# Patient Record
Sex: Female | Born: 2000 | Hispanic: Yes | Marital: Single | Smoking: Never smoker
Health system: Southern US, Community
[De-identification: ages and names within clinical notes are randomized; demographics above are authoritative.]

## PROBLEM LIST (undated history)

## (undated) DIAGNOSIS — F909 Attention-deficit hyperactivity disorder, unspecified type: Secondary | ICD-10-CM

## (undated) DIAGNOSIS — T7840XA Allergy, unspecified, initial encounter: Secondary | ICD-10-CM

## (undated) DIAGNOSIS — F845 Asperger's syndrome: Secondary | ICD-10-CM

## (undated) DIAGNOSIS — F209 Schizophrenia, unspecified: Secondary | ICD-10-CM

## (undated) HISTORY — DX: Morbid (severe) obesity due to excess calories: E66.01

## (undated) HISTORY — DX: Asperger's syndrome: F84.5

## (undated) HISTORY — DX: Allergy, unspecified, initial encounter: T78.40XA

## (undated) HISTORY — DX: Schizophrenia, unspecified: F20.9

---
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
## 2020-01-05 ENCOUNTER — Other Ambulatory Visit: Payer: Self-pay

## 2020-01-05 ENCOUNTER — Ambulatory Visit (INDEPENDENT_AMBULATORY_CARE_PROVIDER_SITE_OTHER): Payer: Medicaid Other | Admitting: Family Medicine

## 2020-01-05 VITALS — Wt 215.0 lb

## 2020-01-05 DIAGNOSIS — F900 Attention-deficit hyperactivity disorder, predominantly inattentive type: Secondary | ICD-10-CM | POA: Diagnosis not present

## 2020-01-05 NOTE — Progress Notes (Signed)
Established Patient Office Visit  Subjective:  Patient ID: Felicia Martinez, female    DOB: Jun 05, 2001  Age: 19 y.o. MRN: [id number]  CC:  Chief Complaint  Patient presents with  . Referral    ADHD    HPI  Patient is a very pleasant 19 year old Hispanic female who presents today requesting a referral to a psychiatrist for ADHD.  She states that she has had trouble with focus her entire life.  She states that while she was in school she would become easily distracted.  She was spent most of her time in class daydreaming.  She would have a very difficult time paying attention to anything except items she was interested in.  Classes such as chemistry or English she found impossible to focus.  She also states that she would frequently interrupt her friends while they were talking.  She had a very difficult time waiting her turn.  She would interrupt the conversation and was unable to focus on what they were saying and would become distracted if she was not engrossed in the conversation.  She reports trouble with organization.  This morning she was almost late to her appointment because she had lost her wallet and her keys.  She frequently misplaces items.  She has a difficult time sustaining focus in task that required prolonged attention.  When she was younger she had trouble with hyperactivity.  She has difficult time sitting still.  She would often be in trouble for talking during class.  She would often raise her hand and blurt out answers without being called.  As she has matured, the hyperactivity has improved although she does report trouble controlling her energy at time.  Previously she has been diagnosed with schizophrenia however she questions this diagnosis.  She denies any history of auditory or visual hallucinations or delusions or paranoia.  She is seeing a therapist for bipolar disorder.  Her symptoms primarily included depression although she did have trouble with impulsivity and  insomnia and mood swings. Past Medical History:  Diagnosis Date  . Allergy   . Asthma   . Morbid obesity (HCC)   . 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  Family History  Problem Relation Age of Onset  . Hypertension Mother   . Lung cancer Maternal Grandmother     Social History   Socioeconomic History  . Marital status: Single    Spouse name: Not on file  . Number of children: Not on file  . Years of education: Not on file  . Highest education level: Not on file  Occupational History  . Not on file  Tobacco Use  . Smoking status: Never Smoker  . Smokeless tobacco: Never Used  Vaping Use  . Vaping Use: Never used  Substance and Sexual Activity  . Alcohol use: Yes    Comment: occasional  . Drug use: No  . Sexual activity: Yes    Birth control/protection: Condom    Comment: menarch age 12.  Lives with mom.  Parents separated.  Other Topics Concern  . Not on file  Social History Narrative   In 9th grade at [employer]. She is performing good in school.  No regular exercise.  More than 3 hours of screen time per day.  Enjoys watching netflix, writing poetry and talking to friends.    Social Determinants of Health   Financial Resource Strain:   . Difficulty of Paying Living Expenses:   Food  Insecurity: Food Insecurity Present  . Worried About Programme researcher, broadcasting/film/video in the Last Year: Sometimes true  . Ran Out of Food in the Last Year: Sometimes true  Transportation Needs: No Transportation Needs  . Lack of Transportation (Medical): No  . Lack of Transportation (Non-Medical): No  Physical Activity: Inactive  . Days of Exercise per Week: 0 days  . Minutes of Exercise per Session: 0 min  Stress: Stress Concern Present  . Feeling of Stress : Very much  Social Connections: Socially Isolated  . Frequency of Communication with Friends and Family: Twice a week  . Frequency of Social Gatherings with Friends and Family: Once a week  .  Attends Religious Services: Never  . Active Member of Clubs or Organizations: No  . Attends Banker Meetings: Never  . Marital Status: Never married  Intimate Partner Violence: Not At Risk  . Fear of Current or Ex-Partner: No  . Emotionally Abused: No  . Physically Abused: No  . Sexually Abused: No    Outpatient Medications Prior to Visit  Medication Sig Dispense Refill  . buPROPion (WELLBUTRIN XL) 150 MG 24 hr tablet Take 150 mg by mouth daily.    . Cholecalciferol (VITAMIN D) 50 MCG (2000 UT) CAPS Take by mouth.     . clindamycin (CLEOCIN T) 1 % external solution Apply topically 2 (two) times daily. **NEED OFFICE APPT FOR FURTHER REFILLS** 30 mL 0  . fluticasone (FLONASE) 50 MCG/ACT nasal spray Place 2 sprays into both nostrils daily. 16 g 6  . LAMICTAL 100 MG tablet Take 50 mg by mouth daily.     Facility-Administered Medications Prior to Visit  Medication Dose Route Frequency Provider Last Rate Last Admin  . medroxyPROGESTERone (DEPO-PROVERA) injection 150 mg  150 mg Intramuscular Q90 days Donita Brooks, MD   150 mg at 04/27/19 0815    Allergies  Allergen Reactions  . Other     Seasonal Allergies-pollen    ROS Review of Systems  All other systems reviewed and are negative.     Objective:    Physical Exam Constitutional:      Appearance: She is well-developed.  HENT:     Head: Normocephalic.  Eyes:     Pupils: Pupils are equal, round, and reactive to light.  Cardiovascular:     Rate and Rhythm: Normal rate and regular rhythm.  Pulmonary:     Effort: Pulmonary effort is normal.     Breath sounds: Normal breath sounds.  Abdominal:     General: Bowel sounds are normal.     Palpations: Abdomen is soft.     Tenderness: There is no abdominal tenderness.        Assessment & Plan:   ADHD (attention deficit hyperactivity disorder), inattentive type  Patient symptoms certainly sound consistent with ADHD.  However given her complicated  psychiatric history with possible schizophrenia and bipolar disorder, I have recommended referral to a psychiatrist so that any treatment does not interfere with the treatment of her bipolar.  Therefore I will place a referral to psychiatry.   Donita Brooks, MD

## 2020-01-23 ENCOUNTER — Telehealth: Payer: Self-pay | Admitting: Family Medicine

## 2020-01-23 DIAGNOSIS — F330 Major depressive disorder, recurrent, mild: Secondary | ICD-10-CM | POA: Diagnosis not present

## 2020-01-23 NOTE — Telephone Encounter (Signed)
CB# [phone] Pt was seen on  01-05-20 Dr.Pickard going send over a referral to psychiatrist for ADHD haven't heard from anyone

## 2020-01-23 NOTE — Telephone Encounter (Signed)
Pt referral was placed with [facility] in [place]

## 2020-01-25 ENCOUNTER — Telehealth (HOSPITAL_COMMUNITY): Payer: Self-pay | Admitting: *Deleted

## 2020-01-25 NOTE — Telephone Encounter (Signed)
New patient referral received and staff called patient number on file and LMOM.

## 2020-03-07 ENCOUNTER — Encounter (HOSPITAL_COMMUNITY): Payer: Self-pay | Admitting: Psychiatry

## 2020-03-07 ENCOUNTER — Other Ambulatory Visit: Payer: Self-pay

## 2020-03-07 ENCOUNTER — Telehealth (INDEPENDENT_AMBULATORY_CARE_PROVIDER_SITE_OTHER): Payer: Medicaid Other | Admitting: Psychiatry

## 2020-03-07 DIAGNOSIS — G4700 Insomnia, unspecified: Secondary | ICD-10-CM

## 2020-03-07 DIAGNOSIS — R41840 Attention and concentration deficit: Secondary | ICD-10-CM

## 2020-03-07 DIAGNOSIS — F330 Major depressive disorder, recurrent, mild: Secondary | ICD-10-CM

## 2020-03-07 MED ORDER — BUPROPION HCL ER (XL) 150 MG PO TB24: 150.0000 mg | ORAL_TABLET | Freq: Every day | ORAL | 1 refills | Status: DC

## 2020-03-07 NOTE — Patient Instructions (Signed)
1. Start bupropion 150 mg daily  2. Referral for sleep evaluation 3. Check blood test (TSH), urine test (Urine drug screen) 4. Next appointment: 10/21 at 11 AM

## 2020-03-07 NOTE — Progress Notes (Signed)
Virtual Visit via Video Note  I connected with Felicia CairoEvelyn M Martinez on 03/07/20 at 10:40 AM EDT by a video enabled telemedicine application and verified that I am speaking with the correct person using two identifiers.   I discussed the limitations of evaluation and management by telemedicine and the availability of in person appointments. The patient expressed understanding and agreed to proceed.   I discussed the assessment and treatment plan with the patient. The patient was provided an opportunity to ask questions and all were answered. The patient agreed with the plan and demonstrated an understanding of the instructions.   The patient was advised to call back or seek an in-person evaluation if the symptoms worsen or if the condition fails to improve as anticipated.  Location: patient- home, provider- office   I provided 45 minutes of non-face-to-face time during this encounter.   Neysa Hottereina Jaydyn Menon, MD     Psychiatric Initial Adult Assessment   Patient Identification: Felicia Martinez MRN:  [id number] Date of Evaluation:  03/07/2020 Referral Source: Donita BrooksPickard, Warren T, MD Chief Complaint:  "It's hard to keep my head above water" Visit Diagnosis: No diagnosis found.  History of Present Illness:   Felicia Martinez is a 19 y.o. year old female with a history of bipolar disorder, who is referred for ADHD.   She states that "It's hard to keep my head above water."  She has had significant problems with focus and organization.  She has a day treatment while teacher is saying something.  She has had struggle to get motivation to do things.  She also states that she can still get overstimulated in class.  On the other hand, she tends to get very excited in certain topic, and becomes talkative, "hyper," and interrupts other people in group session, although she immediately apologizes about her behavior. She states that she has long history of "manic depression." Her medication was discontinued  about a month ago when she could not attend to the appointment. Although her medication was helpful to keep her "leveled," she recalls that she still had issues with concentration. Although her describes her self as "positive person" in nature and she knows that "there is hope for me," she feels stressed and anxious due to these issues. She reports good relationship with her boyfriend, and her parents (they are separated). She feels "bad" and denies recent hypomanic episode.   Depression- Although she denies feeling depressed, she reports mild anhedonia, and wants to stay in the bed at times.  She has low energy. She has initial and middle insomnia. She feels fatigue. She denies SI.   Bipolar- history of random burst of energy of clean the entire house in the house, it only lasted for a brief moment, she has impulsive shopping (to make her feel excited). She is talkative, and irritable at times.   ADHD- She is able to focus better on topics which she is interested. She was taking honors in high school despite issues with concentration. She denies IEP. Other symptoms as described above.   Medication- bupropion 150 mg daily, lamotrigine 50 mg daily (until last month)  Associated Signs/Symptoms: Depression Symptoms:  anhedonia, insomnia, fatigue, difficulty concentrating, (Hypo) Manic Symptoms:  Distractibility, Elevated Mood, Impulsivity, Irritable Mood, Anxiety Symptoms:  mild anxiety Psychotic Symptoms:  denies AH, VH, paranoia PTSD Symptoms: Negative  Past Psychiatric History:  [facility]patient[facility], last in January 2021, diagnosed with "manic depression" in middle school Psychiatry admission: denies  Previous suicide attempt: denies  Past trials  of medication: Abilify (weight gain) Trazodone  History of violence: denies   Previous Psychotropic Medications: Yes   Substance Abuse History in the last 12 months:  No.  Consequences of Substance Abuse: NA  Past Medical History:   Past Medical History:  Diagnosis Date  . Allergy   . Asthma   . Morbid obesity (HCC)   . 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  Family Psychiatric History:  As below  Family History:  Family History  Problem Relation Age of Onset  . Hypertension Mother   . Anxiety disorder Mother   . Depression Mother   . Lung cancer Maternal Grandmother   . Schizophrenia Maternal Aunt     Social History:   Social History   Socioeconomic History  . Marital status: Single    Spouse name: Not on file  . Number of children: Not on file  . Years of education: Not on file  . Highest education level: Not on file  Occupational History  . Not on file  Tobacco Use  . Smoking status: Never Smoker  . Smokeless tobacco: Never Used  Vaping Use  . Vaping Use: Never used  Substance and Sexual Activity  . Alcohol use: Yes    Comment: occasional  . Drug use: No  . Sexual activity: Yes    Birth control/protection: Condom    Comment: menarch age 75.  Lives with mom.  Parents separated.  Other Topics Concern  . Not on file  Social History Narrative   In 9th grade at [employer]. She is performing good in school.  No regular exercise.  More than 3 hours of screen time per day.  Enjoys watching netflix, writing poetry and talking to friends.    Social Determinants of Health   Financial Resource Strain:   . Difficulty of Paying Living Expenses: Not on file  Food Insecurity: Food Insecurity Present  . Worried About Programme researcher, broadcasting/film/video in the Last Year: Sometimes true  . Ran Out of Food in the Last Year: Sometimes true  Transportation Needs: No Transportation Needs  . Lack of Transportation (Medical): No  . Lack of Transportation (Non-Medical): No  Physical Activity: Inactive  . Days of Exercise per Week: 0 days  . Minutes of Exercise per Session: 0 min  Stress: Stress Concern Present  . Feeling of Stress : Very much  Social Connections: Socially Isolated   . Frequency of Communication with Friends and Family: Twice a week  . Frequency of Social Gatherings with Friends and Family: Once a week  . Attends Religious Services: Never  . Active Member of Clubs or Organizations: No  . Attends Banker Meetings: Never  . 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 Took honors in high school She grew up in [place], "she describes her childhood as "nice childhood though poor." Her parents are from [place] and separated. She has good relationship with both of her parents  Allergies:   Allergies  Allergen Reactions  . Other     Seasonal Allergies-pollen    Metabolic Disorder Labs: Lab Results  Component Value Date  HGBA1C 5.6 10/18/2019   MPG 114 10/18/2019   Lab Results  Component Value Date   PROLACTIN 8.8 01/17/2015   Lab Results  Component Value Date   CHOL 169 10/18/2019   TRIG 153 (H) 10/18/2019   HDL 44 (L) 10/18/2019   CHOLHDL 3.8 10/18/2019   VLDL 35 (H) 08/02/2015   LDLCALC 100 10/18/2019   LDLCALC 110 (H) 08/02/2015   Lab Results  Component Value Date   TSH 1.067 01/17/2015    Therapeutic Level Labs: No results found for: LITHIUM No results found for: CBMZ No results found for: VALPROATE  Current Medications: Current Outpatient Medications  Medication Sig Dispense Refill  . buPROPion (WELLBUTRIN XL) 150 MG 24 hr tablet Take 150 mg by mouth daily.    . Cholecalciferol (VITAMIN D) 50 MCG (2000 UT) CAPS Take by mouth.     . clindamycin (CLEOCIN T) 1 % external solution Apply topically 2 (two) times daily. **NEED OFFICE APPT FOR  FURTHER REFILLS** 30 mL 0  . fluticasone (FLONASE) 50 MCG/ACT nasal spray Place 2 sprays into both nostrils daily. 16 g 6  . LAMICTAL 100 MG tablet Take 50 mg by mouth daily.     Current Facility-Administered Medications  Medication Dose Route Frequency Provider Last Rate Last Admin  . medroxyPROGESTERone (DEPO-PROVERA) injection 150 mg  150 mg Intramuscular Q90 days Donita Brooks, MD   150 mg at 04/27/19 0815    Musculoskeletal: Strength & Muscle Tone: N/A Gait & Station: N/A Patient leans: N/A  Psychiatric Specialty Exam: Review of Systems  Psychiatric/Behavioral: Positive for decreased concentration and sleep disturbance. Negative for agitation, behavioral problems, confusion, dysphoric mood, hallucinations, self-injury and suicidal ideas. The patient is nervous/anxious. The patient is not hyperactive.   All other systems reviewed and are negative.   There were no vitals taken for this visit.There is no height or weight on file to calculate BMI.  General Appearance: Fairly Groomed  Eye Contact:  Good  Speech:  Clear and Coherent  Volume:  Normal  Mood:  "bad"  Affect:  Appropriate, Congruent and euthymic  Thought Process:  Coherent  Orientation:  Full (Time, Place, and Person)  Thought Content:  Logical  Suicidal Thoughts:  No  Homicidal Thoughts:  No  Memory:  Immediate;   Good  Judgement:  Good  Insight:  Good  Psychomotor Activity:  Normal  Concentration:  Concentration: Good and Attention Span: Good  Recall:  Good  Fund of Knowledge:Good  Language: Good  Akathisia:  No  Handed:  Right  AIMS (if indicated):  not done  Assets:  Communication Skills Desire for Improvement  ADL's:  Intact  Cognition: WNL  Sleep:  Poor   Screenings: GAD-7     Office Visit from 12/21/2019 in [facility]  Total GAD-7 Score 5    PHQ2-9     Office Visit from 12/21/2019 in [facility] Office Visit from 08/29/2018 in [facility] Office Visit  from 08/27/2017 in [facility]  PHQ-2 Total Score 2 0 2  PHQ-9 Total Score 14 -- 5      Assessment and Plan:  SAIDA LONON is a 19 y.o. year old female with a history of bipolar disorder, who is referred for ADHD.   1. MDD (major depressive disorder), recurrent episode, mild (HCC) # r/o bipolar II disorder She reports anhedonia with low energy since discontinuation of lamotrigine and bupropion. Psychosocial stressors includes school.  Will reinitiate bupropion to target depression.  Discussed potential risk of headache, and medication induced mania.  She agrees to contact the clinic if she experiences any side effect.  Noted that she only has subthreshold hypomanic symptoms, which could be more attributable to manic defense rather than bipolar disorder.  Will continue to monitor. Will check TSH to rule out medical cause contributing to her mood symptoms.   2. Inattention Although she does have features of ADHD, it could be multifactorial given her mood symptoms and insomnia.  She agrees to first optimize treatment for depression as described above.  She agrees to obtain UDS in preparation for potential treatment with stimulant in the future.   3. Insomnia, unspecified type She reports initial and middle insomnia, fatigue and snoring at night.  Will make referral for sleep evaluation.   Plan 1. Start bupropion 150 mg daily  2. Referral for sleep evaluation 3. Check TSH,  UDS 4. Next appointment: 10/21 at 11 AM for 30 mins, video  The patient demonstrates the following risk factors for suicide: Chronic risk factors for suicide include: psychiatric disorder of depression. Acute risk factors for suicide include: N/A. Protective factors for this patient include: positive social support, coping skills and hope for the future. Considering these factors, the overall suicide risk at this point appears to be low. Patient is appropriate for outpatient follow up.   Neysa Hotter,  MD 9/9/202111:14 AM

## 2020-03-08 LAB — DRUG MONITOR, PANEL 1, SCREEN, URINE
Amphetamines: NEGATIVE ng/mL (ref <500)
Barbiturates: NEGATIVE ng/mL (ref <300)
Benzodiazepines: NEGATIVE ng/mL (ref <100)
Cocaine Metabolite: NEGATIVE ng/mL (ref <150)
Creatinine: 156.5 mg/dL
Marijuana Metabolite: NEGATIVE ng/mL (ref <20)
Methadone Metabolite: NEGATIVE ng/mL (ref <100)
Opiates: NEGATIVE ng/mL (ref <100)
Oxidant: NEGATIVE ug/mL
Oxycodone: NEGATIVE ng/mL (ref <100)
Phencyclidine: NEGATIVE ng/mL (ref <25)
pH: 5.5 (ref 4.5–9.0)

## 2020-03-08 LAB — TSH: TSH: 1.3 mIU/L

## 2020-03-08 LAB — DM TEMPLATE

## 2020-03-11 ENCOUNTER — Encounter (HOSPITAL_COMMUNITY): Payer: Self-pay | Admitting: Psychiatry

## 2020-04-11 ENCOUNTER — Ambulatory Visit: Payer: Medicaid Other | Admitting: Neurology

## 2020-04-11 ENCOUNTER — Other Ambulatory Visit: Payer: Self-pay

## 2020-04-11 ENCOUNTER — Encounter: Payer: Self-pay | Admitting: Neurology

## 2020-04-11 VITALS — BP 106/78 | HR 99 | Ht 61.0 in | Wt 218.0 lb

## 2020-04-11 DIAGNOSIS — R5383 Other fatigue: Secondary | ICD-10-CM | POA: Diagnosis not present

## 2020-04-11 DIAGNOSIS — R0683 Snoring: Secondary | ICD-10-CM | POA: Diagnosis not present

## 2020-04-11 DIAGNOSIS — R519 Headache, unspecified: Secondary | ICD-10-CM

## 2020-04-11 DIAGNOSIS — G4700 Insomnia, unspecified: Secondary | ICD-10-CM

## 2020-04-11 NOTE — Patient Instructions (Signed)

## 2020-04-11 NOTE — Progress Notes (Signed)
Virtual Visit via Video Note  I connected with Felicia Martinez on 04/18/20 at 11:00 AM EDT by a video enabled telemedicine application and verified that I am speaking with the correct person using two identifiers.  Location: Patient: home Provider: office   I discussed the limitations of evaluation and management by telemedicine and the availability of in person appointments. The patient expressed understanding and agreed to proceed.    I discussed the assessment and treatment plan with the patient. The patient was provided an opportunity to ask questions and all were answered. The patient agreed with the plan and demonstrated an understanding of the instructions.   The patient was advised to call back or seek an in-person evaluation if the symptoms worsen or if the condition fails to improve as anticipated.  I provided 22 minutes of non-face-to-face time during this encounter.   Neysa Hotter, MD    [facility] MD/PA/NP OP Progress Note  04/18/2020 11:31 AM Felicia Martinez  MRN:  [id number]  Chief Complaint:  Chief Complaint    Depression; Follow-up     HPI:  This is a follow-up appointment for depression.  She states that she noticed she is not sad, or emotional so much since starting bupropion.  Although she used to think that she has no friends, she notices that she actually does have a friend, although she does not have many.  She states that her boyfriend is very understanding, and has been helpful to the patient.  She has been stressed as she is trying to figure out which classes to take with the plan to transfer to [place].  She has middle insomnia.  She has mild anhedonia.  She feels slightly depressed.  She denies SI.  She has difficulty in concentration.  She has difficulty keeping track of the time, and has been late for school. She forgets whether she took medication, and she will ask her boyfriend. She is unable to sustain attention, and "zone out"during conversation.  She tends to interrupt other people. She denies decreased need for sleep, euphoria or increased goal directed activity. She drinks energy drink, which calms her down (she agrees to hold this while trying Concerta). She denies alcohol use or drug use.     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 Hope to become a Runner, broadcasting/film/video. Took honors in high school She grew up in [place], "she describes her childhood as "nice childhood though poor." Her parents are from [place] and separated. She has good relationship with both of her parents  Visit Diagnosis:    ICD-10-CM   1. MDD (major depressive disorder), recurrent episode, mild (HCC)  F33.0   2. Attention deficit hyperactivity disorder (ADHD), unspecified ADHD type  F90.9     Past Psychiatric History: Please see initial evaluation for full details. I have reviewed the history. No updates at this time.     Past Medical History:  Past Medical History:  Diagnosis Date  . Allergy   . Asthma   . Morbid obesity (HCC)   . 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  Family Psychiatric History: Please see initial evaluation for full details. I have reviewed the history. No updates at this time.  Family History:  Family History  Problem Relation Age of Onset  . Hypertension Mother   . Anxiety disorder Mother   . Depression Mother   . Lung cancer Maternal Grandmother   . Schizophrenia Maternal Aunt     Social History:  Social History   Socioeconomic History  . Marital status: Single    Spouse name: Not on file  . Number of children: Not on file  . Years of education: Not on file  . Highest education level: Not on file   Occupational History  . Not on file  Tobacco Use  . Smoking status: Never Smoker  . Smokeless tobacco: Never Used  Vaping Use  . Vaping Use: Never used  Substance and Sexual Activity  . Alcohol use: Yes    Comment: occasional  . Drug use: No  . Sexual activity: Yes    Birth control/protection: Condom    Comment: menarch age 58.  Lives with mom.  Parents separated.  Other Topics Concern  . Not on file  Social History Narrative   In 9th grade at [employer]. She is performing good in school.  No regular exercise.  More than 3 hours of screen time per day.  Enjoys watching netflix, writing poetry and talking to friends.    Social Determinants of Health   Financial Resource Strain:   . Difficulty of Paying Living Expenses: Not on file  Food Insecurity: Food Insecurity Present  . Worried About Programme researcher, broadcasting/film/video in the Last Year: Sometimes true  . Ran Out of Food in the Last Year: Sometimes true  Transportation Needs: No Transportation Needs  . Lack of Transportation (Medical): No  . Lack of Transportation (Non-Medical): No  Physical Activity: Inactive  . Days of Exercise per Week: 0 days  . Minutes of Exercise per Session: 0 min  Stress: Stress Concern Present  . Feeling of Stress : Very much  Social Connections: Socially Isolated  . Frequency of Communication with Friends and Family: Twice a week  . Frequency of Social Gatherings with Friends and Family: Once a week  . Attends Religious Services: Never  . Active Member of Clubs or Organizations: No  . Attends Banker Meetings: Never  . Marital Status: Never married    Allergies:  Allergies  Allergen Reactions  . Other     Seasonal Allergies-pollen    Metabolic Disorder Labs: Lab Results  Component Value Date   HGBA1C 5.6 10/18/2019   MPG 114 10/18/2019   Lab Results  Component Value Date   PROLACTIN 8.8 01/17/2015   Lab Results  Component Value Date   CHOL 169 10/18/2019   TRIG  153 (H) 10/18/2019   HDL 44 (L) 10/18/2019   CHOLHDL 3.8 10/18/2019   VLDL 35 (H) 08/02/2015   LDLCALC 100 10/18/2019   LDLCALC 110 (H) 08/02/2015   Lab Results  Component Value Date   TSH 1.30 03/07/2020   TSH 1.067 01/17/2015    Therapeutic Level Labs: No results found for: LITHIUM No results found for: VALPROATE No components found for:  CBMZ  Current Medications: Current Outpatient Medications  Medication Sig Dispense Refill  . [START ON 05/05/2020] buPROPion (WELLBUTRIN XL) 150 MG 24 hr tablet Take 1 tablet (150 mg total) by mouth daily. 30 tablet 0  . fluticasone (FLONASE) 50 MCG/ACT nasal spray Place 2 sprays into both nostrils daily. 16 g 6  . methylphenidate (CONCERTA) 18 MG PO CR tablet Take 1 tablet (18 mg total) by mouth  daily. 30 tablet 0   Current Facility-Administered Medications  Medication Dose Route Frequency Provider Last Rate Last Admin  . medroxyPROGESTERone (DEPO-PROVERA) injection 150 mg  150 mg Intramuscular Q90 days Donita BrooksPickard, Warren T, MD   150 mg at 04/27/19 0815     Musculoskeletal: Strength & Muscle Tone: N/A Gait & Station: N/A Patient leans: N/A  Psychiatric Specialty Exam: Review of Systems  Psychiatric/Behavioral: Positive for decreased concentration, dysphoric mood and sleep disturbance. Negative for agitation, behavioral problems, confusion, hallucinations, self-injury and suicidal ideas. The patient is not nervous/anxious and is not hyperactive.   All other systems reviewed and are negative.   There were no vitals taken for this visit.There is no height or weight on file to calculate BMI.  General Appearance: Casual  Eye Contact:  Good  Speech:  Clear and Coherent  Volume:  Normal  Mood:  "same"  Affect:  Appropriate, Congruent and euthymic  Thought Process:  Coherent  Orientation:  Full (Time, Place, and Person)  Thought Content: Logical   Suicidal Thoughts:  No  Homicidal Thoughts:  No  Memory:  Immediate;   Good  Judgement:   Good  Insight:  Good  Psychomotor Activity:  Normal  Concentration:  Concentration: Good and Attention Span: Good  Recall:  Good  Fund of Knowledge: Good  Language: Good  Akathisia:  No  Handed:  Right  AIMS (if indicated): not done  Assets:  Communication Skills Desire for Improvement  ADL's:  Intact  Cognition: WNL  Sleep:  Fair   Screenings: GAD-7     [facility] Visit [facility]  Total GAD-7 Score 5    PHQ2-9     Office Visit [facility]/21/2019 in [facility] Office Visit from [facility]/29/2018 in [facility]  PHQ-2 Total Score 2 0 2  PHQ-9 Total Score 14 -- 5       Assessment and Plan:  Felicia Martinez is a 19 y.o. year old female with a history of bipolar disorder, who presents for follow up appointment for below.   1. MDD (major depressive disorder), recurrent episode, mild (HCC) She reports overall improvement in depressive symptoms, although she continues to have anhedonia since starting bupropion.  Psychosocial stressors include school.  Given that her main source of stress is attributable to inattention, will try a stimulant as below to see if her mood improves as her inattention improves.  Will continue bupropion to target depression.   2. Attention deficit hyperactivity disorder (ADHD), unspecified ADHD type She has symptoms of ADHD, which is refractory to improvement despite some improvement in her mood symptoms.  Will try low dose of Concerta to target ADHD.  Discussed potential risks, which includes but not limited to decreased appetite, headache, insomnia, palpitation especially with concomitant use of bupropion.   3. Insomnia, unspecified type She is seen by neurologist for evaluation of sleep apnea.  Pending sleep study.   Plan 1. Continue bupropion 150 mg daily  2. Start concerta 18 mg daily  3. Next appointment: 11/16 at 3 PM for 30 mins, video - on  melatonin 5 mg at night   Past trials of medication: Abilify (weight gain) Trazodone   The patient demonstrates the following risk factors for suicide: Chronic risk factors for suicide include: psychiatric disorder of depression. Acute risk factors for suicide include: N/A. Protective factors for this patient include: positive social support, coping skills and hope for the  future. Considering these factors, the overall suicide risk at this point appears to be low. Patient is appropriate for outpatient follow up  Neysa Hotter, MD 04/18/2020, 11:31 AM

## 2020-04-11 NOTE — Progress Notes (Signed)
Subjective:    Patient ID: Felicia Martinez is a 19 y.o. female.  HPI     Huston Foley, MD, PhD [facility] [address], [address] [address] [place], [place] [address]  Dear Dr. Vanetta Shawl,  I saw your patient, Felicia Martinez, upon your kind request in my sleep clinic today for initial consultation of her sleep disorder, in particular, concern for underlying obstructive sleep apnea.  The patient is unaccompanied today.  As you know, Ms. Felicia Martinez is a 19 year old right-handed woman with an underlying medical history of asthma, allergies, hypertriglyceridemia, ADHD, depression, and severe obesity with a BMI of over 40, who reports snoring and excessive daytime somnolence, difficulty initiating and maintaining sleep. I reviewed your virtual visit office note from 03/07/2020.  Her Epworth sleepiness score is 4 out of 24, fatigue severity score is 43 out of 63.  She reports a longer standing history of difficulty initiating and maintaining sleep.  In the past few months her difficulty has become worse.  She has also gained weight in the last year, she was on Depo injections and gained quite a bit of weight, she is off of the injection.  She lives with her boyfriend and with her mother.  Felicia Martinez snores and has difficulty with insomnia.  The patient started melatonin some 3 to 4 weeks ago and started taking the Gummies which were effective but she started having some abnormal dreams and nightmares.  She since then switched to a 5 mg melatonin pill which is not as effective but she does not have any abnormal dreams.  She tries to be in bed between 930 and 10 but is typically not asleep until midnight.  Most days she has to be up by 8 and on Fridays she can sleep then.  She is currently in [employer].  She is a non-smoker and drinks alcohol rarely.  She does not drink caffeine on a day-to-day basis, occasional soda and coffee every other day.  She does snore.  She has woken  up with the occasional headache which feels like a dull or heavy feeling.  She does not have night to night nocturia.  She is a restless sleeper and does not feel tired during the day and sometimes has trouble focusing.   Her Past Medical History Is Significant For: Past Medical History:  Diagnosis Date  . Allergy   . Asthma   . Morbid obesity (HCC)   . Schizophrenia in children     Her Past Surgical History Is Significant For: History reviewed. No pertinent surgical history.  Her Family History Is Significant For: Family History  Problem Relation Age of Onset  . Hypertension Mother   . Anxiety disorder Mother   . Depression Mother   . Lung cancer Maternal Grandmother   . Schizophrenia Maternal Aunt     Her Social History Is Significant For: Social History   Socioeconomic History  . Marital status: Single    Spouse name: Not on file  . Number of children: Not on file  . Years of education: Not on file  . Highest education level: Not on file  Occupational History  . Not on file  Tobacco Use  . Smoking status: Never Smoker  . Smokeless tobacco: Never Used  Vaping Use  . Vaping Use: Never used  Substance and Sexual Activity  . Alcohol use: Yes    Comment: occasional  . Drug use: No  . Sexual activity: Yes    Birth control/protection: Condom  Comment: menarch age 18.  Lives with Felicia Martinez.  Parents separated.  Other Topics Concern  . Not on file  Social History Narrative   In 9th grade at [employer]. She is performing good in school.  No regular exercise.  More than 3 hours of screen time per day.  Enjoys watching netflix, writing poetry and talking to friends.    Social Determinants of Health   Financial Resource Strain:   . Difficulty of Paying Living Expenses: Not on file  Food Insecurity: Food Insecurity Present  . Worried About Programme researcher, broadcasting/film/video in the Last Year: Sometimes true  . Ran Out of Food in the Last Year: Sometimes true  Transportation  Needs: No Transportation Needs  . Lack of Transportation (Medical): No  . Lack of Transportation (Non-Medical): No  Physical Activity: Inactive  . Days of Exercise per Week: 0 days  . Minutes of Exercise per Session: 0 min  Stress: Stress Concern Present  . Feeling of Stress : Very much  Social Connections: Socially Isolated  . Frequency of Communication with Friends and Family: Twice a week  . Frequency of Social Gatherings with Friends and Family: Once a week  . Attends Religious Services: Never  . Active Member of Clubs or Organizations: No  . Attends Banker Meetings: Never  . Marital Status: Never married    Her Allergies Are:  Allergies  Allergen Reactions  . Other     Seasonal Allergies-pollen  :   Her Current Medications Are:  Outpatient Encounter Medications as of 04/11/2020  Medication Sig  . buPROPion (WELLBUTRIN XL) 150 MG 24 hr tablet Take 1 tablet (150 mg total) by mouth daily.  . fluticasone (FLONASE) 50 MCG/ACT nasal spray Place 2 sprays into both nostrils daily.  . [DISCONTINUED] Cholecalciferol (VITAMIN D) 50 MCG (2000 UT) CAPS Take by mouth.   . [DISCONTINUED] clindamycin (CLEOCIN T) 1 % external solution Apply topically 2 (two) times daily. **NEED OFFICE APPT FOR FURTHER REFILLS**   Facility-Administered Encounter Medications as of 04/11/2020  Medication  . medroxyPROGESTERone (DEPO-PROVERA) injection 150 mg  :  Review of Systems:  Out of a complete 14 point review of systems, all are reviewed and negative with the exception of these symptoms as listed below Review of Systems  Neurological:       Here for sleep consult. No prior sleep study, she sts she does snore at night.  Epworth Sleepiness Scale 0= would never doze 1= slight chance of dozing 2= moderate chance of dozing 3= high chance of dozing  Sitting and reading:0 Watching TV:1 Sitting inactive in a public place (ex. Theater or meeting):0 As a passenger in a car for an hour  without a break:1 Lying down to rest in the afternoon:2 Sitting and talking to someone:0 Sitting quietly after lunch (no alcohol):0 In a car, while stopped in traffic:0 Total:4     Objective:  Neurological Exam  Physical Exam Physical Examination:   Vitals:   04/11/20 1112  BP: 106/78  Pulse: 99  SpO2: 98%    General Examination: The patient is a very pleasant 19 y.o. female in no acute distress. She appears well-developed and well-nourished and well groomed.   HEENT: Normocephalic, atraumatic, pupils are equal, round and reactive to light, extraocular tracking is good without limitation to gaze excursion or nystagmus noted. Hearing is grossly intact. Face is symmetric with normal facial animation. Speech is clear with no dysarthria noted. There is no hypophonia. There is no lip, neck/head,  jaw or voice tremor. Neck is supple with full range of passive and active motion. There are no carotid bruits on auscultation. Oropharynx exam reveals: Mild mouth dryness, good dental hygiene, mild airway crowding secondary to small airway entry, tonsils on the smaller side, Mallampati class II, neck circumference of 17 inches.  She has a minimal overbite.  Tongue protrudes centrally and palate elevates symmetrically.   Chest: Clear to auscultation without wheezing, rhonchi or crackles noted.  Heart: S1+S2+0, regular and normal without murmurs, rubs or gallops noted.   Abdomen: Soft, non-tender and non-distended with normal bowel sounds appreciated on auscultation.  Extremities: There is no pitting edema in the distal lower extremities bilaterally.   Skin: Warm and dry without trophic changes noted.   Musculoskeletal: exam reveals no obvious joint deformities, tenderness or joint swelling or erythema.   Neurologically:  Mental status: The patient is awake, alert and oriented in all 4 spheres. Her immediate and remote memory, attention, language skills and fund of knowledge are appropriate.  There is no evidence of aphasia, agnosia, apraxia or anomia. Speech is clear with normal prosody and enunciation. Thought process is linear. Mood is normal and affect is normal.  Cranial nerves II - XII are as described above under HEENT exam.  Motor exam: Normal bulk, strength and tone is noted. There is no tremor, Romberg is negative. Fine motor skills and coordination: grossly intact.  Cerebellar testing: No dysmetria or intention tremor. There is no truncal or gait ataxia.  Sensory exam: intact to light touch in the upper and lower extremities.  Gait, station and balance: She stands easily. No veering to one side is noted. No leaning to one side is noted. Posture is age-appropriate and stance is narrow based. Gait shows normal stride length and normal pace. No problems turning are noted. Tandem walk is unremarkable.                Assessment and Plan:  In summary, FRANCI OSHANA is a very pleasant 19 y.o.-year old female with an underlying medical history of asthma, allergies, hypertriglyceridemia, ADHD, depression, and severe obesity with a BMI of over 40, whose history and physical exam concerning for obstructive sleep apnea (OSA). I had a long chat with the patient about my findings and the diagnosis of OSA, its prognosis and treatment options. We talked about medical treatments, surgical interventions and non-pharmacological approaches. I explained in particular the risks and ramifications of untreated moderate to severe OSA, especially with respect to developing cardiovascular disease down the Road, including congestive heart failure, difficult to treat hypertension, cardiac arrhythmias, or stroke. Even type 2 diabetes has, in part, been linked to untreated OSA. Symptoms of untreated OSA include daytime sleepiness, memory problems, mood irritability and mood disorder such as depression and anxiety, lack of energy, as well as recurrent headaches, especially morning headaches. We talked about  trying to maintain a healthy lifestyle in general, as well as the importance of weight control. We also talked about the importance of good sleep hygiene. I recommended the following at this time: sleep study.  I explained the sleep test procedure to the patient and also outlined possible surgical and non-surgical treatment options of OSA, including the use of a custom-made dental device (which would require a referral to a specialist dentist or oral surgeon), upper airway surgical options, such as traditional UPPP or a novel less invasive surgical option in the form of Inspire hypoglossal nerve stimulation (which would involve a referral to an ENT surgeon). I  also explained the CPAP treatment option to the patient, who indicated that she would be willing to try CPAP if the need arises. I explained the importance of being compliant with PAP treatment, not only for insurance purposes but primarily to improve Her symptoms, and for the patient's long term health benefit, including to reduce Her cardiovascular risks. I answered all her questions today and the patient was in agreement. I plan to see her back after the sleep study is completed and encouraged her to call with any interim questions, concerns, problems or updates.   Thank you very much for allowing me to participate in the care of this nice patient. If I can be of any further assistance to you please do not hesitate to call me at [phone].  Sincerely,   Star Age, MD, PhD

## 2020-04-18 ENCOUNTER — Other Ambulatory Visit: Payer: Self-pay

## 2020-04-18 ENCOUNTER — Telehealth (INDEPENDENT_AMBULATORY_CARE_PROVIDER_SITE_OTHER): Payer: Medicaid Other | Admitting: Psychiatry

## 2020-04-18 ENCOUNTER — Encounter (HOSPITAL_COMMUNITY): Payer: Self-pay | Admitting: Psychiatry

## 2020-04-18 DIAGNOSIS — F330 Major depressive disorder, recurrent, mild: Secondary | ICD-10-CM | POA: Diagnosis not present

## 2020-04-18 DIAGNOSIS — F909 Attention-deficit hyperactivity disorder, unspecified type: Secondary | ICD-10-CM | POA: Diagnosis not present

## 2020-04-18 MED ORDER — BUPROPION HCL ER (XL) 150 MG PO TB24: 150.0000 mg | ORAL_TABLET | Freq: Every day | ORAL | 0 refills | Status: DC

## 2020-04-18 MED ORDER — METHYLPHENIDATE HCL ER (OSM) 18 MG PO TBCR: 18.0000 mg | EXTENDED_RELEASE_TABLET | Freq: Every day | ORAL | 0 refills | Status: DC

## 2020-04-18 NOTE — Patient Instructions (Signed)
1. Continue bupropion 150 mg daily  2. Start concerta 18 mg daily  3. Next appointment: 11/16 at 3 PM

## 2020-05-06 NOTE — Progress Notes (Signed)
Virtual Visit via Video Note  I connected with Felicia Martinez on 05/14/20 at  3:00 PM EST by a video enabled telemedicine application and verified that I am speaking with the correct person using two identifiers.  Location: Patient: home Provider: office   I discussed the limitations of evaluation and management by telemedicine and the availability of in person appointments. The patient expressed understanding and agreed to proceed. I discussed the assessment and treatment plan with the patient. The patient was provided an opportunity to ask questions and all were answered. The patient agreed with the plan and demonstrated an understanding of the instructions.   The patient was advised to call back or seek an in-person evaluation if the symptoms worsen or if the condition fails to improve as anticipated.  I provided 15 minutes of non-face-to-face time during this encounter.   Neysa Hotter, MD    [facility] MD/PA/NP OP Progress Note  05/14/2020 3:30 PM Felicia Martinez  MRN:  [id number]  Chief Complaint:  Chief Complaint    Depression; Follow-up; ADHD     HPI:  This is a follow-up appointment for depression and ADHD.  She states that she tends to be sleepy, daydreaming, znning out more later in the day. She has noticed these more since starting Concerta.  She believes her attention is better since starting medication.  Her mind is not wandering anymore.  She is able to remember things without sticky note.  She has been able to take medication regularly.  She is able to do homework without forgetting. She does not procrastinate things, although she still has difficulty in staying on tasks.  She also notices that she is more engaging well.  She enjoys playing games, and going out with her boyfriend.  She does not feel hyper, and has been able to get things done.  She is willing to try higher dose of Concerta to see if it helps for fatigue later in the day.  She has fair sleep.  She denies  feeling depressed.  She denies anxiety, and denies restlessness anymore.  She tends to crave for sugar/snacks, although she denies increasing in appetite.  She agrees to check her weight regularly.  She denies SI.   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 Hope to become a Runner, broadcasting/film/video. Took honors in high school She grew up in [place], "she describes her childhood as "nice childhood though poor." Her parents are from [place] and separated. She has good relationship with both of her parents  Visit Diagnosis:    ICD-10-CM   1. Attention deficit hyperactivity disorder (ADHD), unspecified ADHD type  F90.9   2. MDD (major depressive disorder), recurrent, in partial remission (HCC)  F33.41     Past Psychiatric History: Please see initial evaluation for full details. I have reviewed the history. No updates at this time.     Past Medical History:  Past Medical History:  Diagnosis Date  . Allergy   . Asthma   . Morbid obesity (HCC)   . 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  Family Psychiatric History: Please see initial evaluation for full details. I have reviewed the history. No updates at this time.     Family History:  Family History  Problem Relation Age of  Onset  . Hypertension Mother   . Anxiety disorder Mother   . Depression Mother   . Lung cancer Maternal Grandmother   . Schizophrenia Maternal Aunt     Social History:  Social History   Socioeconomic History  . Marital status: Single    Spouse name: Not on file  . Number of children: Not on file  . Years of education: Not on file  . Highest education level: Not on file  Occupational History  . Not on file   Tobacco Use  . Smoking status: Never Smoker  . Smokeless tobacco: Never Used  Vaping Use  . Vaping Use: Never used  Substance and Sexual Activity  . Alcohol use: Yes    Comment: occasional  . Drug use: No  . Sexual activity: Yes    Birth control/protection: Condom    Comment: menarch age 36.  Lives with mom.  Parents separated.  Other Topics Concern  . Not on file  Social History Narrative   In 9th grade at [employer]. She is performing good in school.  No regular exercise.  More than 3 hours of screen time per day.  Enjoys watching netflix, writing poetry and talking to friends.    Social Determinants of Health   Financial Resource Strain:   . Difficulty of Paying Living Expenses: Not on file  Food Insecurity: Food Insecurity Present  . Worried About Programme researcher, broadcasting/film/video in the Last Year: Sometimes true  . Ran Out of Food in the Last Year: Sometimes true  Transportation Needs: No Transportation Needs  . Lack of Transportation (Medical): No  . Lack of Transportation (Non-Medical): No  Physical Activity: Inactive  . Days of Exercise per Week: 0 days  . Minutes of Exercise per Session: 0 min  Stress: Stress Concern Present  . Feeling of Stress : Very much  Social Connections: Socially Isolated  . Frequency of Communication with Friends and Family: Twice a week  . Frequency of Social Gatherings with Friends and Family: Once a week  . Attends Religious Services: Never  . Active Member of Clubs or Organizations: No  . Attends Banker Meetings: Never  . Marital Status: Never married    Allergies:  Allergies  Allergen Reactions  . Other     Seasonal Allergies-pollen    Metabolic Disorder Labs: Lab Results  Component Value Date   HGBA1C 5.6 10/18/2019   MPG 114 10/18/2019   Lab Results  Component Value Date   PROLACTIN 8.8 01/17/2015   Lab Results  Component Value Date   CHOL 169 10/18/2019   TRIG 153 (H) 10/18/2019   HDL 44 (L)  10/18/2019   CHOLHDL 3.8 10/18/2019   VLDL 35 (H) 08/02/2015   LDLCALC 100 10/18/2019   LDLCALC 110 (H) 08/02/2015   Lab Results  Component Value Date   TSH 1.30 03/07/2020   TSH 1.067 01/17/2015    Therapeutic Level Labs: No results found for: LITHIUM No results found for: VALPROATE No components found for:  CBMZ  Current Medications: Current Outpatient Medications  Medication Sig Dispense Refill  . buPROPion (WELLBUTRIN XL) 150 MG 24 hr tablet Take 1 tablet (150 mg total) by mouth daily. 30 tablet 0  . fluticasone (FLONASE) 50 MCG/ACT nasal spray Place 2 sprays into both nostrils daily. 16 g 6  . methylphenidate (CONCERTA) 18 MG PO CR tablet Take 1 tablet (18 mg total) by mouth daily. 30 tablet 0  . methylphenidate (CONCERTA) 27 MG PO CR tablet  Take 1 tablet (27 mg total) by mouth every morning. 30 tablet 0   Current Facility-Administered Medications  Medication Dose Route Frequency Provider Last Rate Last Admin  . medroxyPROGESTERone (DEPO-PROVERA) injection 150 mg  150 mg Intramuscular Q90 days Donita Brooks, MD   150 mg at 04/27/19 0815     Musculoskeletal: Strength & Muscle Tone: N/A Gait & Station: N/A Patient leans: N/A  Psychiatric Specialty Exam: Review of Systems  Psychiatric/Behavioral: Positive for decreased concentration. Negative for agitation, behavioral problems, confusion, dysphoric mood, hallucinations, self-injury, sleep disturbance and suicidal ideas. The patient is not nervous/anxious and is not hyperactive.   All other systems reviewed and are negative.   There were no vitals taken for this visit.There is no height or weight on file to calculate BMI.  General Appearance: Fairly Groomed  Eye Contact:  Good  Speech:  Clear and Coherent  Volume:  Normal  Mood:  good  Affect:  Appropriate, Congruent and calm, euthymic  Thought Process:  Coherent  Orientation:  Full (Time, Place, and Person)  Thought Content: Logical   Suicidal Thoughts:  No   Homicidal Thoughts:  No  Memory:  Immediate;   Good  Judgement:  Good  Insight:  Good  Psychomotor Activity:  Normal  Concentration:  Concentration: Good and Attention Span: Good  Recall:  Good  Fund of Knowledge: Good  Language: Good  Akathisia:  No  Handed:  Right  AIMS (if indicated): not done  Assets:  Communication Skills Desire for Improvement  ADL's:  Intact  Cognition: WNL  Sleep:  Fair   Screenings: GAD-7     Office Visit from 12/21/2019 in [facility]  Total GAD-7 Score 5    PHQ2-9     Office Visit from 12/21/2019 in [facility] Office Visit from 08/29/2018 in [facility] Office Visit from 08/27/2017 in [facility]  PHQ-2 Total Score 2 0 2  PHQ-9 Total Score 14 -- 5       Assessment and Plan:  FENDI MEINHARDT is a 19 y.o. year old female with a history of bipolar disorder, who presents for follow up appointment for below.   1. MDD (major depressive disorder), recurrent, in partial remission (HCC) There has been overall improvement in depressive symptoms since the last visit.  Psychosocial stressors include school.  We will continue bupropion to target depression.   2. Attention deficit hyperactivity disorder (ADHD), unspecified ADHD type She reports improvement in attention, although she also notices fatigue later in the day since starting Concerta.  Will uptitrate the dose of Concerta to optimize treatment of ADHD.  Discussed potential risk, which includes but not limited to headache, worsening in anxiety, palpitation, decreased appetite, dependence.   3. Insomnia, unspecified type She is seen by neurologist for evaluation of sleep apnea.  Pending sleep study.   Plan 1. Continue bupropion 150 mg daily  2. Increase concerta 27 mg daily  - monitor fatigue 3. Next appointment: 12/14 at 3:10 for 20 mins, video -pending sleep study due to medicaid  -- on melatonin 5 mg at night   Past trials of  medication:Abilify (weight gain) Trazodone  The patient demonstrates the following risk factors for suicide: Chronic risk factors for suicide include:psychiatric disorder ofdepression. Acute risk factorsfor suicide include:N/A. Protective factorsfor this patient include: positive social support, coping skills and hope for the future. Considering these factors, the overall suicide risk at this point appears to below. Patientisappropriate for outpatient follow up  Neysa Hottereina Luva Metzger, MD 05/14/2020, 3:30 PM

## 2020-05-14 ENCOUNTER — Other Ambulatory Visit: Payer: Self-pay

## 2020-05-14 ENCOUNTER — Encounter: Payer: Self-pay | Admitting: Psychiatry

## 2020-05-14 ENCOUNTER — Telehealth (HOSPITAL_COMMUNITY): Payer: Medicaid Other | Admitting: Psychiatry

## 2020-05-14 ENCOUNTER — Telehealth (INDEPENDENT_AMBULATORY_CARE_PROVIDER_SITE_OTHER): Payer: Medicaid Other | Admitting: Psychiatry

## 2020-05-14 DIAGNOSIS — F3341 Major depressive disorder, recurrent, in partial remission: Secondary | ICD-10-CM

## 2020-05-14 DIAGNOSIS — F909 Attention-deficit hyperactivity disorder, unspecified type: Secondary | ICD-10-CM | POA: Diagnosis not present

## 2020-05-14 MED ORDER — METHYLPHENIDATE HCL ER (OSM) 27 MG PO TBCR: 27.0000 mg | EXTENDED_RELEASE_TABLET | ORAL | 0 refills | Status: DC

## 2020-06-05 NOTE — Progress Notes (Signed)
Virtual Visit via Video Note  I connected with Felicia Martinez on 06/11/20 at  3:10 PM EST by a video enabled telemedicine application and verified that I am speaking with the correct person using two identifiers.  Location: Patient: home Provider: office Persons participated in the visit- patient, provider   I discussed the limitations of evaluation and management by telemedicine and the availability of in person appointments. The patient expressed understanding and agreed to proceed.    I discussed the assessment and treatment plan with the patient. The patient was provided an opportunity to ask questions and all were answered. The patient agreed with the plan and demonstrated an understanding of the instructions.   The patient was advised to call back or seek an in-person evaluation if the symptoms worsen or if the condition fails to improve as anticipated.  I provided 13 minutes of non-face-to-face time during this encounter.   Neysa Hotter, MD    [facility] MD/PA/NP OP Progress Note  06/11/2020 3:32 PM Felicia Martinez  MRN:  [id number]  Chief Complaint:  Chief Complaint    Follow-up; Depression; ADHD     HPI:  This is a follow-up appointment for depression and ADHD.  She states that she is doing well.  She had finals, and the semester is over.  She had a good time on Thanksgiving, visiting her boyfriend's mother in [place]. she is doing *100%* better after taking higher dose of Concerta.  She does not get fuzzy, and not distracted. She also recognizes that minimizing stimulation/shut off from things may get easier for her to concentrate.  She is not forgetful anymore.  She is able to complete tasks and doing multi tasks.  She feels less fatigue except that she feels a little drowsy when she wakes up in the morning.  She had insomnia; she now takes melatonin 10 mg, which has been helpful.  She is also trying not to do video games at night.  She denies feeling depressed.  She has  slightly decreased appetite, which has been good for the patient, stating that she used to overeat.  She denies anhedonia.  She denies SI.    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 Hope to become a Runner, broadcasting/film/video. Took honors in high school She grew up in [place], "she describes her childhood as "nice childhood though poor." Her parents are from [place] and separated. She has good relationship with both of her parents   Visit Diagnosis:    ICD-10-CM   1. MDD (major depressive disorder), recurrent, in full remission (HCC)  F33.42   2. Attention deficit hyperactivity disorder (ADHD), unspecified ADHD type  F90.9     Past Psychiatric History: Please see initial evaluation for full details. I have reviewed the history. No updates at this time.     Past Medical History:  Past Medical History:  Diagnosis Date  . Allergy   . Asthma   . Morbid obesity (HCC)   . 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  Family Psychiatric History: Please see initial evaluation for full details. I have reviewed the history. No updates at this time.     Family History:  Family History  Problem Relation Age of Onset  . Hypertension Mother   . Anxiety disorder  Mother   . Depression Mother   . Lung cancer Maternal Grandmother   . Schizophrenia Maternal Aunt     Social History:  Social History   Socioeconomic History  . Marital status: Single    Spouse name: Not on file  . Number of children: Not on file  . Years of education: Not on file  . Highest education level: Not on file  Occupational History  . Not on file  Tobacco Use  . Smoking status: Never Smoker  . Smokeless tobacco:  Never Used  Vaping Use  . Vaping Use: Never used  Substance and Sexual Activity  . Alcohol use: Yes    Comment: occasional  . Drug use: No  . Sexual activity: Yes    Birth control/protection: Condom    Comment: menarch age 19.  Lives with mom.  Parents separated.  Other Topics Concern  . Not on file  Social History Narrative   In 9th grade at [employer]. She is performing good in school.  No regular exercise.  More than 3 hours of screen time per day.  Enjoys watching netflix, writing poetry and talking to friends.    Social Determinants of Health   Financial Resource Strain: Not on file  Food Insecurity: Food Insecurity Present  . Worried About Programme researcher, broadcasting/film/videounning Out of Food in the Last Year: Sometimes true  . Ran Out of Food in the Last Year: Sometimes true  Transportation Needs: No Transportation Needs  . Lack of Transportation (Medical): No  . Lack of Transportation (Non-Medical): No  Physical Activity: Inactive  . Days of Exercise per Week: 0 days  . Minutes of Exercise per Session: 0 min  Stress: Stress Concern Present  . Feeling of Stress : Very much  Social Connections: Socially Isolated  . Frequency of Communication with Friends and Family: Twice a week  . Frequency of Social Gatherings with Friends and Family: Once a week  . Attends Religious Services: Never  . Active Member of Clubs or Organizations: No  . Attends BankerClub or Organization Meetings: Never  . Marital Status: Never married    Allergies:  Allergies  Allergen Reactions  . Other     Seasonal Allergies-pollen    Metabolic Disorder Labs: Lab Results  Component Value Date   HGBA1C 5.6 10/18/2019   MPG 114 10/18/2019   Lab Results  Component Value Date   PROLACTIN 8.8 01/17/2015   Lab Results  Component Value Date   CHOL 169 10/18/2019   TRIG 153 (H) 10/18/2019   HDL 44 (L) 10/18/2019   CHOLHDL 3.8 10/18/2019   VLDL 35 (H) 08/02/2015   LDLCALC 100 10/18/2019   LDLCALC 110 (H) 08/02/2015    Lab Results  Component Value Date   TSH 1.30 03/07/2020   TSH 1.067 01/17/2015    Therapeutic Level Labs: No results found for: LITHIUM No results found for: VALPROATE No components found for:  CBMZ  Current Medications: Current Outpatient Medications  Medication Sig Dispense Refill  . buPROPion (WELLBUTRIN XL) 150 MG 24 hr tablet Take 1 tablet (150 mg total) by mouth daily. 90 tablet 0  . fluticasone (FLONASE) 50 MCG/ACT nasal spray Place 2 sprays into both nostrils daily. 16 g 6  . methylphenidate (CONCERTA) 27 MG PO CR tablet Take 1 tablet (27 mg total) by mouth every morning. 30 tablet 0  . methylphenidate (CONCERTA) 27 MG PO CR tablet Take 1 tablet (27 mg total) by mouth daily before breakfast. 30 tablet 0  . [START  ON 07/11/2020] methylphenidate (CONCERTA) 27 MG PO CR tablet Take 1 tablet (27 mg total) by mouth daily before breakfast. 30 tablet 0  . [START ON 08/10/2020] methylphenidate (CONCERTA) 27 MG PO CR tablet Take 1 tablet (27 mg total) by mouth daily before breakfast. 30 tablet 0   Current Facility-Administered Medications  Medication Dose Route Frequency Provider Last Rate Last Admin  . medroxyPROGESTERone (DEPO-PROVERA) injection 150 mg  150 mg Intramuscular Q90 days Donita Brooks, MD   150 mg at 04/27/19 0815     Musculoskeletal: Strength & Muscle Tone: N/A Gait & Station: N/A Patient leans: N/A  Psychiatric Specialty Exam: Review of Systems  Psychiatric/Behavioral: Positive for sleep disturbance. Negative for agitation, behavioral problems, confusion, decreased concentration, dysphoric mood, hallucinations, self-injury and suicidal ideas. The patient is not nervous/anxious and is not hyperactive.   All other systems reviewed and are negative.   There were no vitals taken for this visit.There is no height or weight on file to calculate BMI.  General Appearance: Fairly Groomed  Eye Contact:  Good  Speech:  Clear and Coherent  Volume:  Normal  Mood:   good  Affect:  Appropriate, Congruent and euthymic  Thought Process:  Coherent  Orientation:  Full (Time, Place, and Person)  Thought Content: Logical   Suicidal Thoughts:  No  Homicidal Thoughts:  No  Memory:  Immediate;   Good  Judgement:  Good  Insight:  Good  Psychomotor Activity:  Normal  Concentration:  Concentration: Good and Attention Span: Good  Recall:  Good  Fund of Knowledge: Good  Language: Good  Akathisia:  No  Handed:  Right  AIMS (if indicated): not done  Assets:  Communication Skills Desire for Improvement  ADL's:  Intact  Cognition: WNL  Sleep:  Fair   Screenings: GAD-7   Flowsheet Row Office Visit from 12/21/2019 in [facility]  Total GAD-7 Score 5    PHQ2-9   Flowsheet Row Office Visit from 12/21/2019 in [facility] Office Visit from 08/29/2018 in [facility] Office Visit from 08/27/2017 in [facility]  PHQ-2 Total Score 2 0 2  PHQ-9 Total Score 14 -- 5       Assessment and Plan:  Felicia Martinez is a 19 y.o. year old female with a history of  bipolar disorder, who presents for follow up appointment for below.   1. MDD (major depressive disorder), recurrent, in full remission (HCC) She denies any significant mood symptoms except occasional irritability since the last visit.  We will continue bupropion to target depression.   2. Attention deficit hyperactivity disorder (ADHD), unspecified ADHD type She reports significant improvement in inattention and fatigue since up titration of Concerta.  Will continue current dose of Concerta to target ADHD.  She is aware of its potential risk which includes but not limited to headache, worsening in anxiety, palpitation, decreased appetite and dependence.   3. Insomnia, unspecified type Coached sleep hygiene. She reports benefit from higher dose of melatonin. She is seen by neurologist for evaluation of sleep apnea.Pending home sleep  study.  Plan 1.Continuebupropion 150 mg daily 2. Continue concerta 27 mg daily  - monitor fatigue 3. Next appointment: 3/8 at 2 PM for 30 mins, video -- on melatonin 10 mg at night  Past trials of medication:Abilify (weight gain) Trazodone  The patient demonstrates the following risk factors for suicide: Chronic risk factors for suicide include:psychiatric disorder ofdepression. Acute risk factorsfor suicide include:N/A. Protective factorsfor this  patient include: positive social support, coping skills and hope for the future. Considering these factors, the overall suicide risk at this point appears to below. Patientisappropriate for outpatient follow up    Neysa Hotter, MD 06/11/2020, 3:32 PM

## 2020-06-06 ENCOUNTER — Telehealth: Payer: Self-pay

## 2020-06-06 NOTE — Telephone Encounter (Signed)
Calling to schedule patient for a home sleep test

## 2020-06-11 ENCOUNTER — Other Ambulatory Visit: Payer: Self-pay

## 2020-06-11 ENCOUNTER — Encounter: Payer: Self-pay | Admitting: Psychiatry

## 2020-06-11 ENCOUNTER — Telehealth (INDEPENDENT_AMBULATORY_CARE_PROVIDER_SITE_OTHER): Payer: Medicaid Other | Admitting: Psychiatry

## 2020-06-11 DIAGNOSIS — F909 Attention-deficit hyperactivity disorder, unspecified type: Secondary | ICD-10-CM | POA: Diagnosis not present

## 2020-06-11 DIAGNOSIS — F3342 Major depressive disorder, recurrent, in full remission: Secondary | ICD-10-CM | POA: Diagnosis not present

## 2020-06-11 MED ORDER — BUPROPION HCL ER (XL) 150 MG PO TB24
150.0000 mg | ORAL_TABLET | Freq: Every day | ORAL | 0 refills | Status: DC
Start: 2020-06-11 — End: 2020-09-12

## 2020-06-11 MED ORDER — METHYLPHENIDATE HCL ER (OSM) 27 MG PO TBCR: 27.0000 mg | EXTENDED_RELEASE_TABLET | Freq: Every day | ORAL | 0 refills | Status: DC

## 2020-06-11 NOTE — Patient Instructions (Signed)
1.Continuebupropion 150 mg daily 2. Continue concerta 27 mg daily   3. Next appointment: 3/8 at 2 PM

## 2020-07-17 ENCOUNTER — Telehealth: Payer: Self-pay

## 2020-07-17 NOTE — Telephone Encounter (Signed)
received fax that prior auth was approved from 07-17-20 to 07-17-21

## 2020-07-17 NOTE — Telephone Encounter (Signed)
went online and submitted the prior authorization

## 2020-07-17 NOTE — Telephone Encounter (Signed)
received a fax stating that a prior authorization was needed for the methylphenidate

## 2020-07-25 ENCOUNTER — Encounter: Payer: Self-pay | Admitting: Nurse Practitioner

## 2020-07-25 ENCOUNTER — Ambulatory Visit (INDEPENDENT_AMBULATORY_CARE_PROVIDER_SITE_OTHER): Payer: Medicaid Other | Admitting: Nurse Practitioner

## 2020-07-25 ENCOUNTER — Other Ambulatory Visit: Payer: Self-pay

## 2020-07-25 VITALS — BP 110/72 | HR 96 | Temp 97.2°F | Wt 210.0 lb

## 2020-07-25 DIAGNOSIS — Z23 Encounter for immunization: Secondary | ICD-10-CM

## 2020-07-25 DIAGNOSIS — L209 Atopic dermatitis, unspecified: Secondary | ICD-10-CM

## 2020-07-25 MED ORDER — TRIAMCINOLONE ACETONIDE 0.1 % EX OINT: 1.0000 "application " | TOPICAL_OINTMENT | Freq: Two times a day (BID) | CUTANEOUS | 0 refills | Status: DC | PRN

## 2020-07-25 NOTE — Patient Instructions (Signed)
Eczema Eczema refers to a group of skin conditions that cause skin to become rough and inflamed. Each type of eczema has different triggers, symptoms, and treatments. Eczema of any type is usually itchy. Symptoms range from mild to severe. Eczema is not spread from person to person (is not contagious). It can appear on different parts of the body at different times. One person's eczema may look different from another person's eczema. What are the causes? The exact cause of this condition is not known. However, exposure to certain environmental factors, irritants, and allergens can make the condition worse. What are the signs or symptoms? Symptoms of this condition depend on the type of eczema you have. The types include:  Contact dermatitis. There are two kinds: ? Irritant contact dermatitis. This happens when something irritates the skin and causes a rash. ? Allergic contact dermatitis. This happens when your skin comes in contact with something you are allergic to (allergens). This can include poison ivy, chemicals, or medicines that were applied to your skin.  Atopic dermatitis. This is a long-term (chronic) skin disease that keeps coming back (recurring). It is the most common type of eczema. Usual symptoms are a red rash and itchy, dry, scaly skin. It usually starts showing signs in infancy and can last through adulthood.  Dyshidrotic eczema. This is a form of eczema on the hands and feet. It shows up as very itchy, fluid-filled blisters. It can affect people of any age but is more common before age 40.  Hand eczema. This causes very itchy areas of skin on the palms and sides of the hands and fingers. This type of eczema is common in industrial jobs where you may be exposed to different types of irritants.  Lichen simplex chronicus. This type of eczema occurs when a person constantly scratches one area of the body. Repeated scratching of the area leads to thickened skin (lichenification). This  condition can accompany other types of eczema. It is more common in adults but may also be seen in children.  Nummular eczema. This is a common type of eczema that most often affects the lower legs and the backs of the hands. It typically causes an itchy, red, circular, crusty lesion (plaque). Scratching may become a habit and can cause bleeding. Nummular eczema occurs most often in middle-aged or older people.  Seborrheic dermatitis. This is a common skin disease that mainly affects the scalp. It may also affect other oily areas of the body, such as the face, sides of the nose, eyebrows, ears, eyelids, and chest. It is marked by small scaling and redness of the skin (erythema). This can affect people of all ages. In infants, this condition is called cradle cap.  Stasis dermatitis. This is a common skin disease that can cause itching, scaling, and hyperpigmentation, usually on the legs and feet. It occurs most often in people who have a condition that prevents blood from being pumped through the veins in the legs (chronic venous insufficiency). Stasis dermatitis is a chronic condition that needs long-term management.   How is this diagnosed? This condition may be diagnosed based on:  A physical exam of your skin.  Your medical history.  Skin patch tests. These tests involve using patches that contain possible allergens and placing them on your back. Your health care provider will check in a few days to see if an allergic reaction occurred. How is this treated? Treatment for eczema is based on the type of eczema you have. You may be   given hydrocortisone steroid medicine or antihistamines. These can relieve itching quickly and help reduce inflammation. These may be prescribed or purchased over the counter, depending on the strength that is needed. Follow these instructions at home:  Take or apply over-the-counter and prescription medicines only as told by your health care provider.  Use creams or  ointments to moisturize your skin. Do not use lotions.  Learn what triggers or irritates your symptoms so you can avoid these things.  Treat symptom flare-ups quickly.  Do not scratch your skin. This can make your rash worse.  Keep all follow-up visits. This is important. Where to find more information  American Academy of Dermatology: aad.org  National Eczema Association: nationaleczema.org  The Society for Pediatric Dermatology: pedsderm.net Contact a health care provider if:  You have severe itching, even with treatment.  You scratch your skin regularly until it bleeds.  Your rash looks different than usual.  Your skin is painful, swollen, or more red than usual.  You have a fever. Summary  Eczema refers to a group of skin conditions that cause skin to become rough and inflamed. Each type has different triggers.  Eczema of any type causes itching that may range from mild to severe.  Treatment varies based on the type of eczema you have. Hydrocortisone steroid medicine or antihistamines can help with itching and inflammation.  Protecting your skin is the best way to prevent eczema. Use creams or ointments to moisturize your skin. Avoid triggers and irritants. Treat flare-ups quickly. This information is not intended to replace advice given to you by your health care provider. Make sure you discuss any questions you have with your health care provider. Document Revised: 03/25/2020 Document Reviewed: 03/25/2020 Elsevier Patient Education  2021 Elsevier Inc.  

## 2020-07-25 NOTE — Progress Notes (Signed)
Subjective:    Patient ID: Felicia Martinez, female    DOB: 12/24/2000, 20 y.o.   MRN: [id number]  HPI: Felicia Martinez is a 20 y.o. female presenting for rash on both nipples.  Chief Complaint  Patient presents with  . Rash    On both breast   RASH Reports has been back for 5-6 months but has become a major problem for the last 2 months.  At first was right breast, now is on left.  Was sent to a Dermatologist in past and given a cream that worked very well. Duration:  months; worse for past couple Location: bilateral breasts  Itching: yes Burning: no  Discharge: yes; clear at times on the bra Redness: yes Oozing: no Scaling: yes Blisters: no Painful: yes Fevers: no Change in detergents/soaps/personal care products: yes; thinks with [place] Recent illness: no Recent travel:no History of same: yes Context: worse Alleviating factors: warm shower with cocoa butter Treatments attempted: cocoa butter Shortness of breath: no  Throat/tongue swelling: no Myalgias/arthralgias: no   Allergies  Allergen Reactions  . Other     Seasonal Allergies-pollen    Outpatient Encounter Medications as of 07/25/2020  Medication Sig  . buPROPion (WELLBUTRIN XL) 150 MG 24 hr tablet Take 1 tablet (150 mg total) by mouth daily.  . fluticasone (FLONASE) 50 MCG/ACT nasal spray Place 2 sprays into both nostrils daily.  . methylphenidate (CONCERTA) 27 MG PO CR tablet Take 1 tablet (27 mg total) by mouth every morning.  . [DISCONTINUED] methylphenidate (CONCERTA) 27 MG PO CR tablet Take 1 tablet (27 mg total) by mouth daily before breakfast.  . [DISCONTINUED] methylphenidate (CONCERTA) 27 MG PO CR tablet Take 1 tablet (27 mg total) by mouth daily before breakfast.  . [DISCONTINUED] methylphenidate (CONCERTA) 27 MG PO CR tablet Take 1 tablet (27 mg total) by mouth daily before breakfast.   Facility-Administered Encounter Medications as of 07/25/2020  Medication  . medroxyPROGESTERone  (DEPO-PROVERA) injection 150 mg    Patient Active Problem List   Diagnosis Date Noted  . Elevated cholesterol with elevated triglycerides 07/20/2019  . Migraine without aura and without status migrainosus, not intractable 11/12/2014  . Tension headache 11/12/2014  . Bipolar affective disorder, currently depressed, mild (HCC) 11/12/2014  . Anxiety state 11/12/2014  . Undifferentiated schizophrenia (HCC) 11/12/2014  . Childhood obesity 10/16/2014  . Migraine headache 09/18/2014  . 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 Allergy   . Asthma     Past Medical History:  Diagnosis Date  . Allergy   . Asthma   . Morbid obesity (HCC)   . 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 Interim medical history since our last visit reviewed.  Review of Systems Per HPI unless specifically indicated above     Objective:    BP 110/72 (BP Location: Left Arm, Patient Position: Sitting, Cuff Size: Normal)   Pulse 96   Temp (!) 97.2 F (36.2 C) (Temporal)   Wt 210 lb (95.3 kg)   SpO2 98%   BMI 39.68 kg/m   Wt Readings from Last 3 Encounters:  07/25/20 210 lb (95.3 kg) (98 %, Z= 2.08)*  04/11/20 218 lb (98.9 kg) (99 %, Z= 2.18)*  01/05/20 215 lb (97.5 kg) (98 %, Z= 2.15)*   * Growth percentiles are based on CDC (Girls, 2-20 Years) data.    Physical Exam Vitals and nursing note reviewed.  Constitutional:  General: She is not in acute distress.    Appearance: Normal appearance. She is not toxic-appearing.  HENT:     Head: Normocephalic and atraumatic.  Chest:       Comments: Slightly pink flaky plaques noted to bilateral nipples in 12 o'clock position; as in picture above.  No oozing, drainage, or tenderness with palpation.  Skin:    General: Skin is warm and dry.     Capillary Refill: Capillary refill takes less than 2 seconds.     Coloration: Skin is not jaundiced or pale.     Findings: Lesion and rash  present. No erythema.  Neurological:     Mental Status: She is alert and oriented to person, place, and time.     Motor: No weakness.     Gait: Gait normal.  Psychiatric:        Mood and Affect: Mood normal.        Behavior: Behavior normal.        Thought Content: Thought content normal.        Judgment: Judgment normal.       Assessment & Plan:  1. Atopic dermatitis, unspecified type Chronic, uncontrolled.  Will resume as need topical steroid as previously prescribed by Dermatology.  Triamcinolone 0.1% ointment twice daily as needed for 7 days.  If symptoms do not resolve or if they persist, return to clinic and consider referral to Dermatology.  Discussed keeping skin hydrated with vaseline or another similar moisturizer  Discussed importance of not continuously using steroid ointment for more than a week or two.  Follow up plan: Return if symptoms worsen or fail to improve.

## 2020-08-06 ENCOUNTER — Ambulatory Visit (INDEPENDENT_AMBULATORY_CARE_PROVIDER_SITE_OTHER): Payer: Medicaid Other | Admitting: Family Medicine

## 2020-08-06 ENCOUNTER — Other Ambulatory Visit: Payer: Self-pay

## 2020-08-06 VITALS — BP 124/76 | HR 69 | Temp 97.7°F | Resp 15 | Ht 61.0 in | Wt 209.0 lb

## 2020-08-06 DIAGNOSIS — L83 Acanthosis nigricans: Secondary | ICD-10-CM | POA: Diagnosis not present

## 2020-08-06 NOTE — Progress Notes (Signed)
Established Patient Office Visit  Subjective:  Patient ID: Felicia Martinez, female    DOB: 2000-11-17  Age: 20 y.o. MRN: [id number]  CC:  Chief Complaint  Patient presents with  . Rash    Pt notes rash /dry skin on her Rt foot and ankle first noticed about 1 week ago no pain or itching, discolored and some dry skin and raised edges     HPI  Patient is a very pleasant 20 year old Hispanic female who presents today with a rash on the dorsum of her right lateral ankle.  She was recently treated with triamcinolone cream for eczema on her breast.  This rash appears to be an eczema.  It has a velvety appearance.  It has lichenification to the skin.  It is hyperpigmented and dry and scaly.  There are no sharp borders or serpiginous borders or sign of fungus.  She states that is itching.  The patch is located just anterior to the right lateral malleolus and extends over to the 5th metatarsal.  It is slightly brown and hyperpigmented in color.  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  Her family history is significant for type 2 diabetes in her mother.  She is concerned because she has experienced weight gain on Depo that she may be developing diabetes. Past Medical History:  Diagnosis Date  . Allergy   . Asthma   . Morbid obesity (HCC)   . 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  Family History  Problem Relation Age of Onset  . Hypertension Mother   . Anxiety disorder Mother   . Depression Mother   . Lung cancer Maternal Grandmother   . Schizophrenia Maternal Aunt     Social History   Socioeconomic History  . Marital status: Single    Spouse name: Not on file  . Number of children: Not on file  . Years of education: Not on file  . Highest education level: Not on file  Occupational History  . Not on file  Tobacco Use  . Smoking status: Never Smoker  . Smokeless tobacco: Never Used  Vaping Use  . Vaping Use: Never used  Substance and  Sexual Activity  . Alcohol use: Yes    Comment: occasional  . Drug use: No  . Sexual activity: Yes    Birth control/protection: Condom    Comment: menarch age 69.  Lives with mom.  Parents separated.  Other Topics Concern  . Not on file  Social History Narrative   In 9th grade at [employer]. She is performing good in school.  No regular exercise.  More than 3 hours of screen time per day.  Enjoys watching netflix, writing poetry and talking to friends.    Social Determinants of Health   Financial Resource Strain: Not on file  Food Insecurity: Food Insecurity Present  . Worried About Programme researcher, broadcasting/film/video in the Last Year: Sometimes true  . Ran Out of Food in the Last Year: Sometimes true  Transportation Needs: No Transportation Needs  . Lack of Transportation (Medical): No  . Lack of Transportation (Non-Medical): No  Physical Activity: Inactive  . Days of Exercise per Week: 0 days  . Minutes of Exercise per Session: 0 min  Stress: Stress Concern Present  . Feeling of Stress : Very much  Social Connections: Socially Isolated  . Frequency of Communication with Friends and Family: Twice a week  . Frequency of  Social Gatherings with Friends and Family: Once a week  . Attends Religious Services: Never  . Active Member of Clubs or Organizations: No  . Attends Banker Meetings: Never  . Marital Status: Never married  Intimate Partner Violence: Not At Risk  . Fear of Current or Ex-Partner: No  . Emotionally Abused: No  . Physically Abused: No  . Sexually Abused: No    Outpatient Medications Prior to Visit  Medication Sig Dispense Refill  . buPROPion (WELLBUTRIN XL) 150 MG 24 hr tablet Take 1 tablet (150 mg total) by mouth daily. 90 tablet 0  . fluticasone (FLONASE) 50 MCG/ACT nasal spray Place 2 sprays into both nostrils daily. 16 g 6  . methylphenidate (CONCERTA) 27 MG PO CR tablet Take 1 tablet (27 mg total) by mouth every morning. 30 tablet 0  .  triamcinolone ointment (KENALOG) 0.1 % Apply 1 application topically 2 (two) times daily as needed (eczema flare). Do not use for more than 1 or 2 weeks.  If eczema does not improve after 2 weeks of continuous use, return to clinic. 30 g 0   Facility-Administered Medications Prior to Visit  Medication Dose Route Frequency Provider Last Rate Last Admin  . medroxyPROGESTERone (DEPO-PROVERA) injection 150 mg  150 mg Intramuscular Q90 days Donita Brooks, MD   150 mg at 04/27/19 0815    Allergies  Allergen Reactions  . Other     Seasonal Allergies-pollen    ROS Review of Systems  All other systems reviewed and are negative.     Objective:    Physical Exam Constitutional:      Appearance: She is well-developed.  HENT:     Head: Normocephalic.  Eyes:     Pupils: Pupils are equal, round, and reactive to light.  Cardiovascular:     Rate and Rhythm: Normal rate and regular rhythm.  Pulmonary:     Effort: Pulmonary effort is normal.     Breath sounds: Normal breath sounds.  Abdominal:     General: Bowel sounds are normal.     Palpations: Abdomen is soft.     Tenderness: There is no abdominal tenderness.  Skin:    Findings: Rash present. No erythema. Rash is macular. Rash is not nodular, papular, purpuric or pustular.             Assessment & Plan:   Acanthosis nigricans - Plan: Hemoglobin A1c, BASIC METABOLIC PANEL WITH GFR  I believe the rash on the dorsum of her foot is most likely atopic dermatitis.  She has some triamcinolone cream that she used for her breast.  I recommended that she use that cream on her foot twice a day for 1 to 2 weeks and I suspect the rash will improve.  After that I recommended moisturizers to help control it so that she is not dependent on using steroids too often.  I am concerned about the rash on her neck.  That coupled with her family history and elevated BMI puts her at higher risk for type 2 diabetes so I will check an A1c and a BMP  today.  Donita Brooks, MD

## 2020-08-07 LAB — BASIC METABOLIC PANEL WITH GFR
BUN: 13 mg/dL (ref 7–20)
CO2: 27 mmol/L (ref 20–32)
Calcium: 9.4 mg/dL (ref 8.9–10.4)
Chloride: 106 mmol/L (ref 98–110)
Creat: 0.59 mg/dL (ref 0.50–1.00)
GFR, Est African American: 154 mL/min/{1.73_m2} (ref >=60)
GFR, Est Non African American: 133 mL/min/{1.73_m2} (ref >=60)
Glucose, Bld: 99 mg/dL (ref 65–99)
Potassium: 4.2 mmol/L (ref 3.8–5.1)
Sodium: 139 mmol/L (ref 135–146)

## 2020-08-07 LAB — HEMOGLOBIN A1C
Hgb A1c MFr Bld: 5.4 % of total Hgb (ref <5.7)
Mean Plasma Glucose: 108 mg/dL
eAG (mmol/L): 6 mmol/L

## 2020-08-28 NOTE — Progress Notes (Signed)
Virtual Visit via Video Note  I connected with Felicia Martinez on 09/10/20 at  3:30 PM EDT by a video enabled telemedicine application and verified that I am speaking with the correct person using two identifiers.  Location: Patient: home Provider: office Persons participated in the visit- patient, provider   I discussed the limitations of evaluation and management by telemedicine and the availability of in person appointments. The patient expressed understanding and agreed to proceed.   I discussed the assessment and treatment plan with the patient. The patient was provided an opportunity to ask questions and all were answered. The patient agreed with the plan and demonstrated an understanding of the instructions.   The patient was advised to call back or seek an in-person evaluation if the symptoms worsen or if the condition fails to improve as anticipated.  I provided 15 minutes of non-face-to-face time during this encounter.   Neysa Hottereina Inaara Tye, MD    [facility] OP Progress Note  09/10/2020 4:08 PM Felicia Martinez  MRN:  [id number]  Chief Complaint:  Chief Complaint    Depression; ADHD; Follow-up     HPI:  This is a follow-up appointment for depression and ADHD.  She states that she is "out of it.  Although she finds it difficult to express, she feels de motivated.  She has had difficulty in studying.  Although she tries to focus, it lasts only for short duration.  She has missed morning classes due to significant fatigue.  She tries to hang out with her friends.  She broke up with her boyfriend.  Although it is sad, she does not think it has affected her that much.  She denies feeling depressed.  She denies insomnia.  She denies anxiety.  She has slightly low appetite, although she denies weight change.  She is wanting to try higher dose of Concerta after being provided psychoeducation of its potential risk of the medication.  She has not been able to do home sleep study; she  agrees to contact the clinic to get this done.    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 [employer] to become a Runner, broadcasting/film/videoteacher. Took honors in high school [place] "she describes her childhood as "nice childhood though poor." Her parents are from [place], and separated. She has good relationship with both of her parents  Wt Readings from Last 3 Encounters:  08/06/20 209 lb (94.8 kg) (98 %, Z= 2.07)*  07/25/20 210 lb (95.3 kg) (98 %, Z= 2.08)*  04/11/20 218 lb (98.9 kg) (99 %, Z= 2.18)*   * Growth percentiles are based on CDC (Girls, 2-20 Years) data.    Visit Diagnosis:    ICD-10-CM   1. MDD (major depressive disorder), recurrent, in full remission (HCC)  F33.42   2. Attention deficit hyperactivity disorder (ADHD), unspecified ADHD type  F90.9     Past Psychiatric History: Please see initial evaluation for full details. I have reviewed the history. No updates at this time.     Past Medical History:  Past Medical History:  Diagnosis Date  . Allergy   . Asthma   . Morbid obesity (HCC)   . 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  Family Psychiatric History: Please see initial evaluation for  full details. I have reviewed the history. No updates at this time.     Family History:  Family History  Problem Relation Age of Onset  . Hypertension Mother   . Anxiety disorder Mother   . Depression Mother   . Lung cancer Maternal Grandmother   . Schizophrenia Maternal Aunt     Social History:  Social History   Socioeconomic History  . Marital status: Single    Spouse name: Not on file  . Number of children: Not on file  . Years of education: Not on file  . Highest  education level: Not on file  Occupational History  . Not on file  Tobacco Use  . Smoking status: Never Smoker  . Smokeless tobacco: Never Used  Vaping Use  . Vaping Use: Never used  Substance and Sexual Activity  . Alcohol use: Yes    Comment: occasional  . Drug use: No  . Sexual activity: Yes    Birth control/protection: Condom    Comment: menarch age 2.  Lives with mom.  Parents separated.  Other Topics Concern  . Not on file  Social History Narrative   In 9th grade at [employer]. She is performing good in school.  No regular exercise.  More than 3 hours of screen time per day.  Enjoys watching netflix, writing poetry and talking to friends.    Social Determinants of Health   Financial Resource Strain: Not on file  Food Insecurity: Food Insecurity Present  . Worried About Programme researcher, broadcasting/film/video in the Last Year: Sometimes true  . Ran Out of Food in the Last Year: Sometimes true  Transportation Needs: No Transportation Needs  . Lack of Transportation (Medical): No  . Lack of Transportation (Non-Medical): No  Physical Activity: Inactive  . Days of Exercise per Week: 0 days  . Minutes of Exercise per Session: 0 min  Stress: Stress Concern Present  . Feeling of Stress : Very much  Social Connections: Socially Isolated  . Frequency of Communication with Friends and Family: Twice a week  . Frequency of Social Gatherings with Friends and Family: Once a week  . Attends Religious Services: Never  . Active Member of Clubs or Organizations: No  . Attends Banker Meetings: Never  . Marital Status: Never married    Allergies:  Allergies  Allergen Reactions  . Other     Seasonal Allergies-pollen    Metabolic Disorder Labs: Lab Results  Component Value Date   HGBA1C 5.4 08/06/2020   MPG 108 08/06/2020   MPG 114 10/18/2019   Lab Results  Component Value Date   PROLACTIN 8.8 01/17/2015   Lab Results  Component Value Date   CHOL 169 10/18/2019    TRIG 153 (H) 10/18/2019   HDL 44 (L) 10/18/2019   CHOLHDL 3.8 10/18/2019   VLDL 35 (H) 08/02/2015   LDLCALC 100 10/18/2019   LDLCALC 110 (H) 08/02/2015   Lab Results  Component Value Date   TSH 1.30 03/07/2020   TSH 1.067 01/17/2015    Therapeutic Level Labs: No results found for: LITHIUM No results found for: VALPROATE No components found for:  CBMZ  Current Medications: Current Outpatient Medications  Medication Sig Dispense Refill  . methylphenidate 36 MG PO CR tablet Take 1 tablet (36 mg total) by mouth daily. 30 tablet 0  . buPROPion (WELLBUTRIN XL) 150 MG 24 hr tablet Take 1 tablet (150 mg total) by mouth daily. 90 tablet 0  . fluticasone (FLONASE) 50  MCG/ACT nasal spray Place 2 sprays into both nostrils daily. 16 g 6  . methylphenidate (CONCERTA) 27 MG PO CR tablet Take 1 tablet (27 mg total) by mouth every morning. 30 tablet 0  . triamcinolone ointment (KENALOG) 0.1 % Apply 1 application topically 2 (two) times daily as needed (eczema flare). Do not use for more than 1 or 2 weeks.  If eczema does not improve after 2 weeks of continuous use, return to clinic. 30 g 0   Current Facility-Administered Medications  Medication Dose Route Frequency Provider Last Rate Last Admin  . medroxyPROGESTERone (DEPO-PROVERA) injection 150 mg  150 mg Intramuscular Q90 days Donita Brooks, MD   150 mg at 04/27/19 0815     Musculoskeletal: Strength & Muscle Tone: N/A Gait & Station: N/A Patient leans: N/A  Psychiatric Specialty Exam: Review of Systems  Psychiatric/Behavioral: Positive for decreased concentration. Negative for agitation, behavioral problems, confusion, dysphoric mood, hallucinations, self-injury, sleep disturbance and suicidal ideas. The patient is not nervous/anxious and is not hyperactive.   All other systems reviewed and are negative.   There were no vitals taken for this visit.There is no height or weight on file to calculate BMI.  General Appearance: Fairly  Groomed  Eye Contact:  Good  Speech:  Clear and Coherent  Volume:  Normal  Mood:  "out of it"  Affect:  Appropriate, Congruent and fatigue  Thought Process:  Coherent  Orientation:  Full (Time, Place, and Person)  Thought Content: Logical   Suicidal Thoughts:  No  Homicidal Thoughts:  No  Memory:  Immediate;   Good  Judgement:  Good  Insight:  Good  Psychomotor Activity:  Normal  Concentration:  Concentration: Good and Attention Span: Good  Recall:  Good  Fund of Knowledge: Good  Language: Good  Akathisia:  No  Handed:  Right  AIMS (if indicated): not done  Assets:  Communication Skills Desire for Improvement  ADL's:  Intact  Cognition: WNL  Sleep:  Good   Screenings: GAD-7   Flowsheet Row Office Visit from 12/21/2019 in [facility]  Total GAD-7 Score 5    PHQ2-9   Flowsheet Row Video Visit from 09/10/2020 in [facility] Office Visit from 07/25/2020 in [facility] Office Visit from 12[facility]21/2019 in [facility] Office Visit from 08/29/2018 in [facility] Office Visit from 08/27/2017 in [facility]  PHQ-2 Total Score 1 0 2 0 2  PHQ-9 Total Score -- 1 14 -- 5    Flowsheet Row Video Visit from 09/10/2020 in [facility]  C-SSRS RISK CATEGORY No Risk       Assessment and Plan:  JAMARRIA REAL is a 20 y.o. year old female with a history of depression, who presents for follow up appointment for below.   1. MDD (major depressive disorder), recurrent, in full remission (HCC) She denies any mood symptoms except prominent fatigue.  We will continue current dose of bupropion to target depression.   2. Attention deficit hyperactivity disorder (ADHD), unspecified ADHD type She reports worsening in inattention and fatigue since her last visit.  Will uptitrate Concerta to optimize treatment for ADHD.  Discussed risks includes but not limited to worsening in  anxiety, palpitation, decreased appetite, insomnia, dependence and tolerance.   3. Insomnia, unspecified type Coached sleep hygiene. She reports benefit from higher dose of melatonin. She is seen by neurologist for evaluation of sleep apnea.Pending home sleep study.  Plan 1.Continuebupropion  150 mg daily 2.Increase Concerta36mg  daily - monitor fatigue 3. Next appointment: 4/12 at 1:40 for 20 mins, video  Past trials of medication:Abilify (weight gain) Trazodone, melatonin (felt sad)  The patient demonstrates the following risk factors for suicide: Chronic risk factors for suicide include:psychiatric disorder ofdepression. Acute risk factorsfor suicide include:N/A. Protective factorsfor this patient include: positive social support, coping skills and hope for the future. Considering these factors, the overall suicide risk at this point appears to below. Patientisappropriate for outpatient follow up    Neysa Hotter, MD 09/10/2020, 4:08 PM

## 2020-09-03 ENCOUNTER — Telehealth: Payer: Medicaid Other | Admitting: Psychiatry

## 2020-09-10 ENCOUNTER — Telehealth (INDEPENDENT_AMBULATORY_CARE_PROVIDER_SITE_OTHER): Payer: Medicaid Other | Admitting: Psychiatry

## 2020-09-10 ENCOUNTER — Other Ambulatory Visit: Payer: Self-pay

## 2020-09-10 ENCOUNTER — Encounter: Payer: Self-pay | Admitting: Psychiatry

## 2020-09-10 DIAGNOSIS — F3342 Major depressive disorder, recurrent, in full remission: Secondary | ICD-10-CM

## 2020-09-10 DIAGNOSIS — F909 Attention-deficit hyperactivity disorder, unspecified type: Secondary | ICD-10-CM

## 2020-09-10 MED ORDER — METHYLPHENIDATE HCL ER (OSM) 36 MG PO TBCR: 36.0000 mg | EXTENDED_RELEASE_TABLET | Freq: Every day | ORAL | 0 refills | Status: DC

## 2020-09-10 NOTE — Patient Instructions (Signed)
1.Continuebupropion 150 mg daily 2.Increase Concerta36mg  daily  3. Next appointment: 4/12 at 1:40

## 2020-09-12 ENCOUNTER — Other Ambulatory Visit: Payer: Self-pay | Admitting: Psychiatry

## 2020-09-12 ENCOUNTER — Telehealth: Payer: Self-pay

## 2020-09-12 MED ORDER — BUPROPION HCL ER (XL) 150 MG PO TB24: 150.0000 mg | ORAL_TABLET | Freq: Every day | ORAL | 0 refills | Status: DC

## 2020-09-12 NOTE — Telephone Encounter (Signed)
received a fax requesting a refill on the bupropion hcl xl 150mg 

## 2020-09-12 NOTE — Telephone Encounter (Signed)
Ordered

## 2020-10-02 NOTE — Progress Notes (Signed)
Virtual Visit via Video Note  I connected with Felicia Martinez on 10/08/20 at  1:40 PM EDT by a video enabled telemedicine application and verified that I am speaking with the correct person using two identifiers.  Location: Patient: home Provider: office Persons participated in the visit- patient, provider   I discussed the limitations of evaluation and management by telemedicine and the availability of in person appointments. The patient expressed understanding and agreed to proceed.   I discussed the assessment and treatment plan with the patient. The patient was provided an opportunity to ask questions and all were answered. The patient agreed with the plan and demonstrated an understanding of the instructions.   The patient was advised to call back or seek an in-person evaluation if the symptoms worsen or if the condition fails to improve as anticipated.  I provided 15 minutes of non-face-to-face time during this encounter.   Neysa Hotter, MD     [facility] MD/PA/NP OP Progress Note  10/08/2020 2:05 PM Felicia Martinez  MRN:  [id number]  Chief Complaint:  Chief Complaint    Follow-up; ADHD; Depression     HPI:  This is a follow-up appointment for ADHD and depression.  She states that she feels more motivated and is doing very well/well focused since up titration of Concerta.  Although she still has moments of daydreaming, it is not as bad compared to before.  She has been doing well at school.  She has started to work at [employer], which has been better compared to her previous job.  Only the concern she has is decreased appetite.  She makes sure to drink shake, although she does not have any appetite.  However, she is not too much concerned about this, stating that she used to overeat before.  She is willing to work on taking regular meals every day at least twice a day.  She denies feeling depressed.  She enjoys hanging out with her friends.  She denies SI.  She feels  anxious at times.  She drinks a bottle of wine with her friends once a week or less.  She denies drug use.    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 Took honors in high school She grew up in [place], "she describes her childhood as "nice childhood though poor." Her parents are from [place] and separated. She has good relationship with both of her parents  Visit Diagnosis:    ICD-10-CM   1. MDD (major depressive disorder), recurrent, in full remission (HCC)  F33.42   2. Attention deficit hyperactivity disorder (ADHD), unspecified ADHD type  F90.9     Past Psychiatric History: Please see initial evaluation for full details. I have reviewed the history. No updates at this time.     Past Medical History:  Past Medical History:  Diagnosis Date  . Allergy   . Asthma   . Morbid obesity (HCC)   . 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  Family Psychiatric History: Please see initial evaluation for full details. I have reviewed the history. No updates at this time.     Family History:  Family History  Problem Relation Age of Onset  . Hypertension Mother   . Anxiety  disorder Mother   . Depression Mother   . Lung cancer Maternal Grandmother   . Schizophrenia Maternal Aunt     Social History:  Social History   Socioeconomic History  . Marital status: Single    Spouse name: Not on file  . Number of children: Not on file  . Years of education: Not on file  . Highest education level: Not on file  Occupational History  . Not on file  Tobacco Use  . Smoking status: Never Smoker  . Smokeless tobacco: Never Used  Vaping Use  . Vaping Use:  Never used  Substance and Sexual Activity  . Alcohol use: Yes    Comment: occasional  . Drug use: No  . Sexual activity: Yes    Birth control/protection: Condom    Comment: menarch age 4.  Lives with mom.  Parents separated.  Other Topics Concern  . Not on file  Social History Narrative   In 9th grade at [employer]. She is performing good in school.  No regular exercise.  More than 3 hours of screen time per day.  Enjoys watching netflix, writing poetry and talking to friends.    Social Determinants of Health   Financial Resource Strain: Not on file  Food Insecurity: Food Insecurity Present  . Worried About Programme researcher, broadcasting/film/video in the Last Year: Sometimes true  . Ran Out of Food in the Last Year: Sometimes true  Transportation Needs: No Transportation Needs  . Lack of Transportation (Medical): No  . Lack of Transportation (Non-Medical): No  Physical Activity: Inactive  . Days of Exercise per Week: 0 days  . Minutes of Exercise per Session: 0 min  Stress: Stress Concern Present  . Feeling of Stress : Very much  Social Connections: Socially Isolated  . Frequency of Communication with Friends and Family: Twice a week  . Frequency of Social Gatherings with Friends and Family: Once a week  . Attends Religious Services: Never  . Active Member of Clubs or Organizations: No  . Attends Banker Meetings: Never  . Marital Status: Never married    Allergies:  Allergies  Allergen Reactions  . Other     Seasonal Allergies-pollen    Metabolic Disorder Labs: Lab Results  Component Value Date   HGBA1C 5.4 08/06/2020   MPG 108 08/06/2020   MPG 114 10/18/2019   Lab Results  Component Value Date   PROLACTIN 8.8 01/17/2015   Lab Results  Component Value Date   CHOL 169 10/18/2019   TRIG 153 (H) 10/18/2019   HDL 44 (L) 10/18/2019   CHOLHDL 3.8 10/18/2019   VLDL 35 (H) 08/02/2015   LDLCALC 100 10/18/2019   LDLCALC 110 (H) 08/02/2015   Lab Results   Component Value Date   TSH 1.30 03/07/2020   TSH 1.067 01/17/2015    Therapeutic Level Labs: No results found for: LITHIUM No results found for: VALPROATE No components found for:  CBMZ  Current Medications: Current Outpatient Medications  Medication Sig Dispense Refill  . methylphenidate (CONCERTA) 36 MG PO CR tablet Take 1 tablet (36 mg total) by mouth daily before breakfast. 30 tablet 0  . [START ON 11/07/2020] methylphenidate (CONCERTA) 36 MG PO CR tablet Take 1 tablet (36 mg total) by mouth daily before breakfast. 30 tablet 0  . buPROPion (WELLBUTRIN XL) 150 MG 24 hr tablet Take 1 tablet (150 mg total) by mouth daily. 90 tablet 0  . fluticasone (FLONASE) 50 MCG/ACT nasal spray Place 2 sprays  into both nostrils daily. 16 g 6  . triamcinolone ointment (KENALOG) 0.1 % Apply 1 application topically 2 (two) times daily as needed (eczema flare). Do not use for more than 1 or 2 weeks.  If eczema does not improve after 2 weeks of continuous use, return to clinic. 30 g 0   Current Facility-Administered Medications  Medication Dose Route Frequency Provider Last Rate Last Admin  . medroxyPROGESTERone (DEPO-PROVERA) injection 150 mg  150 mg Intramuscular Q90 days Donita Brooks, MD   150 mg at 04/27/19 0815     Musculoskeletal: Strength & Muscle Tone: N/A Gait & Station: N/A Patient leans: N/A  Psychiatric Specialty Exam: Review of Systems  Psychiatric/Behavioral: Negative for agitation, behavioral problems, confusion, decreased concentration, dysphoric mood, hallucinations, self-injury, sleep disturbance and suicidal ideas. The patient is nervous/anxious. The patient is not hyperactive.   All other systems reviewed and are negative.   There were no vitals taken for this visit.There is no height or weight on file to calculate BMI.  General Appearance: Fairly Groomed  Eye Contact:  Good  Speech:  Clear and Coherent  Volume:  Normal  Mood:  good  Affect:  Appropriate, Congruent  and Full Range  Thought Process:  Coherent  Orientation:  Full (Time, Place, and Person)  Thought Content: Logical   Suicidal Thoughts:  No  Homicidal Thoughts:  No  Memory:  Immediate;   Good  Judgement:  Good  Insight:  Good  Psychomotor Activity:  Normal  Concentration:  Concentration: Good and Attention Span: Good  Recall:  Good  Fund of Knowledge: Good  Language: Good  Akathisia:  No  Handed:  Right  AIMS (if indicated): not done  Assets:  Communication Skills Desire for Improvement  ADL's:  Intact  Cognition: WNL  Sleep:  Good   Screenings: GAD-7   Flowsheet Row Office Visit from 12/21/2019 in [facility]  Total GAD-7 Score 5    PHQ2-9   Flowsheet Row Video Visit from 10/08/2020 in [facility] Video Visit from 09/10/2020 in [facility] Office Visit from 07/25/2020 in [facility] Office Visit from 12/21/2019 in [facility] Office Visit from 08/29/2018 in [facility]  PHQ-2 Total Score 0 1 0 2 0  PHQ-9 Total Score -- -- 1 14 --    Flowsheet Row Video Visit from 10/08/2020 in [facility] Video Visit from 09/10/2020 in [facility]  C-SSRS RISK CATEGORY No Risk No Risk       Assessment and Plan:  Felicia Martinez is a 20 y.o. year old female with a history of depression, who presents for follow up appointment for below.   1. MDD (major depressive disorder), recurrent, in full remission (HCC) She denies any mood symptoms since the last visit.  We will continue current dose of bupropion to target depression.   2. Attention deficit hyperactivity disorder (ADHD), unspecified ADHD type She has had significant improvement in ADHD symptoms since up titration of Concerta.  Although it was recommended to switch formula due to her decreased appetite, she has strong preference to stay on the current dose at this time.   She is willing to work on her diet over eating at least 2 meals per day.  We will continue to monitor, and make adjustments as needed on the next visit.   3. Insomnia, unspecified type Coached sleep hygiene. She reports benefit from higher dose of melatonin.She is seen  by neurologist for evaluation of sleep apnea.Pendinghomesleep study.  Plan 1.Continuebupropion 150 mg daily 2.Continue Concerta36mg  daily   3. Next appointment:6/14 at 3:30 for 30 mins, video  This clinician has discussed the side effect associated with medication prescribed during this encounter. Please refer to notes in the previous encounters for more details.   Past trials of medication:Abilify (weight gain) Trazodone, melatonin (felt sad)  The patient demonstrates the following risk factors for suicide: Chronic risk factors for suicide include:psychiatric disorder ofdepression. Acute risk factorsfor suicide include:N/A. Protective factorsfor this patient include: positive social support, coping skills and hope for the future. Considering these factors, the overall suicide risk at this point appears to below. Patientisappropriate for outpatient follow up   Neysa Hottereina Rhoda Waldvogel, MD 10/08/2020, 2:05 PM

## 2020-10-08 ENCOUNTER — Other Ambulatory Visit: Payer: Self-pay

## 2020-10-08 ENCOUNTER — Telehealth (INDEPENDENT_AMBULATORY_CARE_PROVIDER_SITE_OTHER): Payer: Medicaid Other | Admitting: Psychiatry

## 2020-10-08 ENCOUNTER — Encounter: Payer: Self-pay | Admitting: Psychiatry

## 2020-10-08 DIAGNOSIS — F909 Attention-deficit hyperactivity disorder, unspecified type: Secondary | ICD-10-CM | POA: Diagnosis not present

## 2020-10-08 DIAGNOSIS — F3342 Major depressive disorder, recurrent, in full remission: Secondary | ICD-10-CM

## 2020-10-08 MED ORDER — METHYLPHENIDATE HCL ER (OSM) 36 MG PO TBCR: 36.0000 mg | EXTENDED_RELEASE_TABLET | Freq: Every day | ORAL | 0 refills | Status: DC

## 2020-10-08 NOTE — Patient Instructions (Signed)
1.Continuebupropion 150 mg daily 2.Continue Concerta36mg  daily   3. Next appointment:6/14 at 3:30

## 2020-10-31 ENCOUNTER — Encounter: Payer: Self-pay | Admitting: Nurse Practitioner

## 2020-10-31 ENCOUNTER — Encounter: Payer: Self-pay | Admitting: Family Medicine

## 2020-10-31 ENCOUNTER — Ambulatory Visit (INDEPENDENT_AMBULATORY_CARE_PROVIDER_SITE_OTHER): Payer: Medicaid Other | Admitting: Nurse Practitioner

## 2020-10-31 ENCOUNTER — Other Ambulatory Visit: Payer: Self-pay

## 2020-10-31 VITALS — BP 110/82 | HR 97 | Temp 97.9°F | Ht 61.0 in | Wt 190.0 lb

## 2020-10-31 DIAGNOSIS — H5789 Other specified disorders of eye and adnexa: Secondary | ICD-10-CM

## 2020-10-31 MED ORDER — ERYTHROMYCIN 5 MG/GM OP OINT: 1.0000 "application " | TOPICAL_OINTMENT | Freq: Four times a day (QID) | OPHTHALMIC | 0 refills | Status: DC

## 2020-10-31 NOTE — Progress Notes (Signed)
Subjective:    Patient ID: Felicia Martinez, female    DOB: Mar 22, 2001, 20 y.o.   MRN: [id number]  HPI: Felicia Martinez is a 20 y.o. female presenting for eye drainage.  Chief Complaint  Patient presents with  . Eye Drainage    For the past 2 days itchy, watery, drainage green in color and swelling in both eyes. Using cold towel on eyes   EYE DRAINAGE Patient reports eye drainage that started Sunday and worsened on Tuesday.  She woke up Tuesday morning and her eyelids were both matted shut and there was green drainage.  She also folic her eyes are swollen Tuesday.  She works in [employer] as a Conservation officer, nature and has not been to work since her symptoms started Tuesday.   Duration:  days; worse 2 days ago Tuesday Involved eye:  bilateral Onset: sudden Severity: sore - mild Quality: soreness Foreign body sensation:no Visual impairment: no Eye redness: yes Discharge: yes Crusting or matting of eyelids: yes Swelling: yes Photophobia: no Itching: yes Tearing: yes Headache: no Floaters: yes URI symptoms: no Contact lens use: no Close contacts with similar problems: no Eye trauma: no Aggravating factors: Nothing Alleviating factors: Cold compress Status: Better Treatments attempted: Cold compress  Allergies  Allergen Reactions  . Other     Seasonal Allergies-pollen    Outpatient Encounter Medications as of 10/31/2020  Medication Sig  . buPROPion (WELLBUTRIN XL) 150 MG 24 hr tablet Take 1 tablet (150 mg total) by mouth daily.  Marland Kitchen erythromycin ophthalmic ointment Place 1 application into both eyes 4 (four) times daily. Apply 1 cm ribbon into affected eye four times daily for 7 days.  . fluticasone (FLONASE) 50 MCG/ACT nasal spray Place 2 sprays into both nostrils daily.  . methylphenidate (CONCERTA) 36 MG PO CR tablet Take 1 tablet (36 mg total) by mouth daily before breakfast.  . [START ON 11/07/2020] methylphenidate (CONCERTA) 36 MG PO CR tablet Take 1  tablet (36 mg total) by mouth daily before breakfast.  . triamcinolone ointment (KENALOG) 0.1 % Apply 1 application topically 2 (two) times daily as needed (eczema flare). Do not use for more than 1 or 2 weeks.  If eczema does not improve after 2 weeks of continuous use, return to clinic.   Facility-Administered Encounter Medications as of 10/31/2020  Medication  . medroxyPROGESTERone (DEPO-PROVERA) injection 150 mg    Patient Active Problem List   Diagnosis Date Noted  . Elevated cholesterol with elevated triglycerides 07/20/2019  . Migraine without aura and without status migrainosus, not intractable 11/12/2014  . Tension headache 11/12/2014  . Childhood obesity 10/16/2014  . Migraine headache 09/18/2014  . 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 Allergy   . Asthma     Past Medical History:  Diagnosis Date  . Allergy   . Asthma   . Morbid obesity (HCC)   . 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 Interim medical history since our last visit reviewed.  Review of Systems Per HPI unless specifically indicated above     Objective:    BP 110/82   Pulse 97   Temp 97.9 F (36.6 C)   Ht 5\' 1"  (1.549 m)   Wt 190 lb (86.2 kg)   LMP 10/16/2020   SpO2 97%   BMI 35.90 kg/m   Wt Readings from Last 3 Encounters:  10/31/20 190 lb (86.2 kg)  08/06/20 209 lb (94.8  kg) (98 %, Z= 2.07)*  07/25/20 210 lb (95.3 kg) (98 %, Z= 2.08)*   * Growth percentiles are based on CDC (Girls, 2-20 Years) data.    Physical Exam Vitals and nursing note reviewed.  Constitutional:      General: She is not in acute distress.    Appearance: Normal appearance. She is not toxic-appearing.  HENT:     Head: Normocephalic and atraumatic.     Nose: Nose normal. No congestion or rhinorrhea.     Mouth/Throat:     Mouth: Mucous membranes are moist.     Pharynx: Oropharynx is clear.  Eyes:     General: Lids are normal. No allergic  shiner, visual field deficit or scleral icterus.       Right eye: Discharge present. No foreign body or hordeolum.        Left eye: Discharge present.No foreign body or hordeolum.     Extraocular Movements: Extraocular movements intact.     Right eye: Normal extraocular motion.     Left eye: Normal extraocular motion.     Conjunctiva/sclera: Conjunctivae normal.     Right eye: Right conjunctiva is not injected. No hemorrhage.    Left eye: Left conjunctiva is not injected. No hemorrhage.    Pupils: Pupils are equal, round, and reactive to light.  Neurological:     Mental Status: She is alert.       Assessment & Plan:  1. Redness and discharge of eye Acute.  Examination today normal with exception of minimal amount of dried exudate noted to lower eyelashes.  Corneas without apparent abrasions.  Concern for bacterial conjunctivitis with waking up with eyelids matted shut.  Will start on erythromycin ointment.  Return to clinic early next week if symptoms or not improving.  - erythromycin ophthalmic ointment; Place 1 application into both eyes 4 (four) times daily. Apply 1 cm ribbon into affected eye four times daily for 7 days.  Dispense: 3.5 g; Refill: 0    Follow up plan: Return if symptoms worsen or fail to improve.

## 2020-12-08 NOTE — Progress Notes (Signed)
Virtual Visit via Video Note  I connected with Felicia Martinez on 12/11/20 at  4:30 PM EDT by a video enabled telemedicine application and verified that I am speaking with the correct person using two identifiers.  Location: Patient: home Provider: office Persons participated in the visit- patient, provider    I discussed the limitations of evaluation and management by telemedicine and the availability of in person appointments. The patient expressed understanding and agreed to proceed.  I discussed the assessment and treatment plan with the patient. The patient was provided an opportunity to ask questions and all were answered. The patient agreed with the plan and demonstrated an understanding of the instructions.   The patient was advised to call back or seek an in-person evaluation if the symptoms worsen or if the condition fails to improve as anticipated.  I provided 15 minutes of non-face-to-face time during this encounter.   Felicia Clay, MD    [facility] MD/PA/NP OP Progress Note  12/11/2020 5:02 PM CRYSTOL Martinez  MRN:  [id number]  Chief Complaint:  Chief Complaint   Follow-up; Depression    HPI:  This is a follow-up appointment for depression and ADHD.  She states that she has been busy at work.  Although there was a time she felt stressed and depressed especially around menstrual cycle, she has been doing good otherwise.  She also states that she started a new relationship about a month ago.  She met him online, and lives in [place].  She wants to make sure things before she needs him in person.  She tends to play games at night with her friends.  She was doing it until 5:00 this morning.  She agrees not to play video games late during the day.  She denies anhedonia.  Although she feels fatigued at times, it is usually related to menstrual cycle.  She has decreased appetite.  She needs to remind herself to eat.  However, she thinks this is a big bonus from the medication given  she used to be overweight.  She agrees to try to eat healthy food at least.  She denies SI.  She notices that she tends to be disorganized, not able to concentrate when she forgot to take Concerta.  She denies any issues with attention when she is on medication.   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 Hope to become a Pharmacist, hospital. Took honors in high school She grew up in [place], "she describes her childhood as "nice childhood though poor." Her parents are from [place], and separated. She has good relationship with both of her parents   Wt Readings from Last 3 Encounters:  10/31/20 190 lb (86.2 kg)  08/06/20 209 lb (94.8 kg) (98 %, Z= 2.07)*  07/25/20 210 lb (95.3 kg) (98 %, Z= 2.08)*   * Growth percentiles are based on CDC (Girls, 2-20 Years) data.     Visit Diagnosis:    ICD-10-CM   1. MDD (major depressive disorder), recurrent, in partial remission (Felicia Martinez)  F33.41     2. Attention deficit hyperactivity disorder (ADHD), unspecified ADHD type  F90.9     3. Insomnia, unspecified type  G47.00       Past Psychiatric History: Please see initial evaluation for  full details. I have reviewed the history. No updates at this time.     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  Family Psychiatric History: Please see initial evaluation for full details. I have reviewed the history. No updates at this time.     Family History:  Family History  Problem Relation Age of Onset   Hypertension Mother    Anxiety disorder Mother    Depression Mother    Lung cancer Maternal  Grandmother    Schizophrenia Maternal Aunt     Social History:  Social History   Socioeconomic History   Marital status: Single    Spouse name: Not on file   Number of children: Not on file   Years of education: Not on file   Highest education level: Not on file  Occupational History   Not on file  Tobacco Use   Smoking status: Never   Smokeless tobacco: Never  Vaping Use   Vaping Use: Never used  Substance and Sexual Activity   Alcohol use: Yes    Comment: occasional   Drug use: No   Sexual activity: Yes    Birth control/protection: Condom    Comment: menarch age 26.  Lives with mom.  Parents separated.  Other Topics Concern   Not on file  Social History Narrative   In 9th grade at [employer]. She is performing good in school.  No regular exercise.  More than 3 hours of screen time per day.  Enjoys watching netflix, writing poetry and talking to friends.    Social Determinants of Health   Financial Resource Strain: Not on file  Food Insecurity: Food Insecurity Present   Worried About [place] in the Last Year: Sometimes true   Ran Out of Food in the Last Year: Sometimes true  Transportation Needs: No Transportation Needs   Lack of Transportation (Medical): No   Lack of Transportation (Non-Medical): No  Physical Activity: Inactive   Days of Exercise per Week: 0 days   Minutes of Exercise per Session: 0 min  Stress: Stress Concern Present   Feeling of Stress : Very much  Social Connections: Socially Isolated   Frequency of Communication with Friends and Family: Twice a week   Frequency of Social Gatherings with Friends and Family: Once a week   Attends Religious Services: Never   Marine scientist or Organizations: No   Attends Archivist Meetings: Never   Marital Status: Never married    Allergies:  Allergies  Allergen Reactions   Other     Seasonal Allergies-pollen    Metabolic Disorder Labs: Lab Results  Component  Value Date   HGBA1C 5.4 08/06/2020   MPG 108 08/06/2020   MPG 114 10/18/2019   Lab Results  Component Value Date   PROLACTIN 8.8 01/17/2015   Lab Results  Component Value Date   CHOL 169 10/18/2019   TRIG 153 (H) 10/18/2019   HDL 44 (L) 10/18/2019   CHOLHDL 3.8 10/18/2019   VLDL 35 (H) 08/02/2015   LDLCALC 100 10/18/2019   LDLCALC 110 (H) 08/02/2015   Lab Results  Component Value Date   TSH 1.30 03/07/2020   TSH 1.067 01/17/2015    Therapeutic Level Labs: No results found for: LITHIUM No results found for: VALPROATE No components found  for:  CBMZ  Current Medications: Current Outpatient Medications  Medication Sig Dispense Refill   methylphenidate (CONCERTA) 36 MG PO CR tablet Take 1 tablet (36 mg total) by mouth daily before breakfast. 30 tablet 0   [START ON 01/10/2021] methylphenidate (CONCERTA) 36 MG PO CR tablet Take 1 tablet (36 mg total) by mouth daily before breakfast. 30 tablet 0   [START ON 02/09/2021] methylphenidate (CONCERTA) 36 MG PO CR tablet Take 1 tablet (36 mg total) by mouth daily before breakfast. 30 tablet 0   buPROPion (WELLBUTRIN XL) 150 MG 24 hr tablet Take 1 tablet (150 mg total) by mouth daily. 90 tablet 0   erythromycin ophthalmic ointment Place 1 application into both eyes 4 (four) times daily. Apply 1 cm ribbon into affected eye four times daily for 7 days. 3.5 g 0   fluticasone (FLONASE) 50 MCG/ACT nasal spray Place 2 sprays into both nostrils daily. 16 g 6   methylphenidate (CONCERTA) 36 MG PO CR tablet Take 1 tablet (36 mg total) by mouth daily before breakfast. 30 tablet 0   methylphenidate (CONCERTA) 36 MG PO CR tablet Take 1 tablet (36 mg total) by mouth daily before breakfast. 30 tablet 0   triamcinolone ointment (KENALOG) 0.1 % Apply 1 application topically 2 (two) times daily as needed (eczema flare). Do not use for more than 1 or 2 weeks.  If eczema does not improve after 2 weeks of continuous use, return to clinic. 30 g 0   Current  Facility-Administered Medications  Medication Dose Route Frequency Provider Last Rate Last Admin   medroxyPROGESTERone (DEPO-PROVERA) injection 150 mg  150 mg Intramuscular Q90 days Susy Frizzle, MD   150 mg at 04/27/19 0815     Musculoskeletal: Strength & Muscle Tone:  N/A Gait & Station:  N/A Patient leans: N/A  Psychiatric Specialty Exam: Review of Systems  Psychiatric/Behavioral:  Positive for decreased concentration, dysphoric mood and sleep disturbance. Negative for agitation, behavioral problems, confusion, hallucinations, self-injury and suicidal ideas. The patient is not nervous/anxious and is not hyperactive.   All other systems reviewed and are negative.  There were no vitals taken for this visit.There is no height or weight on file to calculate BMI.  General Appearance: Fairly Groomed  Eye Contact:  Good  Speech:  Clear and Coherent  Volume:  Normal  Mood:   good  Affect:  Appropriate, Congruent, and euthymic  Thought Process:  Coherent  Orientation:  Full (Time, Place, and Person)  Thought Content: Logical   Suicidal Thoughts:  No  Homicidal Thoughts:  No  Memory:  Immediate;   Good  Judgement:  Good  Insight:  Good  Psychomotor Activity:  Normal  Concentration:  Concentration: Good and Attention Span: Good  Recall:  Good  Fund of Knowledge: Good  Language: Good  Akathisia:  No  Handed:  Right  AIMS (if indicated): not done  Assets:  Communication Skills Desire for Improvement  ADL's:  Intact  Cognition: WNL  Sleep:  Poor   Screenings: GAD-7    Flowsheet Row Office Visit from 12/21/2019 in [facility]  Total GAD-7 Score 5      PHQ2-9    Flowsheet Row Video Visit from 10/08/2020 in [place] Video Visit from 09/10/2020 in [place] Office Visit from 07/25/2020 in [place] Office Visit from 12/21/2019 in [place] Office Visit from 08/29/2018 in  [place]  PHQ-2 Total Score 0 1 0  2 0  PHQ-9 Total Score -- -- 1 14 --      Flowsheet Row Video Visit from 12/11/2020 in [place] Video Visit from 10/08/2020 in [place] Video Visit from 09/10/2020 in [place] No Risk No Risk No Risk        Assessment and Plan:  Felicia Martinez is a 20 y.o. year old female with a history of depression, who presents for follow up appointment for below.   1. MDD (major depressive disorder), recurrent, in partial remission ([place]) She denies significant mood symptoms except feeling depressed at times in the context of work and a new relationship.  Will continue current dose of bupropion to target depression.   2. Attention deficit hyperactivity disorder (ADHD), unspecified ADHD type She reports great benefit from Concerta.  Will continue current dose to target ADHD.  Noted that she has had appetite loss since being started on this medication; she agrees to continue to eat healthy diet at least 2 meals per day.  We will continue to monitor, and make adjustments as needed at the next visit.   3. Insomnia, unspecified type She has disturbed circadian rhythm, which is mainly attributable to playing games at night.  Coached sleep hygiene. She was seen by neurologist for evaluation of sleep apnea.  Pending home sleep study.    Plan 1. Continue bupropion 150 mg daily  2. Continue Concerta 36 mg daily   3. Next appointment: 9/8 at 3 PM for 30 mins, video   This clinician has discussed the side effect associated with medication prescribed during this encounter. Please refer to notes in the previous encounters for more details.     Past trials of medication: Abilify (weight gain) Trazodone, melatonin (felt sad)   The patient demonstrates the following risk factors for suicide: Chronic risk factors for suicide include:  psychiatric disorder of depression. Acute risk factors for suicide include: N/A. Protective factors for this patient include: positive social support, coping skills and hope for the future. Considering these factors, the overall suicide risk at this point appears to be low. Patient is appropriate for outpatient follow up   Felicia Clay, MD 12/11/2020, 5:02 PM

## 2020-12-10 ENCOUNTER — Telehealth: Payer: Medicaid Other | Admitting: Psychiatry

## 2020-12-11 ENCOUNTER — Encounter: Payer: Self-pay | Admitting: Psychiatry

## 2020-12-11 ENCOUNTER — Other Ambulatory Visit: Payer: Self-pay

## 2020-12-11 ENCOUNTER — Telehealth (INDEPENDENT_AMBULATORY_CARE_PROVIDER_SITE_OTHER): Payer: Medicaid Other | Admitting: Psychiatry

## 2020-12-11 DIAGNOSIS — F909 Attention-deficit hyperactivity disorder, unspecified type: Secondary | ICD-10-CM

## 2020-12-11 DIAGNOSIS — G4700 Insomnia, unspecified: Secondary | ICD-10-CM | POA: Diagnosis not present

## 2020-12-11 DIAGNOSIS — F3341 Major depressive disorder, recurrent, in partial remission: Secondary | ICD-10-CM | POA: Diagnosis not present

## 2020-12-11 MED ORDER — METHYLPHENIDATE HCL ER (OSM) 36 MG PO TBCR: 36.0000 mg | EXTENDED_RELEASE_TABLET | Freq: Every day | ORAL | 0 refills | Status: DC

## 2020-12-11 MED ORDER — BUPROPION HCL ER (XL) 150 MG PO TB24: 150.0000 mg | ORAL_TABLET | Freq: Every day | ORAL | 0 refills | Status: DC

## 2020-12-11 NOTE — Patient Instructions (Signed)
1. Continue bupropion 150 mg daily  2. Continue Concerta 36 mg daily   3. Next appointment: 9/8 at 3 PM

## 2021-02-16 ENCOUNTER — Other Ambulatory Visit: Payer: Self-pay

## 2021-02-16 ENCOUNTER — Encounter (HOSPITAL_COMMUNITY): Payer: Self-pay | Admitting: Emergency Medicine

## 2021-02-16 ENCOUNTER — Emergency Department (HOSPITAL_COMMUNITY)
Admission: EM | Admit: 2021-02-16 | Discharge: 2021-02-16 | Disposition: A | Discharge status: Home / Self Care | Payer: Medicaid Other | Attending: Emergency Medicine | Admitting: Emergency Medicine

## 2021-02-16 ENCOUNTER — Emergency Department (HOSPITAL_COMMUNITY): Payer: Medicaid Other

## 2021-02-16 DIAGNOSIS — R1010 Upper abdominal pain, unspecified: Secondary | ICD-10-CM

## 2021-02-16 DIAGNOSIS — D72829 Elevated white blood cell count, unspecified: Secondary | ICD-10-CM | POA: Diagnosis not present

## 2021-02-16 DIAGNOSIS — R1011 Right upper quadrant pain: Secondary | ICD-10-CM | POA: Diagnosis present

## 2021-02-16 DIAGNOSIS — J45909 Unspecified asthma, uncomplicated: Secondary | ICD-10-CM | POA: Diagnosis not present

## 2021-02-16 DIAGNOSIS — R1110 Vomiting, unspecified: Secondary | ICD-10-CM | POA: Diagnosis not present

## 2021-02-16 DIAGNOSIS — K529 Noninfective gastroenteritis and colitis, unspecified: Secondary | ICD-10-CM | POA: Diagnosis not present

## 2021-02-16 DIAGNOSIS — R109 Unspecified abdominal pain: Secondary | ICD-10-CM | POA: Diagnosis not present

## 2021-02-16 LAB — URINALYSIS, ROUTINE W REFLEX MICROSCOPIC
Bilirubin Urine: NEGATIVE
Glucose, UA: NEGATIVE mg/dL
Hgb urine dipstick: NEGATIVE
Ketones, ur: 5 mg/dL — AB
Leukocytes,Ua: NEGATIVE
Nitrite: NEGATIVE
Protein, ur: NEGATIVE mg/dL
Specific Gravity, Urine: 1.016 (ref 1.005–1.030)
pH: 8 (ref 5.0–8.0)

## 2021-02-16 LAB — COMPREHENSIVE METABOLIC PANEL
ALT: 18 U/L (ref 0–44)
AST: 19 U/L (ref 15–41)
Albumin: 4.3 g/dL (ref 3.5–5.0)
Alkaline Phosphatase: 67 U/L (ref 38–126)
Anion gap: 6 (ref 5–15)
BUN: 12 mg/dL (ref 6–20)
CO2: 23 mmol/L (ref 22–32)
Calcium: 9 mg/dL (ref 8.9–10.3)
Chloride: 106 mmol/L (ref 98–111)
Creatinine, Ser: 0.58 mg/dL (ref 0.44–1.00)
GFR, Estimated: >60 mL/min (ref >60)
Glucose, Bld: 115 mg/dL — ABNORMAL HIGH (ref 70–99)
Potassium: 3.6 mmol/L (ref 3.5–5.1)
Sodium: 135 mmol/L (ref 135–145)
Total Bilirubin: 0.4 mg/dL (ref 0.3–1.2)
Total Protein: 7.7 g/dL (ref 6.5–8.1)

## 2021-02-16 LAB — CBC WITH DIFFERENTIAL/PLATELET
Abs Immature Granulocytes: 0.08 10*3/uL — ABNORMAL HIGH (ref 0.00–0.07)
Basophils Absolute: 0 10*3/uL (ref 0.0–0.1)
Basophils Relative: 0 %
Eosinophils Absolute: 0 10*3/uL (ref 0.0–0.5)
Eosinophils Relative: 0 %
HCT: 38 % (ref 36.0–46.0)
Hemoglobin: 12.5 g/dL (ref 12.0–15.0)
Immature Granulocytes: 1 %
Lymphocytes Relative: 6 %
Lymphs Abs: 0.9 10*3/uL (ref 0.7–4.0)
MCH: 29.8 pg (ref 26.0–34.0)
MCHC: 32.9 g/dL (ref 30.0–36.0)
MCV: 90.7 fL (ref 80.0–100.0)
Monocytes Absolute: 0.2 10*3/uL (ref 0.1–1.0)
Monocytes Relative: 2 %
Neutro Abs: 13.9 10*3/uL — ABNORMAL HIGH (ref 1.7–7.7)
Neutrophils Relative %: 91 %
Platelets: 304 10*3/uL (ref 150–400)
RBC: 4.19 MIL/uL (ref 3.87–5.11)
RDW: 13.1 % (ref 11.5–15.5)
WBC: 15.1 10*3/uL — ABNORMAL HIGH (ref 4.0–10.5)
nRBC: 0 % (ref 0.0–0.2)

## 2021-02-16 LAB — LIPASE, BLOOD: Lipase: 21 U/L (ref 11–51)

## 2021-02-16 LAB — PREGNANCY, URINE: Preg Test, Ur: NEGATIVE

## 2021-02-16 MED ORDER — SODIUM CHLORIDE 0.9 % IV BOLUS
1000.0000 mL | Freq: Once | INTRAVENOUS | Status: AC
  Administered 2021-02-16: 1000 mL via INTRAVENOUS

## 2021-02-16 MED ORDER — HYDROMORPHONE HCL 1 MG/ML IJ SOLN
1.0000 mg | Freq: Once | INTRAMUSCULAR | Status: AC
  Administered 2021-02-16: 1 mg via INTRAVENOUS
  Filled 2021-02-16: qty 1

## 2021-02-16 MED ORDER — SUCRALFATE 1 GM/10ML PO SUSP: 1.0000 g | Freq: Three times a day (TID) | ORAL | 0 refills | Status: DC

## 2021-02-16 MED ORDER — PANTOPRAZOLE SODIUM 40 MG PO TBEC: 40.0000 mg | DELAYED_RELEASE_TABLET | Freq: Every day | ORAL | 0 refills | Status: DC

## 2021-02-16 MED ORDER — IOHEXOL 350 MG/ML SOLN
85.0000 mL | Freq: Once | INTRAVENOUS | Status: AC | PRN
  Administered 2021-02-16: 85 mL via INTRAVENOUS

## 2021-02-16 MED ORDER — ONDANSETRON HCL 4 MG/2ML IJ SOLN
4.0000 mg | Freq: Once | INTRAMUSCULAR | Status: AC
  Administered 2021-02-16: 4 mg via INTRAVENOUS
  Filled 2021-02-16: qty 2

## 2021-02-16 MED ORDER — ONDANSETRON 4 MG PO TBDP: 4.0000 mg | ORAL_TABLET | Freq: Three times a day (TID) | ORAL | 0 refills | Status: DC | PRN

## 2021-02-16 NOTE — Discharge Instructions (Addendum)
If you develop worsening, continued, or recurrent abdominal pain, uncontrolled vomiting, fever, chest or back pain, or any other new/concerning symptoms then return to the ER for evaluation.   IMPORTANT PATIENT INSTRUCTIONS:  Your ED provider has recommended an Outpatient Ultrasound.  Please call [phone] to schedule an appointment.  If your appointment is scheduled for a Saturday, Sunday or holiday, please go to the  Emergency Department Registration Desk at least 15 minutes prior to your appointment time and tell them you are there for an ultrasound.    If your appointment is scheduled for a weekday (Monday-Friday), please go directly to the  Radiology Department at least 15 minutes prior to your appointment time and tell them you are there for an ultrasound.  Please call [phone] with questions. 

## 2021-02-16 NOTE — ED Triage Notes (Signed)
Pt to the ED with abdominal pain since she woke this morning.  Pt has vomiting 4 times today and has used several OTC medications without relief.

## 2021-02-16 NOTE — ED Notes (Signed)
Informed pt of need for urine specimen for testing.

## 2021-02-16 NOTE — ED Provider Notes (Signed)
[facility] EMERGENCY DEPARTMENT Provider Note   CSN: [id number] Arrival date & time: 02/16/21  1646     History Chief Complaint  Patient presents with   Abdominal Pain    Felicia Martinez is a 20 y.o. female.  HPI 20 year old female presents with upper abdominal pain and vomiting.  This started this morning and she has had multiple episodes of emesis.  No blood in her emesis.  She is had about 3 episodes of diarrhea that she does not think were bloody.  The pain has been constant and severe.  She took ibuprofen, Mylanta and Pepto-Bismol but threw up these medications.  She has not had a fever.  No shortness of breath.  The pain seems to wrap around to her bilateral back.  No urinary symptoms. No prior abdominal problems.   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 No pertinent surgical history.   OB History   No obstetric history on file.     Family History  Problem Relation Age of Onset   Hypertension Mother    Anxiety disorder Mother    Depression Mother    Lung cancer Maternal Grandmother    Schizophrenia Maternal Aunt     Social History   Tobacco Use   Smoking status: Never   Smokeless tobacco: Never  Vaping Use   Vaping Use: Never used  Substance Use Topics   Alcohol use: Yes    Comment: occasional   Drug use: No    Home Medications Prior to Admission medications   Medication Sig Start Date End Date Taking? Authorizing Provider  acetaminophen (TYLENOL) 500 MG tablet Take 1,000 mg by mouth every 6 (six) hours as needed.   Yes [provider]  alum & mag hydroxide-simeth (MAALOX/MYLANTA) 200-200-20 MG/5ML suspension Take 5 mLs by mouth every 6 (six) hours as needed for indigestion or heartburn.   Yes [provider]  bismuth subsalicylate (PEPTO BISMOL) 262 MG/15ML suspension Take 30 mLs by mouth every 6 (six) hours as needed.   Yes [provider]  buPROPion (WELLBUTRIN XL) 150 MG 24 hr tablet Take 1 tablet (150 mg total) by mouth daily. 12/11/20 03/11/21 Yes Neysa Hotter, MD  methylphenidate (CONCERTA) 36 MG PO CR tablet Take 1 tablet (36 mg total) by mouth daily before breakfast. 02/09/21 03/11/21 Yes Hisada, Barbee Cough, MD  ondansetron (ZOFRAN ODT) 4 MG disintegrating tablet Take 1 tablet (4 mg total) by mouth every 8 (eight) hours as needed for nausea or vomiting. 02/16/21  Yes Pricilla Loveless, MD  pantoprazole (PROTONIX) 40 MG tablet Take 1 tablet (40 mg total) by mouth daily. 02/16/21  Yes Pricilla Loveless, MD  Phenyleph-Doxylamine-DM-APAP (ALKA SELTZER PLUS PO) Take 2 tablets by mouth daily as needed (stomach acid).   Yes [provider]  sucralfate (CARAFATE) 1 GM/10ML suspension Take 10 mLs (1 g total) by mouth 4 (four) times daily -  with meals and at bedtime. 02/16/21  Yes Pricilla Loveless, MD  triamcinolone ointment (KENALOG) 0.1 % Apply 1 application topically 2 (two) times daily as  needed (eczema flare). Do not use for more than 1 or 2 weeks.  If eczema does not improve after 2 weeks of continuous use, return to clinic. 07/25/20  Yes Cathlean MarseillesMartinez, Jessica A, NP  erythromycin ophthalmic ointment Place 1 application into both eyes 4 (four) times daily. Apply 1 cm ribbon into affected eye four times daily for 7 days. Patient not taking: Reported on 02/16/2021 10/31/20   Valentino NoseMartinez, Jessica A, NP  fluticasone [facility](FLONASE) 50 MCG/ACT nasal spray Place 2 sprays into both nostrils daily. Patient not taking: Reported on 02/16/2021 10/20/19   Donita BrooksPickard, Warren T, MD  methylphenidate (CONCERTA) 36 MG PO CR tablet Take 1  tablet (36 mg total) by mouth daily before breakfast. 10/08/20 11/07/20  Neysa HotterHisada, Reina, MD  methylphenidate (CONCERTA) 36 MG PO CR tablet Take 1 tablet (36 mg total) by mouth daily before breakfast. Patient not taking: Reported on 02/16/2021 11/07/20 12/07/20  Neysa HotterHisada, Reina, MD  methylphenidate (CONCERTA) 36 MG PO CR tablet Take 1 tablet (36 mg total) by mouth daily before breakfast. Patient not taking: Reported on 02/16/2021 12/11/20 01/10/21  Neysa HotterHisada, Reina, MD  methylphenidate (CONCERTA) 36 MG PO CR tablet Take 1 tablet (36 mg total) by mouth daily before breakfast. Patient not taking: Reported on 02/16/2021 01/10/21 02/16/21  Neysa HotterHisada, Reina, MD    Allergies    Other  Review of Systems   Review of Systems  Constitutional:  Negative for fever.  Cardiovascular:  Negative for chest pain.  Gastrointestinal:  Positive for abdominal pain, diarrhea and vomiting. Negative for blood in stool.  Genitourinary:  Negative for dysuria.  Musculoskeletal:  Positive for back pain.  All other systems reviewed and are negative.  Physical Exam Updated Vital Signs BP 109/74 (BP Location: Right Arm)   Pulse 83   Temp 99.1 F (37.3 C) (Oral)   Resp 17   Ht 5\' 1"  (1.549 m)   Wt 86.2 kg   LMP 02/03/2021 (Approximate)   SpO2 100%   BMI 35.91 kg/m   Physical Exam Vitals and nursing note reviewed.  Constitutional:      General: She is not in acute distress.    Appearance: She is well-developed. She is not ill-appearing or diaphoretic.  HENT:     Head: Normocephalic and atraumatic.     Right Ear: External ear normal.     Left Ear: External ear normal.     Nose: Nose normal.  Eyes:     General:        Right eye: No discharge.        Left eye: No discharge.  Cardiovascular:     Rate and Rhythm: Normal rate and regular rhythm.     Heart sounds: Normal heart sounds.  Pulmonary:     Effort: Pulmonary effort is normal.     Breath sounds: Normal breath sounds.  Abdominal:     Palpations: Abdomen is soft.      Tenderness: There is abdominal tenderness in the right upper quadrant and left upper quadrant.  Skin:    General: Skin is warm and dry.  Neurological:     Mental Status: She is alert.  Psychiatric:        Mood and Affect: Mood is not anxious.    ED Results / Procedures / Treatments   Labs (all labs ordered are listed, but only abnormal results are displayed) Labs Reviewed  COMPREHENSIVE METABOLIC PANEL - Abnormal; Notable for the following components:      Result Value   Glucose, Bld 115 (*)  All other components within normal limits  CBC WITH DIFFERENTIAL/PLATELET - Abnormal; Notable for the following components:   WBC 15.1 (*)    Neutro Abs 13.9 (*)    Abs Immature Granulocytes 0.08 (*)    All other components within normal limits  URINALYSIS, ROUTINE W REFLEX MICROSCOPIC - Abnormal; Notable for the following components:   Ketones, ur 5 (*)    All other components within normal limits  LIPASE, BLOOD  PREGNANCY, URINE  I-STAT BETA HCG BLOOD, ED ([facility], [facility], [facility] ONLY)    EKG None  Radiology CT ABDOMEN PELVIS W CONTRAST  Result Date: 02/16/2021 CLINICAL DATA:  Nausea, vomiting, mid abdominal pain EXAM: CT ABDOMEN AND PELVIS WITH CONTRAST TECHNIQUE: Multidetector CT imaging of the abdomen and pelvis was performed using the standard protocol following bolus administration of intravenous contrast. CONTRAST:  23mL OMNIPAQUE IOHEXOL 350 MG/ML SOLN COMPARISON:  None. FINDINGS: Lower chest: No acute abnormality. Hepatobiliary: Mild focal fatty hepatic infiltration adjacent the falciform ligament. Liver otherwise unremarkable. No intra or extrahepatic biliary ductal dilation. Gallbladder unremarkable. Pancreas: Unremarkable Spleen: Unremarkable Adrenals/Urinary Tract: Adrenal glands are unremarkable. Kidneys are normal, without renal calculi, focal lesion, or hydronephrosis. Bladder is unremarkable. Stomach/Bowel: Stomach is within normal limits. Appendix appears normal. No evidence of  bowel wall thickening, distention, or inflammatory changes. No free intraperitoneal gas or fluid. Vascular/Lymphatic: No significant vascular findings are present. No enlarged abdominal or pelvic lymph nodes. Reproductive: Uterus and bilateral adnexa are unremarkable. Other: No abdominal wall hernia.  Rectum unremarkable. Musculoskeletal: No acute bone abnormality. No lytic or blastic bone lesion. IMPRESSION: No acute intra-abdominal pathology identified. No definite radiographic explanation for the patient's reported symptoms. Electronically Signed   By: Helyn Numbers M.D.   On: 02/16/2021 20:55    Procedures Procedures   Medications Ordered in ED Medications  sodium chloride 0.9 % bolus 1,000 mL (0 mLs Intravenous Stopped 02/16/21 1930)  HYDROmorphone (DILAUDID) injection 1 mg (1 mg Intravenous Given 02/16/21 1818)  ondansetron (ZOFRAN) injection 4 mg (4 mg Intravenous Given 02/16/21 1813)  iohexol (OMNIPAQUE) 350 MG/ML injection 85 mL (85 mLs Intravenous Contrast Given 02/16/21 2029)    ED Course  I have reviewed the triage vital signs and the nursing notes.  Pertinent labs & imaging results that were available during my care of the patient were reviewed by me and considered in my medical decision making (see chart for details).    MDM Rules/Calculators/A&P                           Patient appears to have an acute gastroenteritis. Has a leukocytosis, which is probably reactive. Otherwise labs are unremarkable.  I had prefer to get a right upper quadrant ultrasound given the upper abdominal discomfort but this is not available at this facility at this time.  CT is unremarkable.  I will have her come back for an ultrasound tomorrow but otherwise I think she is stable for discharge home if she is tolerating p.o. and is currently stable.  Low suspicion for cholecystitis but may have some symptomatic gallstones.  More likely she just has gastroenteritis with upper abdominal discomfort.  Given  return precautions. Final Clinical Impression(s) / ED Diagnoses Final diagnoses:  Upper abdominal pain  Acute gastroenteritis    Rx / DC Orders ED Discharge Orders          Ordered    ondansetron (ZOFRAN ODT) 4 MG disintegrating tablet  Every 8 hours PRN  02/16/21 2134    pantoprazole (PROTONIX) 40 MG tablet  Daily        02/16/21 2134    sucralfate (CARAFATE) 1 GM/10ML suspension  3 times daily with meals & bedtime        02/16/21 2134    US Abdomen Limited RUQ/Gall Gladder        02/16/21 2135             Pricilla Loveless, MD 02/16/21 2344

## 2021-02-17 ENCOUNTER — Telehealth: Payer: Self-pay

## 2021-02-17 NOTE — Telephone Encounter (Signed)
Transition Care Management Unsuccessful Follow-up Telephone Call  Date of discharge and from where:  02/16/2021-[place]   Attempts:  1st Attempt  Reason for unsuccessful TCM follow-up call:  Left voice message

## 2021-02-18 NOTE — Telephone Encounter (Signed)
Transition Care Management Follow-up Telephone Call Date of discharge and from where: 02/16/2021-[place]  How have you been since you were released from the hospital? Patient stated she is feeling better.  Any questions or concerns? No  Items Reviewed: Did the pt receive and understand the discharge instructions provided? Yes  Medications obtained and verified? Yes  Other? No  Any new allergies since your discharge? No  Dietary orders reviewed? N/A Do you have support at home? Yes   Home Care and Equipment/Supplies: Were home health services ordered? not applicable If so, what is the name of the agency? N/A  Has the agency set up a time to come to the patient's home? not applicable Were any new equipment or medical supplies ordered?  No What is the name of the medical supply agency? N/A Were you able to get the supplies/equipment? not applicable Do you have any questions related to the use of the equipment or supplies? No  Functional Questionnaire: (I = Independent and D = Dependent) ADLs: I  Bathing/Dressing- I  Meal Prep- I  Eating- I  Maintaining continence- I  Transferring/Ambulation- I  Managing Meds- I  Follow up appointments reviewed:  PCP Hospital f/u appt confirmed? No   Specialist Hospital f/u appt confirmed? No   Are transportation arrangements needed? No  If their condition worsens, is the pt aware to call PCP or go to the Emergency Dept.? Yes Was the patient provided with contact information for the PCP's office or ED? Yes Was to pt encouraged to call back with questions or concerns? Yes

## 2021-02-25 ENCOUNTER — Ambulatory Visit (HOSPITAL_COMMUNITY)
Admission: RE | Admit: 2021-02-25 | Discharge: 2021-02-25 | Disposition: A | Discharge status: Home / Self Care | Payer: Medicaid Other | Source: Ambulatory Visit | Attending: Emergency Medicine | Admitting: Emergency Medicine

## 2021-02-25 ENCOUNTER — Other Ambulatory Visit: Payer: Self-pay

## 2021-02-25 DIAGNOSIS — R1011 Right upper quadrant pain: Secondary | ICD-10-CM | POA: Diagnosis not present

## 2021-02-25 DIAGNOSIS — K8020 Calculus of gallbladder without cholecystitis without obstruction: Secondary | ICD-10-CM | POA: Diagnosis not present

## 2021-02-25 NOTE — ED Provider Notes (Signed)
1:20 PM Patient awake, alert, sitting upright.  We discussed ultrasound results, performed today, 1 week after her evaluation.  No distress, no reported new concerns.  Patient found to have gallstones, aware of need to follow-up with primary care and surgery for consideration of removal.  She is also aware of return precautions.   Gerhard Munch, MD 02/25/21 1320

## 2021-02-28 ENCOUNTER — Ambulatory Visit (INDEPENDENT_AMBULATORY_CARE_PROVIDER_SITE_OTHER): Payer: Medicaid Other | Admitting: Family Medicine

## 2021-02-28 ENCOUNTER — Encounter: Payer: Self-pay | Admitting: Family Medicine

## 2021-02-28 ENCOUNTER — Other Ambulatory Visit: Payer: Self-pay

## 2021-02-28 VITALS — BP 118/62 | HR 72 | Temp 98.7°F | Resp 14 | Ht 61.0 in | Wt 182.0 lb

## 2021-02-28 DIAGNOSIS — K8020 Calculus of gallbladder without cholecystitis without obstruction: Secondary | ICD-10-CM

## 2021-02-28 MED ORDER — HYDROCODONE-ACETAMINOPHEN 5-325 MG PO TABS: 1.0000 | ORAL_TABLET | Freq: Four times a day (QID) | ORAL | 0 refills | Status: DC | PRN

## 2021-02-28 MED ORDER — ONDANSETRON HCL 4 MG PO TABS: 4.0000 mg | ORAL_TABLET | Freq: Three times a day (TID) | ORAL | 0 refills | Status: DC | PRN

## 2021-02-28 NOTE — Progress Notes (Signed)
Established Patient Office Visit  Subjective:  Patient ID: Felicia Martinez, female    DOB: 08/12/2000  Age: 20 y.o. MRN: [id number]  CC:  Chief Complaint  Patient presents with   Abd Pain    Was seen in ER and had [place]- Small gallstones and sludge within gallbladder without evidence of acute cholecystitis.    Patient developed severe right upper quadrant and epigastric abdominal pain on August 21.  The pain was intense and radiated into her back.  It lasted several hours.  Ultimately she went to the emergency room.  In the emergency room her white blood cell count was elevated at 15 however the remainder of her lab work was normal.  A CT scan was normal aside from fatty liver changes.  They obtained a right upper quadrant ultrasound however that showed small gallstones 1 up to 6 mm in size.  They also saw sludge within the gallbladder but no evidence of cholecystitis or ductal dilatation.  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 No pertinent surgical history.  Family History  Problem Relation Age of Onset   Hypertension Mother    Anxiety disorder Mother    Depression Mother    Lung cancer Maternal Grandmother    Schizophrenia Maternal Aunt     Social History   Socioeconomic History   Marital status: Single    Spouse name: Not on file   Number of children: Not on file   Years of education: Not on file   Highest education level: Not on file  Occupational History   Not on file  Tobacco Use   Smoking status: Never   Smokeless tobacco: Never  Vaping Use   Vaping Use: Never used  Substance and Sexual Activity   Alcohol use: Yes    Comment: occasional   Drug use: No   Sexual activity: Yes    Birth control/protection: Condom    Comment: menarch age 58.  Lives with mom.  Parents separated.  Other  Topics Concern   Not on file  Social History Narrative   In 9th grade at [employer]. She is performing good in school.  No regular exercise.  More than 3 hours of screen time per day.  Enjoys watching netflix, writing poetry and talking to friends.    Social Determinants of Health   Financial Resource Strain: Not on file  Food Insecurity: Not on file  Transportation Needs: Not on file  Physical Activity: Not on file  Stress: Not on file  Social Connections: Not on file  Intimate Partner Violence: Not on file    Outpatient Medications Prior to Visit  Medication Sig Dispense Refill   acetaminophen (TYLENOL) 500 MG tablet Take 1,000 mg by mouth every 6 (six) hours as needed.     buPROPion (WELLBUTRIN XL) 150 MG 24 hr tablet Take 1 tablet (150 mg total) by mouth daily. 90 tablet 0   methylphenidate (CONCERTA) 36 MG PO CR tablet Take 1 tablet (36 mg total) by mouth daily before breakfast. 30 tablet 0   alum & mag hydroxide-simeth (MAALOX/MYLANTA) 200-200-20 MG/5ML suspension Take 5 mLs by mouth every 6 (six) hours as needed for indigestion or heartburn.     bismuth subsalicylate (PEPTO BISMOL) 262 MG/15ML suspension Take 30 mLs by mouth  every 6 (six) hours as needed.     erythromycin ophthalmic ointment Place 1 application into both eyes 4 (four) times daily. Apply 1 cm ribbon into affected eye four times daily for 7 days. (Patient not taking: Reported on 02/16/2021) 3.5 g 0   fluticasone (FLONASE) 50 MCG/ACT nasal spray Place 2 sprays into both nostrils daily. (Patient not taking: Reported on 02/28/2021) 16 g 6   methylphenidate (CONCERTA) 36 MG PO CR tablet Take 1 tablet (36 mg total) by mouth daily before breakfast. (Patient not taking: Reported on 02/16/2021) 30 tablet 0   methylphenidate (CONCERTA) 36 MG PO CR tablet Take 1 tablet (36 mg total) by mouth daily before breakfast. (Patient not taking: Reported on 02/16/2021) 30 tablet 0   methylphenidate (CONCERTA) 36 MG PO CR tablet  Take 1 tablet (36 mg total) by mouth daily before breakfast. (Patient not taking: Reported on 02/16/2021) 30 tablet 0   methylphenidate (CONCERTA) 36 MG PO CR tablet Take 1 tablet (36 mg total) by mouth daily before breakfast. (Patient not taking: Reported on 02/28/2021) 30 tablet 0   ondansetron (ZOFRAN ODT) 4 MG disintegrating tablet Take 1 tablet (4 mg total) by mouth every 8 (eight) hours as needed for nausea or vomiting. (Patient not taking: Reported on 02/28/2021) 10 tablet 0   pantoprazole (PROTONIX) 40 MG tablet Take 1 tablet (40 mg total) by mouth daily. (Patient not taking: Reported on 02/28/2021) 14 tablet 0   Phenyleph-Doxylamine-DM-APAP (ALKA SELTZER PLUS PO) Take 2 tablets by mouth daily as needed (stomach acid).     sucralfate (CARAFATE) 1 GM/10ML suspension Take 10 mLs (1 g total) by mouth 4 (four) times daily -  with meals and at bedtime. 420 mL 0   triamcinolone ointment (KENALOG) 0.1 % Apply 1 application topically 2 (two) times daily as needed (eczema flare). Do not use for more than 1 or 2 weeks.  If eczema does not improve after 2 weeks of continuous use, return to clinic. (Patient not taking: Reported on 02/28/2021) 30 g 0   medroxyPROGESTERone (DEPO-PROVERA) injection 150 mg      No facility-administered medications prior to visit.    Allergies  Allergen Reactions   Other     Seasonal Allergies-pollen    ROS Review of Systems  All other systems reviewed and are negative.    Objective:    Physical Exam Constitutional:      Appearance: She is well-developed.  HENT:     Head: Normocephalic.  Eyes:     Pupils: Pupils are equal, round, and reactive to light.  Cardiovascular:     Rate and Rhythm: Normal rate and regular rhythm.  Pulmonary:     Effort: Pulmonary effort is normal.     Breath sounds: Normal breath sounds.  Abdominal:     General: Bowel sounds are normal. There is no distension.     Palpations: Abdomen is soft.     Tenderness: There is no abdominal  tenderness. There is no guarding.  Skin:    Findings: Rash is not nodular, papular, purpuric or pustular.       Assessment & Plan:   Calculus of gallbladder without cholecystitis without obstruction - Plan: Ambulatory referral to General Surgery I will consult general surgery.  The patient has cholelithiasis.  This is likely to happen again.  She would benefit from an elective cholecystectomy.  I will give the patient pain medication and Zofran in case the pain occurs again.  However I gave her strict instructions on when  to stay home and when to go to the hospital.  If she takes the pain medicine and the pain passes she can stay at home.  However, if the pain intensifies, or she develops fever or generalized this, she is to go to the emergency room immediately.  Patient understands  Donita Brooks, MD

## 2021-03-04 NOTE — Progress Notes (Signed)
Virtual Visit via Video Note  I connected with Felicia Martinez on 03/06/21 at  3:00 PM EDT by a video enabled telemedicine application and verified that I am speaking with the correct person using two identifiers.  Location: Patient: home Provider: office Persons participated in the visit- patient, provider    I discussed the limitations of evaluation and management by telemedicine and the availability of in person appointments. The patient expressed understanding and agreed to proceed.    I discussed the assessment and treatment plan with the patient. The patient was provided an opportunity to ask questions and all were answered. The patient agreed with the plan and demonstrated an understanding of the instructions.   The patient was advised to call back or seek an in-person evaluation if the symptoms worsen or if the condition fails to improve as anticipated.  I provided 20 minutes of non-face-to-face time during this encounter.   Felicia Hotter, MD    [facility] MD/PA/NP OP Progress Note  03/06/2021 3:32 PM Felicia Martinez  MRN:  [id number]  Chief Complaint:  Chief Complaint   Follow-up; Depression    HPI:  This is a follow-up appointment for depression, ADHD.  She states that she does not feel motivated.  Although she has been trying to go to gym a few times per week, she has not been able to do so as she has no energy.  Although she may occasionally play video games and go outside at times, she feels exhausted and has anhedonia..  The school has started since last month.  She had to drop sociology as she could not focus on things well.  She cannot think about any triggers except she had choledocholithiasis.  She has an upcoming appointment to schedule for cholecystectomy.  She denies any pain or nausea from this condition.  She has occasional insomnia.  Although she has decrease in appetite, she has been able to eat well and lately.  She denies anxiety.  She denies SI.  She denies  any recent alcohol use.  She has been using weed a few times per week; she uses this to feel relaxed and pain.  She was provided psychoeducation about potential long-term side effect from weed.   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 Took honors in high school She grew up in [place], "she describes her childhood as "nice childhood though poor." Her parents are from [place] and separated. She has good relationship with both of her parents  Visit Diagnosis:    ICD-10-CM   1. MDD (major depressive disorder), recurrent episode, moderate (HCC)  F33.1 TSH    2. Attention deficit hyperactivity disorder (ADHD), unspecified ADHD type  F90.9       Past Psychiatric History: Please see initial evaluation for full details. I have reviewed the history. No updates at this time.     Past Medical History:  Past Medical History:  Diagnosis Date   Allergy    Asthma    Morbid obesity (HCC)    Schizophrenia in children  No past surgical history on file.  Family Psychiatric History: Please see initial evaluation for full details. I have reviewed the history. No updates at this time.     Family History:  Family History  Problem Relation Age of Onset   Hypertension Mother    Anxiety disorder Mother    Depression Mother    Lung cancer Maternal Grandmother    Schizophrenia Maternal Aunt     Social History:  Social History   Socioeconomic History   Marital status: Single    Spouse name: Not on file   Number of children: Not on file   Years  of education: Not on file   Highest education level: Not on file  Occupational History   Not on file  Tobacco Use   Smoking status: Never   Smokeless tobacco: Never  Vaping Use   Vaping Use: Never used  Substance and Sexual Activity   Alcohol use: Yes    Comment: occasional   Drug use: No   Sexual activity: Yes    Birth control/protection: Condom    Comment: menarch age 56.  Lives with mom.  Parents separated.  Other Topics Concern   Not on file  Social History Narrative   In 9th grade at [employer]. She is performing good in school.  No regular exercise.  More than 3 hours of screen time per day.  Enjoys watching netflix, writing poetry and talking to friends.    Social Determinants of Health   Financial Resource Strain: Not on file  Food Insecurity: Not on file  Transportation Needs: Not on file  Physical Activity: Not on file  Stress: Not on file  Social Connections: Not on file    Allergies:  Allergies  Allergen Reactions   Other     Seasonal Allergies-pollen    Metabolic Disorder Labs: Lab Results  Component Value Date   HGBA1C 5.4 08/06/2020   MPG 108 08/06/2020   MPG 114 10/18/2019   Lab Results  Component Value Date   PROLACTIN 8.8 01/17/2015   Lab Results  Component Value Date   CHOL 169 10/18/2019   TRIG 153 (H) 10/18/2019   HDL 44 (L) 10/18/2019   CHOLHDL 3.8 10/18/2019   VLDL 35 (H) 08/02/2015   LDLCALC 100 10/18/2019   LDLCALC 110 (H) 08/02/2015   Lab Results  Component Value Date   TSH 1.30 03/07/2020   TSH 1.067 01/17/2015    Therapeutic Level Labs: No results found for: LITHIUM No results found for: VALPROATE No components found for:  CBMZ  Current Medications: Current Outpatient Medications  Medication Sig Dispense Refill   buPROPion (WELLBUTRIN XL) 300 MG 24 hr tablet Take 1 tablet (300 mg total) by mouth daily. 30 tablet 1   methylphenidate (CONCERTA) 36 MG PO CR tablet Take 1 tablet (36 mg total) by mouth daily  before breakfast. 30 tablet 0   [START ON 04/05/2021] methylphenidate (CONCERTA) 36 MG PO CR tablet Take 1 tablet (36 mg total) by mouth daily before breakfast. 30 tablet 0   acetaminophen (TYLENOL) 500 MG tablet Take 1,000 mg by mouth every 6 (six) hours as needed.     buPROPion (WELLBUTRIN XL) 150 MG 24 hr tablet Take 1 tablet (150 mg total) by mouth daily. 90 tablet 0   HYDROcodone-acetaminophen (NORCO) 5-325 MG tablet Take 1 tablet by mouth every 6 (six) hours as needed for moderate pain. 20 tablet 0   ondansetron (ZOFRAN) 4 MG tablet Take 1 tablet (4 mg total) by  mouth every 8 (eight) hours as needed for nausea or vomiting. 20 tablet 0   No current facility-administered medications for this visit.     Musculoskeletal: Strength & Muscle Tone:  N/A Gait & Station:  N/A Patient leans: N/A  Psychiatric Specialty Exam: Review of Systems  Psychiatric/Behavioral:  Positive for decreased concentration, dysphoric mood and sleep disturbance. Negative for agitation, behavioral problems, confusion, hallucinations, self-injury and suicidal ideas. The patient is not nervous/anxious and is not hyperactive.   All other systems reviewed and are negative.  There were no vitals taken for this visit.There is no height or weight on file to calculate BMI.  General Appearance: Fairly Groomed  Eye Contact:  Good  Speech:  Clear and Coherent  Volume:  Normal  Mood:  Depressed  Affect:  Appropriate, Congruent, and fatigue, down  Thought Process:  Coherent  Orientation:  Full (Time, Place, and Person)  Thought Content: Logical   Suicidal Thoughts:  No  Homicidal Thoughts:  No  Memory:  Immediate;   Good  Judgement:  Good  Insight:  Good  Psychomotor Activity:  Normal  Concentration:  Concentration: Good and Attention Span: Good  Recall:  Good  Fund of Knowledge: Good  Language: Good  Akathisia:  No  Handed:  Right  AIMS (if indicated): not done  Assets:  Communication Skills Desire for  Improvement  ADL's:  Intact  Cognition: WNL  Sleep:  Poor   Screenings: GAD-7    Flowsheet Row Office Visit from 12/21/2019 in [facility]  Total GAD-7 Score 5      PHQ2-9    Flowsheet Row Video Visit from 03/06/2021 in [facility] Video Visit from 10/08/2020 in [facility] Video Visit from 09/10/2020 in [facility] Office Visit from 07/25/2020 in [facility] Office Visit from 12/21/2019 in [facility]  PHQ-2 Total Score 2 0 1 0 2  PHQ-9 Total Score 10 -- -- 1 14      Flowsheet Row Video Visit from 03/06/2021 in [facility] ED from 02/16/2021 in [facility] EMERGENCY DEPARTMENT Video Visit from 12/11/2020 in [facility]  C-SSRS RISK CATEGORY No Risk No Risk No Risk        Assessment and Plan:  Felicia Martinez is a 20 y.o. year old female with a history of  depression, who presents for follow up appointment for below.   1. MDD (major depressive disorder), recurrent episode, moderate (HCC) There has been worsening in depressive symptoms with prominent fatigue since the last visit without significant triggers.  Psychosocial stressors includes work and new relationship.  We uptitrate bupropion to optimize treatment for depression.   2. Attention deficit hyperactivity disorder (ADHD), unspecified ADHD type She has had good benefit from Concerta up until this point.  We will continue current dose to target ADHD; will consider up titration if she continues to struggle with concentration despite improvement in her mood.    3. Insomnia, unspecified type She continues to have insomnia.  She has not had an appointment for sleep study; she is advised to contact the office.    Plan 1. Increase bupropion 300 mg daily  2. Continue Concerta 36 mg daily   3. Next appointment: 10/18 at 4:30, video 4. Obtain lab (TSH)    This clinician has discussed the side effect associated with medication prescribed during this encounter. Please refer to notes in the previous encounters for more details.  Past trials of medication: Abilify (weight gain) Trazodone, melatonin (felt sad)   The patient demonstrates the following risk factors for suicide: Chronic risk factors for suicide include: psychiatric disorder of depression. Acute risk factors for suicide include: N/A. Protective factors for this patient include: positive social support, coping skills and hope for the future. Considering these factors, the overall suicide risk at this point appears to be low. Patient is appropriate for outpatient follow up      Felicia Hottereina Shanon Seawright, MD 03/06/2021, 3:32 PM

## 2021-03-06 ENCOUNTER — Other Ambulatory Visit: Payer: Self-pay

## 2021-03-06 ENCOUNTER — Telehealth (INDEPENDENT_AMBULATORY_CARE_PROVIDER_SITE_OTHER): Payer: Medicaid Other | Admitting: Psychiatry

## 2021-03-06 ENCOUNTER — Encounter: Payer: Self-pay | Admitting: Psychiatry

## 2021-03-06 DIAGNOSIS — F331 Major depressive disorder, recurrent, moderate: Secondary | ICD-10-CM

## 2021-03-06 DIAGNOSIS — F909 Attention-deficit hyperactivity disorder, unspecified type: Secondary | ICD-10-CM

## 2021-03-06 MED ORDER — METHYLPHENIDATE HCL ER (OSM) 36 MG PO TBCR: 36.0000 mg | EXTENDED_RELEASE_TABLET | Freq: Every day | ORAL | 0 refills | Status: DC

## 2021-03-06 MED ORDER — BUPROPION HCL ER (XL) 300 MG PO TB24: 300.0000 mg | ORAL_TABLET | Freq: Every day | ORAL | 1 refills | Status: DC

## 2021-03-06 MED ORDER — METHYLPHENIDATE HCL ER (OSM) 36 MG PO TBCR
36.0000 mg | EXTENDED_RELEASE_TABLET | Freq: Every day | ORAL | 0 refills | Status: DC
Start: 2021-03-06 — End: 2021-04-15

## 2021-03-06 NOTE — Patient Instructions (Signed)
1. Increase bupropion 300 mg daily  2. Continue Concerta 36 mg daily   3. Next appointment: 10/18 at 4:30

## 2021-03-17 DIAGNOSIS — F330 Major depressive disorder, recurrent, mild: Secondary | ICD-10-CM | POA: Diagnosis not present

## 2021-03-18 LAB — TSH: TSH: 2.28 u[IU]/mL (ref 0.450–4.500)

## 2021-03-25 ENCOUNTER — Ambulatory Visit (INDEPENDENT_AMBULATORY_CARE_PROVIDER_SITE_OTHER): Payer: Medicaid Other | Admitting: General Surgery

## 2021-03-25 ENCOUNTER — Encounter: Payer: Self-pay | Admitting: General Surgery

## 2021-03-25 ENCOUNTER — Other Ambulatory Visit: Payer: Self-pay

## 2021-03-25 VITALS — BP 84/58 | HR 87 | Temp 98.7°F | Resp 16 | Ht 61.0 in | Wt 191.0 lb

## 2021-03-25 DIAGNOSIS — K8020 Calculus of gallbladder without cholecystitis without obstruction: Secondary | ICD-10-CM

## 2021-03-25 NOTE — Progress Notes (Signed)
Felicia Martinez; [id number]; 2000/11/28   HPI Patient is a 20 year old female who was referred to my care by Dr. Tanya Nones for evaluation treatment of biliary colic secondary to cholelithiasis.  Patient was seen in the emergency room in late August with an episode of right upper quadrant abdominal pain, nausea, and vomiting.  She was diagnosed with cholelithiasis at that time.  Her liver enzyme tests were within normal limits.  Since that time, she had 1 more mild attack of biliary colic with nausea.  She currently feels fine.  She has been trying to avoid fatty foods.    Past Medical History:  Diagnosis Date   Allergy    Asthma    Morbid obesity (HCC)    Schizophrenia in children     History reviewed. No pertinent surgical history.  Family History  Problem Relation Age of Onset   Hypertension Mother    Anxiety disorder Mother    Depression Mother    Lung cancer Maternal Grandmother    Schizophrenia Maternal Aunt     Current Outpatient Medications on File Prior to Visit  Medication Sig Dispense Refill   acetaminophen (TYLENOL) 500 MG tablet Take 1,000 mg by mouth every 6 (six) hours as needed.     buPROPion (WELLBUTRIN XL) 300 MG 24 hr tablet Take 1 tablet (300 mg total) by mouth daily. 30 tablet 1   ondansetron (ZOFRAN) 4 MG tablet Take 1 tablet (4 mg total) by mouth every 8 (eight) hours as needed for nausea or vomiting. 20 tablet 0   HYDROcodone-acetaminophen (NORCO) 5-325 MG tablet Take 1 tablet by mouth every 6 (six) hours as needed for moderate pain. (Patient not taking: Reported on 03/25/2021) 20 tablet 0   methylphenidate (CONCERTA) 36 MG PO CR tablet Take 1 tablet (36 mg total) by mouth daily before breakfast. (Patient not taking: Reported on 03/25/2021) 30 tablet 0   No current facility-administered medications on file prior to visit.    Allergies  Allergen Reactions   Other     Seasonal Allergies-pollen    Social History   Substance and Sexual Activity  Alcohol  Use Yes   Comment: occasional    Social History   Tobacco Use  Smoking Status Never  Smokeless Tobacco Never    Review of Systems  Constitutional:  Positive for malaise/fatigue.  HENT: Negative.    Eyes: Negative.   Respiratory: Negative.    Cardiovascular: Negative.   Gastrointestinal:  Positive for abdominal pain, heartburn and nausea.  Musculoskeletal:  Positive for back pain, joint pain and neck pain.  Skin: Negative.   Neurological:  Positive for headaches.  Endo/Heme/Allergies: Negative.   Psychiatric/Behavioral: Negative.     Objective   Vitals:   03/25/21 1455  BP: (!) 84/58  Pulse: 87  Resp: 16  Temp: 98.7 F (37.1 C)  SpO2: 97%    Physical Exam Vitals reviewed.  Constitutional:      Appearance: Normal appearance. She is not ill-appearing.  HENT:     Head: Normocephalic and atraumatic.  Eyes:     General: No scleral icterus. Cardiovascular:     Rate and Rhythm: Normal rate and regular rhythm.     Heart sounds: Normal heart sounds. No murmur heard.   No friction rub. No gallop.  Pulmonary:     Effort: Pulmonary effort is normal. No respiratory distress.     Breath sounds: Normal breath sounds. No stridor. No wheezing, rhonchi or rales.  Abdominal:     General: Bowel sounds are normal.  There is no distension.     Palpations: Abdomen is soft. There is no mass.     Tenderness: There is no abdominal tenderness. There is no guarding or rebound.     Hernia: No hernia is present.  Skin:    General: Skin is warm and dry.  Neurological:     Mental Status: She is alert and oriented to person, place, and time.   Primary care notes reviewed Ultrasound shows cholelithiasis with a normal common bile duct. Assessment  Biliary colic secondary to cholelithiasis Plan  Patient is scheduled for laparoscopic cholecystectomy on 04/18/2021.  The risks and benefits of the procedure including bleeding, infection, hepatobiliary injury, and the possibility of an open  procedure were fully explained to the patient, who gave informed consent. 

## 2021-03-25 NOTE — H&P (Signed)
Felicia Martinez; [id number]; August 14, 2000   HPI Patient is a 20 year old female who was referred to my care by Dr. Tanya Nones for evaluation treatment of biliary colic secondary to cholelithiasis.  Patient was seen in the emergency room in late August with an episode of right upper quadrant abdominal pain, nausea, and vomiting.  She was diagnosed with cholelithiasis at that time.  Her liver enzyme tests were within normal limits.  Since that time, she had 1 more mild attack of biliary colic with nausea.  She currently feels fine.  She has been trying to avoid fatty foods.    Past Medical History:  Diagnosis Date   Allergy    Asthma    Morbid obesity (HCC)    Schizophrenia in children     History reviewed. No pertinent surgical history.  Family History  Problem Relation Age of Onset   Hypertension Mother    Anxiety disorder Mother    Depression Mother    Lung cancer Maternal Grandmother    Schizophrenia Maternal Aunt     Current Outpatient Medications on File Prior to Visit  Medication Sig Dispense Refill   acetaminophen (TYLENOL) 500 MG tablet Take 1,000 mg by mouth every 6 (six) hours as needed.     buPROPion (WELLBUTRIN XL) 300 MG 24 hr tablet Take 1 tablet (300 mg total) by mouth daily. 30 tablet 1   ondansetron (ZOFRAN) 4 MG tablet Take 1 tablet (4 mg total) by mouth every 8 (eight) hours as needed for nausea or vomiting. 20 tablet 0   HYDROcodone-acetaminophen (NORCO) 5-325 MG tablet Take 1 tablet by mouth every 6 (six) hours as needed for moderate pain. (Patient not taking: Reported on 03/25/2021) 20 tablet 0   methylphenidate (CONCERTA) 36 MG PO CR tablet Take 1 tablet (36 mg total) by mouth daily before breakfast. (Patient not taking: Reported on 03/25/2021) 30 tablet 0   No current facility-administered medications on file prior to visit.    Allergies  Allergen Reactions   Other     Seasonal Allergies-pollen    Social History   Substance and Sexual Activity  Alcohol  Use Yes   Comment: occasional    Social History   Tobacco Use  Smoking Status Never  Smokeless Tobacco Never    Review of Systems  Constitutional:  Positive for malaise/fatigue.  HENT: Negative.    Eyes: Negative.   Respiratory: Negative.    Cardiovascular: Negative.   Gastrointestinal:  Positive for abdominal pain, heartburn and nausea.  Musculoskeletal:  Positive for back pain, joint pain and neck pain.  Skin: Negative.   Neurological:  Positive for headaches.  Endo/Heme/Allergies: Negative.   Psychiatric/Behavioral: Negative.     Objective   Vitals:   03/25/21 1455  BP: (!) 84/58  Pulse: 87  Resp: 16  Temp: 98.7 F (37.1 C)  SpO2: 97%    Physical Exam Vitals reviewed.  Constitutional:      Appearance: Normal appearance. She is not ill-appearing.  HENT:     Head: Normocephalic and atraumatic.  Eyes:     General: No scleral icterus. Cardiovascular:     Rate and Rhythm: Normal rate and regular rhythm.     Heart sounds: Normal heart sounds. No murmur heard.   No friction rub. No gallop.  Pulmonary:     Effort: Pulmonary effort is normal. No respiratory distress.     Breath sounds: Normal breath sounds. No stridor. No wheezing, rhonchi or rales.  Abdominal:     General: Bowel sounds are normal.  There is no distension.     Palpations: Abdomen is soft. There is no mass.     Tenderness: There is no abdominal tenderness. There is no guarding or rebound.     Hernia: No hernia is present.  Skin:    General: Skin is warm and dry.  Neurological:     Mental Status: She is alert and oriented to person, place, and time.   Primary care notes reviewed Ultrasound shows cholelithiasis with a normal common bile duct. Assessment  Biliary colic secondary to cholelithiasis Plan  Patient is scheduled for laparoscopic cholecystectomy on 04/18/2021.  The risks and benefits of the procedure including bleeding, infection, hepatobiliary injury, and the possibility of an open  procedure were fully explained to the patient, who gave informed consent.

## 2021-03-30 ENCOUNTER — Encounter: Payer: Self-pay | Admitting: Psychiatry

## 2021-03-31 ENCOUNTER — Ambulatory Visit (INDEPENDENT_AMBULATORY_CARE_PROVIDER_SITE_OTHER): Payer: Medicaid Other | Admitting: Licensed Clinical Social Worker

## 2021-03-31 ENCOUNTER — Other Ambulatory Visit: Payer: Self-pay

## 2021-03-31 DIAGNOSIS — F331 Major depressive disorder, recurrent, moderate: Secondary | ICD-10-CM

## 2021-03-31 DIAGNOSIS — F1220 Cannabis dependence, uncomplicated: Secondary | ICD-10-CM | POA: Diagnosis not present
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
## 2021-03-31 NOTE — Plan of Care (Signed)
Developed Treatment plan w/ pt:   Problem: Decrease depressive symptoms and improve levels of effective functioning Goal: LTG: Reduce frequency, intensity, and duration of depression symptoms as evidenced by: SSB input needed on appropriate metric Outcome: Not Applicable Goal: STG: Zasha WILL PARTICIPATE IN AT LEAST 80% OF SCHEDULED INDIVIDUAL PSYCHOTHERAPY SESSIONS Outcome: Progressing Intervention: WORK WITH Raelan TO IDENTIFY THEIR 3 PERSONAL GOALS FOR MANAGING DEPRESSION SYMPTOMS AND ADD TO THIS PLAN Intervention: Encourage patient to identify triggers Intervention: Assess emotional status and coping mechanisms   Problem: Accept the fact of chemical dependence and begin to actively participate and utilize behavioral strategies and cognitive coping skills to help maintain sobriety.  Goal: LTG: Cinzia WILL INCREASE COPING SKILLS TO PROMOTE LONG-TERM RECOVERY AND IMPROVE ABILITY TO PERFORM DAILY ACTIVITIES Outcome: Progressing Goal: STG: Promote adherence to treatment regimen Outcome: Not Applicable Intervention: Educate patient on: Substance abuse Intervention: Review triggers associated with substance abuse

## 2021-04-10 NOTE — Progress Notes (Signed)
Virtual Visit via Video Note  I connected with Felicia Martinez on 04/15/21 at  4:30 PM EDT by a video enabled telemedicine application and verified that I am speaking with the correct person using two identifiers.  Location: Patient: home Provider: office Persons participated in the visit- patient, provider    I discussed the limitations of evaluation and management by telemedicine and the availability of in person appointments. The patient expressed understanding and agreed to proceed.     I discussed the assessment and treatment plan with the patient. The patient was provided an opportunity to ask questions and all were answered. The patient agreed with the plan and demonstrated an understanding of the instructions.   The patient was advised to call back or seek an in-person evaluation if the symptoms worsen or if the condition fails to improve as anticipated.  I provided 16 minutes of non-face-to-face time during this encounter.   Neysa Hotter, MD    [facility] MD/PA/NP OP Progress Note  04/15/2021 5:02 PM Felicia Martinez  MRN:  [id number]  Chief Complaint:  Chief Complaint   Follow-up; Depression    HPI:  This is a follow-up appointment for depression and ADHD.  She states that she has been feeling stressed due to the upcoming surgery.  Her mood has been better since up titration of bupropion.  She feels there is a boost.  She does not feel paralyzed as much compared to before.  She enjoys playing video games.  She broke up with her boyfriend as the relationship was not good for her.  She will have a date in a few days.  Although she cannot keep up with one of her classes as she was not aware of the exam, she has been doing well otherwise.  She has not taken Concerta as much as she notices that she feels more anxious after taking the medication.  She also finds the higher dose of bupropion to be helpful for her concentration.  Although she still has difficulty in doing multi  tasks, she has been able to complete tasks and has enough focus.  She agrees to hold the Concerta at this time.  Her sleep has been better.  Although she did leave a voice message to the clinic for sleep study, she has not heard back from them.  She feels less fatigued.  She denies change in weight or appetite.  She denies SI.  She is willing to try higher dose of bupropion.   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 Hope to become a Runner, broadcasting/film/video. Took honors in high school She grew up in [place], "she describes her childhood as "nice childhood though poor." Her parents [place] from [place] and separated. She has good relationship with both of her parents  Visit Diagnosis:    ICD-10-CM   1. MDD (major depressive disorder), recurrent episode, mild (HCC)  F33.0       Past Psychiatric History: Please see initial evaluation for full details. I have reviewed the history. No updates at this time.     Past Medical History:  Past Medical History:  Diagnosis Date   Allergy    Asthma    Morbid obesity (HCC)    Schizophrenia in children  No past surgical history on file.  Family Psychiatric History: Please see initial evaluation for full details. I have reviewed the history. No updates at this time.     Family History:  Family History  Problem Relation Age of Onset   Hypertension Mother    Anxiety disorder Mother    Depression Mother    Lung cancer Maternal Grandmother    Schizophrenia Maternal Aunt     Social History:  Social History   Socioeconomic History   Marital status: Single    Spouse name: Not on file   Number of children: Not on file   Years of education: Not on file   Highest  education level: Not on file  Occupational History   Not on file  Tobacco Use   Smoking status: Never   Smokeless tobacco: Never  Vaping Use   Vaping Use: Never used  Substance and Sexual Activity   Alcohol use: Yes    Comment: occasional   Drug use: Yes    Types: Marijuana   Sexual activity: Yes    Birth control/protection: Condom    Comment: menarch age 25.  Lives with mom.  Parents separated.  Other Topics Concern   Not on file  Social History Narrative   In 9th grade at [employer]. She is performing good in school.  No regular exercise.  More than 3 hours of screen time per day.  Enjoys watching netflix, writing poetry and talking to friends.    Social Determinants of Health   Financial Resource Strain: Not on file  Food Insecurity: Not on file  Transportation Needs: Not on file  Physical Activity: Not on file  Stress: Not on file  Social Connections: Not on file    Allergies:  Allergies  Allergen Reactions   Other     Seasonal Allergies-pollen    Metabolic Disorder Labs: Lab Results  Component Value Date   HGBA1C 5.4 08/06/2020   MPG 108 08/06/2020   MPG 114 10/18/2019   Lab Results  Component Value Date   PROLACTIN 8.8 01/17/2015   Lab Results  Component Value Date   CHOL 169 10/18/2019   TRIG 153 (H) 10/18/2019   HDL 44 (L) 10/18/2019   CHOLHDL 3.8 10/18/2019   VLDL 35 (H) 08/02/2015   LDLCALC 100 10/18/2019   LDLCALC 110 (H) 08/02/2015   Lab Results  Component Value Date   TSH 2.280 03/17/2021   TSH 1.30 03/07/2020    Therapeutic Level Labs: No results found for: LITHIUM No results found for: VALPROATE No components found for:  CBMZ  Current Medications: Current Outpatient Medications  Medication Sig Dispense Refill   buPROPion (WELLBUTRIN XL) 150 MG 24 hr tablet Take 3 tablets (450 mg total) by mouth daily. 90 tablet 2   buPROPion (WELLBUTRIN XL) 300 MG 24 hr tablet Take 1 tablet (300 mg total) by mouth daily. 30 tablet 1    HYDROcodone-acetaminophen (NORCO) 5-325 MG tablet Take 1 tablet by mouth every 6 (six) hours as needed for moderate pain. (Patient not taking: No sig reported) 20 tablet 0   ondansetron (ZOFRAN) 4 MG tablet Take 1 tablet (4 mg total) by mouth every 8 (eight) hours as needed for nausea or vomiting. (Patient not taking: Reported on 04/11/2021) 20 tablet 0   No current facility-administered medications for this visit.     Musculoskeletal: Strength & Muscle Tone:  N/A Gait & Station:  N/A Patient leans: N/A  Psychiatric Specialty Exam: Review of Systems  Psychiatric/Behavioral:  Positive for decreased concentration, dysphoric mood and sleep disturbance. Negative for agitation, behavioral problems, confusion, hallucinations, self-injury and suicidal ideas. The patient is not nervous/anxious and is not hyperactive.   All other systems reviewed and are negative.  There were no vitals taken for this visit.There is no height or weight on file to calculate BMI.  General Appearance: Fairly Groomed  Eye Contact:  Good  Speech:  Clear and Coherent  Volume:  Normal  Mood:   better  Affect:  Appropriate, Congruent, and less fatigue  Thought Process:  Coherent  Orientation:  Full (Time, Place, and Person)  Thought Content: Logical   Suicidal Thoughts:  No  Homicidal Thoughts:  No  Memory:  Immediate;   Good  Judgement:  Good  Insight:  Good  Psychomotor Activity:  Normal  Concentration:  Concentration: Good and Attention Span: Good  Recall:  Good  Fund of Knowledge: Good  Language: Good  Akathisia:  No  Handed:  Right  AIMS (if indicated): not done  Assets:  Communication Skills Desire for Improvement  ADL's:  Intact  Cognition: WNL  Sleep:  Fair   Screenings: GAD-7    Flowsheet Row Office Visit from 12/21/2019 in [facility]  Total GAD-7 Score 5      PHQ2-9    Flowsheet Row Video Visit from 04/15/2021 in [facility] Counselor from  03/31/2021 in [facility] Video Visit from 03/06/2021 in [facility] Video Visit from 10/08/2020 in [facility] Video Visit from 09/10/2020 in [facility]  PHQ-2 Total Score 2 2 2  0 1  PHQ-9 Total Score 6 10 10  -- --      Flowsheet Row Video Visit from 04/15/2021 in [facility] Counselor from 03/31/2021 in [facility] Video Visit from 03/06/2021 in [facility]  C-SSRS RISK CATEGORY No Risk No Risk No Risk        Assessment and Plan:  Felicia Martinez is a 20 y.o. year old female with a history of depression, who presents for follow up appointment for below.    1. MDD (major depressive disorder), recurrent episode, mild (HCC) There has been overall improvement in depressive symptoms since up titration of bupropion.  We do further up titration of bupropion to optimize treatment for depression.  Discussed potential risk of headache.  She has no known history of seizure.  She will greatly benefit from CBT; she will continue to see a therapist.   2. Attention deficit hyperactivity disorder (ADHD), unspecified ADHD type Improving since up titration of bupropion.  She already takes Concerta now given adverse reaction of anxiety.  Will hold Concerta at this time.   3. Insomnia, unspecified type Improving.  She has not been able to setting up an appointment for a sleep study despite her trying.  She is advised to contact the clinic again after her surgery.   This clinician has discussed the side effect associated with medication prescribed during this encounter. Please refer to notes in the previous encounters for more details.     Plan 1. Increase bupropion 450 mg daily  2. Discontinue Concerta 3. Next appointment: 1/5 at 4:30, video   This clinician has discussed the side effect associated with  medication prescribed during this encounter. Please refer to notes in the previous encounters for more details.     Past trials of medication: Abilify (weight gain) Trazodone, melatonin (felt sad)   The patient  demonstrates the following risk factors for suicide: Chronic risk factors for suicide include: psychiatric disorder of depression. Acute risk factors for suicide include: N/A. Protective factors for this patient include: positive social support, coping skills and hope for the future. Considering these factors, the overall suicide risk at this point appears to be low. Patient is appropriate for outpatient follow up    Neysa Hotter, MD 04/15/2021, 5:02 PM

## 2021-04-14 NOTE — Patient Instructions (Signed)
Your procedure is scheduled on: 04/21/2021  Report to [facility] Short Stay at   11:30  AM.  Call this number if you have problems the morning of surgery: [phone]   Remember:   Do not Eat or Drink after midnight         No Smoking the morning of surgery  :  Take these medicines the morning of surgery with A SIP OF WATER: Bupropion   Do not wear jewelry, make-up or nail polish.  Do not wear lotions, powders, or perfumes. You may wear deodorant.  Do not shave 48 hours prior to surgery. Men may shave face and neck.  Do not bring valuables to the hospital.  Contacts, dentures or bridgework may not be worn into surgery.  Leave suitcase in the car. After surgery it may be brought to your room.  For patients admitted to the hospital, checkout time is 11:00 AM the day of discharge.   Patients discharged the day of surgery will not be allowed to drive home.    Special Instructions: Shower using CHG night before surgery and shower the day of surgery use CHG.  Use special wash - you have one bottle of CHG for all showers.  You should use approximately 1/2 of the bottle for each shower.  How to Use Chlorhexidine for Bathing Chlorhexidine gluconate (CHG) is a germ-killing (antiseptic) solution that is used to clean the skin. It can get rid of the bacteria that normally live on the skin and can keep them away for about 24 hours. To clean your skin with CHG, you may be given: A CHG solution to use in the shower or as part of a sponge bath. A prepackaged cloth that contains CHG. Cleaning your skin with CHG may help lower the risk for infection: While you are staying in the intensive care unit of the hospital. If you have a vascular access, such as a central line, to provide short-term or long-term access to your veins. If you have a catheter to drain urine from your bladder. If you are on a ventilator. A ventilator is a machine that helps you breathe by moving air in and out of your  lungs. After surgery. What are the risks? Risks of using CHG include: A skin reaction. Hearing loss, if CHG gets in your ears and you have a perforated eardrum. Eye injury, if CHG gets in your eyes and is not rinsed out. The CHG product catching fire. Make sure that you avoid smoking and flames after applying CHG to your skin. Do not use CHG: If you have a chlorhexidine allergy or have previously reacted to chlorhexidine. On babies younger than 62 months of age. How to use CHG solution Use CHG only as told by your health care provider, and follow the instructions on the label. Use the full amount of CHG as directed. Usually, this is one bottle. During a shower Follow these steps when using CHG solution during a shower (unless your health care provider gives you different instructions): Start the shower. Use your normal soap and shampoo to wash your face and hair. Turn off the shower or move out of the shower stream. Pour the CHG onto a clean washcloth. Do not use any type of brush or rough-edged sponge. Starting at your neck, lather your body down to your toes. Make sure you follow these instructions: If you will be having surgery, pay special attention to the part of your body where you will be having surgery. Scrub  this area for at least 1 minute. Do not use CHG on your head or face. If the solution gets into your ears or eyes, rinse them well with water. Avoid your genital area. Avoid any areas of skin that have broken skin, cuts, or scrapes. Scrub your back and under your arms. Make sure to wash skin folds. Let the lather sit on your skin for 1-2 minutes or as long as told by your health care provider. Thoroughly rinse your entire body in the shower. Make sure that all body creases and crevices are rinsed well. Dry off with a clean towel. Do not put any substances on your body afterward--such as powder, lotion, or perfume--unless you are told to do so by your health care provider.  Only use lotions that are recommended by the manufacturer. Put on clean clothes or pajamas. If it is the night before your surgery, sleep in clean sheets.  During a sponge bath Follow these steps when using CHG solution during a sponge bath (unless your health care provider gives you different instructions): Use your normal soap and shampoo to wash your face and hair. Pour the CHG onto a clean washcloth. Starting at your neck, lather your body down to your toes. Make sure you follow these instructions: If you will be having surgery, pay special attention to the part of your body where you will be having surgery. Scrub this area for at least 1 minute. Do not use CHG on your head or face. If the solution gets into your ears or eyes, rinse them well with water. Avoid your genital area. Avoid any areas of skin that have broken skin, cuts, or scrapes. Scrub your back and under your arms. Make sure to wash skin folds. Let the lather sit on your skin for 1-2 minutes or as long as told by your health care provider. Using a different clean, wet washcloth, thoroughly rinse your entire body. Make sure that all body creases and crevices are rinsed well. Dry off with a clean towel. Do not put any substances on your body afterward--such as powder, lotion, or perfume--unless you are told to do so by your health care provider. Only use lotions that are recommended by the manufacturer. Put on clean clothes or pajamas. If it is the night before your surgery, sleep in clean sheets. How to use CHG prepackaged cloths Only use CHG cloths as told by your health care provider, and follow the instructions on the label. Use the CHG cloth on clean, dry skin. Do not use the CHG cloth on your head or face unless your health care provider tells you to. When washing with the CHG cloth: Avoid your genital area. Avoid any areas of skin that have broken skin, cuts, or scrapes. Before surgery Follow these steps when using a  CHG cloth to clean before surgery (unless your health care provider gives you different instructions): Using the CHG cloth, vigorously scrub the part of your body where you will be having surgery. Scrub using a back-and-forth motion for 3 minutes. The area on your body should be completely wet with CHG when you are done scrubbing. Do not rinse. Discard the cloth and let the area air-dry. Do not put any substances on the area afterward, such as powder, lotion, or perfume. Put on clean clothes or pajamas. If it is the night before your surgery, sleep in clean sheets.  For general bathing Follow these steps when using CHG cloths for general bathing (unless your health care provider  gives you different instructions). Use a separate CHG cloth for each area of your body. Make sure you wash between any folds of skin and between your fingers and toes. Wash your body in the following order, switching to a new cloth after each step: The front of your neck, shoulders, and chest. Both of your arms, under your arms, and your hands. Your stomach and groin area, avoiding the genitals. Your right leg and foot. Your left leg and foot. The back of your neck, your back, and your buttocks. Do not rinse. Discard the cloth and let the area air-dry. Do not put any substances on your body afterward--such as powder, lotion, or perfume--unless you are told to do so by your health care provider. Only use lotions that are recommended by the manufacturer. Put on clean clothes or pajamas. Contact a health care provider if: Your skin gets irritated after scrubbing. You have questions about using your solution or cloth. You swallow any chlorhexidine. Call your local poison control center [phone] in the [place] Get help right away if: Your eyes itch badly, or they become very red or swollen. Your skin itches badly and is red or swollen. Your hearing changes. You have trouble seeing. You have swelling or tingling in  your mouth or throat. You have trouble breathing. These symptoms may represent a serious problem that is an emergency. Do not wait to see if the symptoms will go away. Get medical help right away. Call your local emergency services (911 in the [place] Do not drive yourself to the hospital. Summary Chlorhexidine gluconate (CHG) is a germ-killing (antiseptic) solution that is used to clean the skin. Cleaning your skin with CHG may help to lower your risk for infection. You may be given CHG to use for bathing. It may be in a bottle or in a prepackaged cloth to use on your skin. Carefully follow your health care provider's instructions and the instructions on the product label. Do not use CHG if you have a chlorhexidine allergy. Contact your health care provider if your skin gets irritated after scrubbing. This information is not intended to replace advice given to you by your health care provider. Make sure you discuss any questions you have with your health care provider. Document Revised: 08/26/2020 Document Reviewed: 08/26/2020 Elsevier Patient Education  2022 [employer] Minimally Invasive Cholecystectomy, Care After This sheet gives you information about how to care for yourself after your procedure. Your doctor may also give you more specific instructions. If you have problems or questions, contact your doctor. What can I expect after the procedure? After the procedure, it is common: To have pain at the areas of surgery. You will be given medicines for pain. To vomit or feel like you may vomit. To feel fullness in the belly (bloating) or to have pain in the shoulder. This comes from the gas that was used during the surgery. Follow these instructions at home: Medicines Take over-the-counter and prescription medicines only as told by your doctor. If you were prescribed an antibiotic medicine, take it as told by your doctor. Do not stop using the antibiotic even if you start to feel  better. Ask your doctor if the medicine prescribed to you: Requires you to avoid driving or using machinery. Can cause trouble pooping (constipation). You may need to take these actions to prevent or treat trouble pooping: Drink enough fluid to keep your pee (urine) pale yellow. Take over-the-counter or prescription medicines. Eat foods that are high in fiber. These  include beans, whole grains, and fresh fruits and vegetables. Limit foods that are high in fat and sugar. These include fried or sweet foods. Incision care  Follow instructions from your doctor about how to take care of your cuts from surgery (incisions). Make sure you: Wash your hands with soap and water for at least 20 seconds before and after you change your bandage (dressing). If you cannot use soap and water, use hand sanitizer. Change your bandage as told by your doctor. Leave stitches (sutures), skin glue, or skin tape (adhesive) strips in place. They may need to stay in place for 2 weeks or longer. If tape strips get loose and curl up, you may trim the loose edges. Do not remove tape strips completely unless your doctor says it is okay. Do not take baths, swim, or use a hot tub until your doctor approves. Ask your doctor if you may take showers. You may only be allowed to take sponge baths. Check your surgery area every day for signs of infection. Check for: More redness, swelling, or pain. Fluid or blood. Warmth. Pus or a bad smell. Activity Rest as told by your doctor. Do not sit for a long time without moving. Get up to take short walks every 1-2 hours. This is important. Ask for help if you feel weak or unsteady. Do not lift anything that is heavier than 10 lb (4.5 kg), or the limit that you are told, until your doctor says that it is safe. Do not play contact sports until your doctor says it is okay. Do not return to work or school until your doctor says it is okay. Return to your normal activities as told by your  doctor. Ask your doctor what activities are safe for you. General instructions If you were given a medicine to help you relax (sedative) during your procedure, it can affect you for many hours. Do not drive or use machinery until your doctor says that it is safe. Keep all follow-up visits as told by your doctor. This is important. Contact a doctor if: You get a rash. You have more redness, swelling, or pain around your cuts from surgery. You have fluid or blood coming from your cuts from surgery. Your cuts from surgery feel warm to the touch. You have pus or a bad smell coming from your cuts from surgery. You have a fever. One or more of your cuts from surgery breaks open. Get help right away if: You have trouble breathing. You have chest pain. You have pain that is getting worse in your shoulders. You faint or feel dizzy when you stand. You have very bad pain in your belly (abdomen). You feel like you may vomit or you vomit, and this lasts for more than one day. You have leg pain. Summary After your surgery, it is common to have pain at the areas of surgery. You may also have vomiting or fullness in the belly. Follow your doctor's instructions about medicine, activity restrictions, and caring for your surgery areas. Do not do activities that require a lot of effort. Contact a doctor if you have a fever or other signs of infection, such as more redness, swelling, or pain around the cuts from surgery. Get help right away if you have chest pain, increasing pain in the shoulders, or trouble breathing. This information is not intended to replace advice given to you by your health care provider. Make sure you discuss any questions you have with your health care provider. Document  Revised: 05/30/2019 Document Reviewed: 05/30/2019 Elsevier Patient Education  2022 [employer] Inc. General Anesthesia, Adult, Care After This sheet gives you information about how to care for yourself after your  procedure. Your health care provider may also give you more specific instructions. If you have problems or questions, contact your health care provider. What can I expect after the procedure? After the procedure, the following side effects are common: Pain or discomfort at the IV site. Nausea. Vomiting. Sore throat. Trouble concentrating. Feeling cold or chills. Feeling weak or tired. Sleepiness and fatigue. Soreness and body aches. These side effects can affect parts of the body that were not involved in surgery. Follow these instructions at home: For the time period you were told by your health care provider:  Rest. Do not participate in activities where you could fall or become injured. Do not drive or use machinery. Do not drink alcohol. Do not take sleeping pills or medicines that cause drowsiness. Do not make important decisions or sign legal documents. Do not take care of children on your own. Eating and drinking Follow any instructions from your health care provider about eating or drinking restrictions. When you feel hungry, start by eating small amounts of foods that are soft and easy to digest (bland), such as toast. Gradually return to your regular diet. Drink enough fluid to keep your urine pale yellow. If you vomit, rehydrate by drinking water, juice, or clear broth. General instructions If you have sleep apnea, surgery and certain medicines can increase your risk for breathing problems. Follow instructions from your health care provider about wearing your sleep device: Anytime you are sleeping, including during daytime naps. While taking prescription pain medicines, sleeping medicines, or medicines that make you drowsy. Have a responsible adult stay with you for the time you are told. It is important to have someone help care for you until you are awake and alert. Return to your normal activities as told by your health care provider. Ask your health care provider what  activities are safe for you. Take over-the-counter and prescription medicines only as told by your health care provider. If you smoke, do not smoke without supervision. Keep all follow-up visits as told by your health care provider. This is important. Contact a health care provider if: You have nausea or vomiting that does not get better with medicine. You cannot eat or drink without vomiting. You have pain that does not get better with medicine. You are unable to pass urine. You develop a skin rash. You have a fever. You have redness around your IV site that gets worse. Get help right away if: You have difficulty breathing. You have chest pain. You have blood in your urine or stool, or you vomit blood. Summary After the procedure, it is common to have a sore throat or nausea. It is also common to feel tired. Have a responsible adult stay with you for the time you are told. It is important to have someone help care for you until you are awake and alert. When you feel hungry, start by eating small amounts of foods that are soft and easy to digest (bland), such as toast. Gradually return to your regular diet. Drink enough fluid to keep your urine pale yellow. Return to your normal activities as told by your health care provider. Ask your health care provider what activities are safe for you. This information is not intended to replace advice given to you by your health care provider. Make sure you discuss  any questions you have with your health care provider. Document Revised: 02/29/2020 Document Reviewed: 09/28/2019 Elsevier Patient Education  2022 [employer].

## 2021-04-15 ENCOUNTER — Encounter (HOSPITAL_COMMUNITY)
Admission: RE | Admit: 2021-04-15 | Discharge: 2021-04-15 | Disposition: A | Discharge status: Home / Self Care | Payer: Medicaid Other | Source: Ambulatory Visit | Attending: General Surgery | Admitting: General Surgery

## 2021-04-15 ENCOUNTER — Encounter (HOSPITAL_COMMUNITY): Payer: Self-pay

## 2021-04-15 ENCOUNTER — Other Ambulatory Visit: Payer: Self-pay

## 2021-04-15 ENCOUNTER — Encounter: Payer: Self-pay | Admitting: Psychiatry

## 2021-04-15 ENCOUNTER — Telehealth (INDEPENDENT_AMBULATORY_CARE_PROVIDER_SITE_OTHER): Payer: Medicaid Other | Admitting: Psychiatry

## 2021-04-15 DIAGNOSIS — F330 Major depressive disorder, recurrent, mild: Secondary | ICD-10-CM

## 2021-04-15 DIAGNOSIS — Z01812 Encounter for preprocedural laboratory examination: Secondary | ICD-10-CM | POA: Diagnosis not present

## 2021-04-15 LAB — PREGNANCY, URINE: Preg Test, Ur: NEGATIVE

## 2021-04-15 MED ORDER — BUPROPION HCL ER (XL) 150 MG PO TB24: 450.0000 mg | ORAL_TABLET | Freq: Every day | ORAL | 2 refills | Status: DC

## 2021-04-15 NOTE — Patient Instructions (Signed)
1. Increase bupropion 450 mg daily  2. Discontinue Concerta 3. Next appointment: 1/5 at 4:30

## 2021-04-21 ENCOUNTER — Encounter (HOSPITAL_COMMUNITY)
Admission: RE | Disposition: A | Discharge status: Home / Self Care | Payer: Self-pay | Source: Home / Self Care | Attending: General Surgery

## 2021-04-21 ENCOUNTER — Ambulatory Visit (HOSPITAL_COMMUNITY): Payer: Medicaid Other | Admitting: Anesthesiology

## 2021-04-21 ENCOUNTER — Other Ambulatory Visit: Payer: Self-pay

## 2021-04-21 ENCOUNTER — Ambulatory Visit (HOSPITAL_COMMUNITY)
Admission: RE | Admit: 2021-04-21 | Discharge: 2021-04-21 | Disposition: A | Discharge status: Home / Self Care | Payer: Medicaid Other | Attending: General Surgery | Admitting: General Surgery

## 2021-04-21 ENCOUNTER — Encounter (HOSPITAL_COMMUNITY): Payer: Self-pay | Admitting: General Surgery

## 2021-04-21 DIAGNOSIS — K8064 Calculus of gallbladder and bile duct with chronic cholecystitis without obstruction: Secondary | ICD-10-CM | POA: Insufficient documentation

## 2021-04-21 DIAGNOSIS — Z79899 Other long term (current) drug therapy: Secondary | ICD-10-CM | POA: Insufficient documentation

## 2021-04-21 DIAGNOSIS — K8020 Calculus of gallbladder without cholecystitis without obstruction: Secondary | ICD-10-CM | POA: Diagnosis not present

## 2021-04-21 DIAGNOSIS — K8010 Calculus of gallbladder with chronic cholecystitis without obstruction: Secondary | ICD-10-CM | POA: Diagnosis not present

## 2021-04-21 DIAGNOSIS — K8070 Calculus of gallbladder and bile duct without cholecystitis without obstruction: Secondary | ICD-10-CM | POA: Diagnosis not present

## 2021-04-21 HISTORY — PX: CHOLECYSTECTOMY: SHX55

## 2021-04-21 SURGERY — LAPAROSCOPIC CHOLECYSTECTOMY
Anesthesia: General | Site: Abdomen

## 2021-04-21 MED ORDER — PROPOFOL 10 MG/ML IV BOLUS
INTRAVENOUS | Status: AC
  Filled 2021-04-21: qty 20

## 2021-04-21 MED ORDER — SODIUM CHLORIDE 0.9 % IR SOLN
Status: DC | PRN
  Administered 2021-04-21: 1000 mL

## 2021-04-21 MED ORDER — SCOPOLAMINE 1 MG/3DAYS TD PT72
MEDICATED_PATCH | TRANSDERMAL | Status: AC
  Administered 2021-04-21: 1.5 mg
  Filled 2021-04-21: qty 1

## 2021-04-21 MED ORDER — ORAL CARE MOUTH RINSE: 15.0000 mL | Freq: Once | OROMUCOSAL | Status: AC

## 2021-04-21 MED ORDER — HYDROCODONE-ACETAMINOPHEN 5-325 MG PO TABS: 1.0000 | ORAL_TABLET | Freq: Four times a day (QID) | ORAL | 0 refills | Status: DC | PRN

## 2021-04-21 MED ORDER — CHLORHEXIDINE GLUCONATE CLOTH 2 % EX PADS: 6.0000 | MEDICATED_PAD | Freq: Once | CUTANEOUS | Status: DC

## 2021-04-21 MED ORDER — LIDOCAINE 2% (20 MG/ML) 5 ML SYRINGE
INTRAMUSCULAR | Status: DC | PRN
  Administered 2021-04-21: 100 mg via INTRAVENOUS

## 2021-04-21 MED ORDER — SUGAMMADEX SODIUM 200 MG/2ML IV SOLN
INTRAVENOUS | Status: DC | PRN
  Administered 2021-04-21: 400 mg via INTRAVENOUS

## 2021-04-21 MED ORDER — CEFAZOLIN SODIUM-DEXTROSE 2-4 GM/100ML-% IV SOLN
INTRAVENOUS | Status: AC
  Filled 2021-04-21: qty 100

## 2021-04-21 MED ORDER — DEXMEDETOMIDINE (PRECEDEX) IN NS 20 MCG/5ML (4 MCG/ML) IV SYRINGE
PREFILLED_SYRINGE | INTRAVENOUS | Status: AC
  Filled 2021-04-21: qty 5

## 2021-04-21 MED ORDER — DEXAMETHASONE SODIUM PHOSPHATE 4 MG/ML IJ SOLN
INTRAMUSCULAR | Status: DC | PRN
  Administered 2021-04-21: 8 mg via INTRAVENOUS

## 2021-04-21 MED ORDER — FENTANYL CITRATE (PF) 250 MCG/5ML IJ SOLN
INTRAMUSCULAR | Status: AC
  Filled 2021-04-21: qty 5

## 2021-04-21 MED ORDER — HYDROMORPHONE HCL 1 MG/ML IJ SOLN
0.2500 mg | INTRAMUSCULAR | Status: DC | PRN
  Administered 2021-04-21: 0.5 mg via INTRAVENOUS

## 2021-04-21 MED ORDER — MIDAZOLAM HCL 2 MG/2ML IJ SOLN
INTRAMUSCULAR | Status: AC
  Filled 2021-04-21: qty 2

## 2021-04-21 MED ORDER — PROPOFOL 10 MG/ML IV BOLUS
INTRAVENOUS | Status: DC | PRN
  Administered 2021-04-21: 200 mg via INTRAVENOUS

## 2021-04-21 MED ORDER — ROCURONIUM BROMIDE 100 MG/10ML IV SOLN
INTRAVENOUS | Status: DC | PRN
  Administered 2021-04-21: 50 mg via INTRAVENOUS

## 2021-04-21 MED ORDER — ONDANSETRON HCL 4 MG/2ML IJ SOLN
4.0000 mg | Freq: Once | INTRAMUSCULAR | Status: AC | PRN
  Administered 2021-04-21: 4 mg via INTRAVENOUS

## 2021-04-21 MED ORDER — BUPIVACAINE LIPOSOME 1.3 % IJ SUSP
INTRAMUSCULAR | Status: AC
  Filled 2021-04-21: qty 20

## 2021-04-21 MED ORDER — CEFAZOLIN SODIUM-DEXTROSE 2-4 GM/100ML-% IV SOLN
2.0000 g | INTRAVENOUS | Status: AC
  Administered 2021-04-21: 2 g via INTRAVENOUS

## 2021-04-21 MED ORDER — HEMOSTATIC AGENTS (NO CHARGE) OPTIME
TOPICAL | Status: DC | PRN
  Administered 2021-04-21: 1 via TOPICAL

## 2021-04-21 MED ORDER — SUCCINYLCHOLINE CHLORIDE 200 MG/10ML IV SOSY
PREFILLED_SYRINGE | INTRAVENOUS | Status: DC | PRN
  Administered 2021-04-21: 140 mg via INTRAVENOUS

## 2021-04-21 MED ORDER — MEPERIDINE HCL 50 MG/ML IJ SOLN: 6.2500 mg | INTRAMUSCULAR | Status: DC | PRN

## 2021-04-21 MED ORDER — CHLORHEXIDINE GLUCONATE 0.12 % MT SOLN
OROMUCOSAL | Status: AC
  Filled 2021-04-21: qty 15

## 2021-04-21 MED ORDER — MIDAZOLAM HCL 5 MG/5ML IJ SOLN
INTRAMUSCULAR | Status: DC | PRN
Start: 2021-04-21 — End: 2021-04-21
  Administered 2021-04-21: 2 mg via INTRAVENOUS

## 2021-04-21 MED ORDER — CHLORHEXIDINE GLUCONATE 0.12 % MT SOLN
15.0000 mL | Freq: Once | OROMUCOSAL | Status: AC
  Administered 2021-04-21: 15 mL via OROMUCOSAL

## 2021-04-21 MED ORDER — LACTATED RINGERS IV SOLN: INTRAVENOUS | Status: DC

## 2021-04-21 MED ORDER — DEXAMETHASONE SODIUM PHOSPHATE 10 MG/ML IJ SOLN
INTRAMUSCULAR | Status: AC
  Filled 2021-04-21: qty 1

## 2021-04-21 MED ORDER — BUPIVACAINE LIPOSOME 1.3 % IJ SUSP
INTRAMUSCULAR | Status: DC | PRN
  Administered 2021-04-21: 20 mL

## 2021-04-21 MED ORDER — KETOROLAC TROMETHAMINE 30 MG/ML IJ SOLN
30.0000 mg | Freq: Once | INTRAMUSCULAR | Status: AC
  Administered 2021-04-21: 30 mg via INTRAVENOUS
  Filled 2021-04-21: qty 1

## 2021-04-21 MED ORDER — PROMETHAZINE HCL 25 MG/ML IJ SOLN
6.2500 mg | INTRAMUSCULAR | Status: AC | PRN
  Administered 2021-04-21 (×2): 6.25 mg via INTRAVENOUS

## 2021-04-21 MED ORDER — DEXMEDETOMIDINE (PRECEDEX) IN NS 20 MCG/5ML (4 MCG/ML) IV SYRINGE
PREFILLED_SYRINGE | INTRAVENOUS | Status: DC | PRN
  Administered 2021-04-21: 20 ug via INTRAVENOUS

## 2021-04-21 MED ORDER — FENTANYL CITRATE (PF) 100 MCG/2ML IJ SOLN
INTRAMUSCULAR | Status: DC | PRN
  Administered 2021-04-21 (×2): 50 ug via INTRAVENOUS
  Administered 2021-04-21: 100 ug via INTRAVENOUS
  Administered 2021-04-21: 50 ug via INTRAVENOUS

## 2021-04-21 MED ORDER — ONDANSETRON HCL 4 MG/2ML IJ SOLN
INTRAMUSCULAR | Status: DC | PRN
  Administered 2021-04-21: 4 mg via INTRAVENOUS

## 2021-04-21 SURGICAL SUPPLY — 43 items
ADH SKN CLS APL DERMABOND .7 (GAUZE/BANDAGES/DRESSINGS) ×1
APL PRP STRL LF DISP 70% ISPRP (MISCELLANEOUS) ×1
APL SRG 38 LTWT LNG FL B (MISCELLANEOUS)
APPLICATOR ARISTA FLEXITIP XL (MISCELLANEOUS) IMPLANT
APPLIER CLIP ROT 10 11.4 M/L (STAPLE) ×2
APR CLP MED LRG 11.4X10 (STAPLE) ×1
BAG RETRIEVAL 10 (BASKET) ×1
CHLORAPREP W/TINT 26 (MISCELLANEOUS) ×2 IMPLANT
CLIP APPLIE ROT 10 11.4 M/L (STAPLE) ×1 IMPLANT
CLOTH BEACON ORANGE TIMEOUT ST (SAFETY) ×2 IMPLANT
COVER LIGHT HANDLE STERIS (MISCELLANEOUS) ×4 IMPLANT
DERMABOND ADVANCED (GAUZE/BANDAGES/DRESSINGS) ×1
DERMABOND ADVANCED .7 DNX12 (GAUZE/BANDAGES/DRESSINGS) ×1 IMPLANT
ELECT REM PT RETURN 9FT ADLT (ELECTROSURGICAL) ×2
ELECTRODE REM PT RTRN 9FT ADLT (ELECTROSURGICAL) ×1 IMPLANT
GAUZE 4X4 16PLY ~~LOC~~+RFID DBL (SPONGE) ×2 IMPLANT
GLOVE SURG POLYISO LF SZ7.5 (GLOVE) ×2 IMPLANT
GLOVE SURG UNDER POLY LF SZ7 (GLOVE) ×4 IMPLANT
GOWN STRL REUS W/TWL LRG LVL3 (GOWN DISPOSABLE) ×6 IMPLANT
HEMOSTAT ARISTA ABSORB 3G PWDR (HEMOSTASIS) IMPLANT
HEMOSTAT SNOW SURGICEL 2X4 (HEMOSTASIS) ×2 IMPLANT
INST SET LAPROSCOPIC AP (KITS) ×2 IMPLANT
KIT TURNOVER KIT A (KITS) ×2 IMPLANT
MANIFOLD NEPTUNE II (INSTRUMENTS) ×2 IMPLANT
NEEDLE HYPO 18GX1.5 BLUNT FILL (NEEDLE) ×2 IMPLANT
NEEDLE HYPO 21X1.5 SAFETY (NEEDLE) ×2 IMPLANT
NEEDLE INSUFFLATION 14GA 120MM (NEEDLE) ×2 IMPLANT
NS IRRIG 1000ML POUR BTL (IV SOLUTION) ×2 IMPLANT
PACK LAP CHOLE LZT030E (CUSTOM PROCEDURE TRAY) ×2 IMPLANT
PAD ARMBOARD 7.5X6 YLW CONV (MISCELLANEOUS) ×2 IMPLANT
SET BASIN LINEN APH (SET/KITS/TRAYS/PACK) ×2 IMPLANT
SET TUBE SMOKE EVAC HIGH FLOW (TUBING) ×2 IMPLANT
SLEEVE ENDOPATH XCEL 5M (ENDOMECHANICALS) ×2 IMPLANT
SUT MNCRL AB 4-0 PS2 18 (SUTURE) ×4 IMPLANT
SUT VICRYL 0 UR6 27IN ABS (SUTURE) ×2 IMPLANT
SYR 20ML LL LF (SYRINGE) ×4 IMPLANT
SYS BAG RETRIEVAL 10MM (BASKET) ×1
SYSTEM BAG RETRIEVAL 10MM (BASKET) ×1 IMPLANT
TROCAR ENDO BLADELESS 11MM (ENDOMECHANICALS) ×2 IMPLANT
TROCAR XCEL NON-BLD 5MMX100MML (ENDOMECHANICALS) ×2 IMPLANT
TROCAR XCEL UNIV SLVE 11M 100M (ENDOMECHANICALS) ×2 IMPLANT
TUBE CONNECTING 12X1/4 (SUCTIONS) ×2 IMPLANT
WARMER LAPAROSCOPE (MISCELLANEOUS) ×2 IMPLANT

## 2021-04-21 NOTE — Transfer of Care (Signed)
Immediate Anesthesia Transfer of Care Note  Patient: Felicia Martinez  Procedure(s) Performed: LAPAROSCOPIC CHOLECYSTECTOMY (Abdomen)  Patient Location: PACU  Anesthesia Type:General  Level of Consciousness: drowsy, patient cooperative and responds to stimulation  Airway & Oxygen Therapy: Patient Spontanous Breathing  Post-op Assessment: Report given to RN, Post -op Vital signs reviewed and stable and Patient moving all extremities X 4  Post vital signs: Reviewed and stable  Last Vitals:  Vitals Value Taken Time  BP 119/81 04/21/21 1308  Temp    Pulse 71 04/21/21 1310  Resp 15 04/21/21 1310  SpO2 97 % 04/21/21 1310  Vitals shown include unvalidated device data.  Last Pain:  Vitals:   04/21/21 1158  PainSc: 0-No pain         Complications: No notable events documented.

## 2021-04-21 NOTE — Anesthesia Procedure Notes (Signed)
Procedure Name: Intubation Date/Time: 04/21/2021 12:30 PM Performed by: Brynda Peon, CRNA Pre-anesthesia Checklist: Patient identified, Emergency Drugs available, Suction available, Patient being monitored and Timeout performed Patient Re-evaluated:Patient Re-evaluated prior to induction Oxygen Delivery Method: Circle system utilized Preoxygenation: Pre-oxygenation with 100% oxygen Induction Type: IV induction Laryngoscope Size: Miller and 2 Grade View: Grade I Tube type: Oral Tube size: 6.5 mm Number of attempts: 1 Airway Equipment and Method: Stylet Placement Confirmation: ETT inserted through vocal cords under direct vision, positive ETCO2, CO2 detector and breath sounds checked- equal and bilateral Secured at: 21 cm Tube secured with: Tape Dental Injury: Teeth and Oropharynx as per pre-operative assessment

## 2021-04-21 NOTE — Anesthesia Postprocedure Evaluation (Signed)
Anesthesia Post Note  Patient: Felicia Martinez  Procedure(s) Performed: LAPAROSCOPIC CHOLECYSTECTOMY (Abdomen)  Patient location during evaluation: Phase II Anesthesia Type: General Level of consciousness: awake and alert and oriented Pain management: pain level controlled Vital Signs Assessment: post-procedure vital signs reviewed and stable Respiratory status: spontaneous breathing and respiratory function stable Cardiovascular status: stable and blood pressure returned to baseline Postop Assessment: no apparent nausea or vomiting Anesthetic complications: no   No notable events documented.   Last Vitals:  Vitals:   04/21/21 1445 04/21/21 1458  BP: 115/74 119/75  Pulse: 77 72  Resp: 16 18  Temp:  36.5 C  SpO2:  100%    Last Pain:  Vitals:   04/21/21 1458  TempSrc: Oral  PainSc: 6                  [name] C [name]

## 2021-04-21 NOTE — Interval H&P Note (Signed)
History and Physical Interval Note:  04/21/2021 12:18 PM  Felicia Martinez  has presented today for surgery, with the diagnosis of Cholelithiasis.  The various methods of treatment have been discussed with the patient and family. After consideration of risks, benefits and other options for treatment, the patient has consented to  Procedure(s): LAPAROSCOPIC CHOLECYSTECTOMY (N/A) as a surgical intervention.  The patient's history has been reviewed, patient examined, no change in status, stable for surgery.  I have reviewed the patient's chart and labs.  Questions were answered to the patient's satisfaction.     Franky Macho

## 2021-04-21 NOTE — Op Note (Signed)
Patient:  Felicia Martinez  DOB:  2001/03/04  MRN:  [id number]   Preop Diagnosis: Biliary colic, cholelithiasis  Postop Diagnosis: Same  Procedure: Laparoscopic cholecystectomy  Surgeon: Franky Macho, MD  Anes: General endotracheal  Indications: Patient is a 20 year old Hispanic female who presents with biliary colic secondary to cholelithiasis.  The risks and benefits of the procedure including bleeding, infection, hepatobiliary injury, and the possibility of an open procedure were fully explained to the patient, who gave informed consent.  Procedure note: The patient was placed in the supine position.  After general anesthesia was administered, the abdomen was prepped and draped using the usual sterile technique with ChloraPrep.  Surgical site confirmation was performed.  A supraumbilical incision was made down to the fascia.  Veress needle was introduced into the abdominal cavity and confirmation of placement was done using the saline drop test.  The abdomen was then insufflated to 15 mmHg pressure.  An 11 mm trocar was introduced into the abdominal cavity under direct visualization without difficulty.  The patient was placed in reverse Trendelenburg position and an additional 11 mm trocar was placed in the epigastric region and 5 mm trochars were placed the right upper quadrant and right flank regions.  Liver was inspected and noted to be within normal limits.  The gallbladder was retracted in a dynamic fashion in order to provide a critical view of the triangle of Calot.  The cystic duct was first identified.  Its juncture to the infundibulum was fully identified.  Endoclips were placed proximally and distally on cystic duct, and the cystic duct was divided.  This was likewise done to the cystic artery.  The gallbladder was freed away from the gallbladder fossa using Bovie electrocautery.  The gallbladder was delivered through the epigastric trocar site using an Endo Catch bag.  All  wounds were irrigated with normal saline.  All wounds were injected with Exparel.  All incisions were closed using a 4-0 Monocryl subcuticular suture.  Dermabond was applied.  All tape and needle counts were correct at the end of the procedure.  The patient was extubated in the operating room and transferred to PACU in stable condition.  Complications: None  EBL: Minimal  Specimen: Gallbladder

## 2021-04-21 NOTE — Anesthesia Preprocedure Evaluation (Signed)
Anesthesia Evaluation  Patient identified by MRN, date of birth, ID band Patient awake    Reviewed: Allergy & Precautions, NPO status , Patient's Chart, lab work & pertinent test results  Airway        Dental  (+) Dental Advisory Given   Pulmonary asthma ,    Pulmonary exam normal breath sounds clear to auscultation       Cardiovascular negative cardio ROS Normal cardiovascular exam Rhythm:Regular Rate:Normal     Neuro/Psych  Headaches, PSYCHIATRIC DISORDERS    GI/Hepatic negative GI ROS, (+)     substance abuse  marijuana use,   Endo/Other  negative endocrine ROS  Renal/GU negative Renal ROS  negative genitourinary   Musculoskeletal negative musculoskeletal ROS (+)   Abdominal   Peds negative pediatric ROS (+)  Hematology negative hematology ROS (+)   Anesthesia Other Findings   Reproductive/Obstetrics negative OB ROS                             Anesthesia Physical Anesthesia Plan  ASA: 2  Anesthesia Plan: General   Post-op Pain Management:    Induction: Intravenous  PONV Risk Score and Plan: 4 or greater and Ondansetron, Dexamethasone, Midazolam and Scopolamine patch - Pre-op  Airway Management Planned: Oral ETT  Additional Equipment:   Intra-op Plan:   Post-operative Plan: Extubation in OR  Informed Consent: I have reviewed the patients History and Physical, chart, labs and discussed the procedure including the risks, benefits and alternatives for the proposed anesthesia with the patient or authorized representative who has indicated his/her understanding and acceptance.     Dental advisory given  Plan Discussed with: CRNA and Surgeon  Anesthesia Plan Comments:        Anesthesia Quick Evaluation

## 2021-04-22 ENCOUNTER — Encounter (HOSPITAL_COMMUNITY): Payer: Self-pay | Admitting: General Surgery

## 2021-04-22 LAB — SURGICAL PATHOLOGY

## 2021-05-05 ENCOUNTER — Other Ambulatory Visit: Payer: Self-pay

## 2021-05-05 ENCOUNTER — Ambulatory Visit (INDEPENDENT_AMBULATORY_CARE_PROVIDER_SITE_OTHER): Payer: Self-pay | Admitting: Licensed Clinical Social Worker

## 2021-05-05 DIAGNOSIS — Z91199 Patient's noncompliance with other medical treatment and regimen due to unspecified reason: Secondary | ICD-10-CM

## 2021-05-05 NOTE — Progress Notes (Signed)
LCSW counselor tried to connect with patient for scheduled appointment via MyChart video text request x 2 and email request; also tried to connect via phone without success. LCSW counselor left message for patient to call office number to reschedule OPT appointment.  

## 2021-05-06 ENCOUNTER — Telehealth (INDEPENDENT_AMBULATORY_CARE_PROVIDER_SITE_OTHER): Payer: Medicaid Other | Admitting: General Surgery

## 2021-05-06 DIAGNOSIS — Z09 Encounter for follow-up examination after completed treatment for conditions other than malignant neoplasm: Secondary | ICD-10-CM

## 2021-05-06 NOTE — Telephone Encounter (Signed)
Virtual postoperative telephone visit performed with patient.  She states she is doing well.  Her postoperative incisional pain has resolved.  She has no other issues.  I told her to call me should any arise.  As this was a part of the global surgical fee, this was not a billable visit.  Total telephone time was 1 minute.

## 2021-05-12 ENCOUNTER — Other Ambulatory Visit: Payer: Self-pay | Admitting: Psychiatry

## 2021-05-12 ENCOUNTER — Telehealth: Payer: Self-pay

## 2021-05-12 MED ORDER — BUPROPION HCL ER (XL) 150 MG PO TB24: 450.0000 mg | ORAL_TABLET | Freq: Every day | ORAL | 0 refills | Status: DC

## 2021-05-12 NOTE — Telephone Encounter (Signed)
received fax requesting a 90 day supply of the bupropion hcl

## 2021-05-12 NOTE — Telephone Encounter (Signed)
Ordered

## 2021-07-02 NOTE — Progress Notes (Signed)
Virtual Visit via Video Note  I connected with Felicia Martinez on 07/03/21 at  4:30 PM EST by a video enabled telemedicine application and verified that I am speaking with the correct person using two identifiers.  Location: Patient: home Provider: office Persons participated in the visit- patient, provider    I discussed the limitations of evaluation and management by telemedicine and the availability of in person appointments. The patient expressed understanding and agreed to proceed.    I discussed the assessment and treatment plan with the patient. The patient was provided an opportunity to ask questions and all were answered. The patient agreed with the plan and demonstrated an understanding of the instructions.   The patient was advised to call back or seek an in-person evaluation if the symptoms worsen or if the condition fails to improve as anticipated.  I provided 21 minutes of non-face-to-face time during this encounter.   Neysa Hottereina Nyquan Selbe, MD    [facility] MD/PA/NP OP Progress Note  07/03/2021 5:12 PM Felicia Martinez  MRN:  [id number]  Chief Complaint:  Chief Complaint   Depression; Follow-up; Anxiety    HPI:  This is a follow-up appointment for depression and anxiety.  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  She is planning to be off from school until fall due to financial strain.  She had a good holiday with her family.  She notices that she has been feeling more anxious lately.  It happens when she is stressed, or even when everything is fine.  She tends to think about many things.  She states that she used weed until last month.  It makes her feel guilty as she was spending time doing it rather than doing things.  She now uses CBD every day for anxiety, insomnia and back pain.  She is able to focus well when she is doing something she is interested such as video editing.  She may need more attention when  she is back to school.  She has gained weight, which she attributes to being inactive after surgery.  She is planning to go to gym as she has a Research scientist (physical sciences)membership.  She denies feeling depressed or anhedonia.  She denies SI.  She denies panic attacks.  She feels irritable at times.  She denies alcohol use or other drug use.  She is willing to try Lexapro at this time.   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. Hope to become a Runner, broadcasting/film/videoteacher. Took honors in high school [place] "she describes her childhood as "nice childhood though poor." Her parents [place] from [place] and separated. She has good relationship with both of her parents  Visit Diagnosis:    ICD-10-CM   1. Recurrent major depressive disorder, in partial remission (HCC)  F33.41     2. GAD (generalized anxiety disorder)  F41.1     3. Insomnia, unspecified type  G47.00 Ambulatory referral to Pulmonology      Past Psychiatric History: Please see initial evaluation for full details. I have reviewed the history. No updates at this time.     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 I have reviewed the history. No updates at this time.     Family History:  Family History  Problem Relation Age of Onset   Hypertension Mother    Anxiety disorder Mother     Depression Mother    Lung cancer Maternal Grandmother    Schizophrenia Maternal Aunt     Social History:  Social History   Socioeconomic History   Marital status: Single    Spouse name: Not on file   Number of children: Not on file   Years of education: Not on file   Highest education level: Not on file  Occupational History   Not on file  Tobacco Use   Smoking status: Never   Smokeless tobacco: Never  Vaping Use   Vaping Use: Never used  Substance and Sexual Activity   Alcohol use: Yes    Comment: occasional   Drug use: Yes    Types: Marijuana   Sexual activity: Yes    Birth control/protection: Condom    Comment: menarch age [age].  Lives with mom.  Parents separated.  Other Topics Concern   Not on file  Social History Narrative   In 9th grade at [employer]. She is performing good in school.  No regular exercise.  More than 3 hours of screen time per day.  Enjoys watching netflix, writing poetry and talking to friends.    Social Determinants of Health   Financial Resource Strain: Not on file  Food Insecurity: Not on file  Transportation Needs: Not on file  Physical Activity: Not on file  Stress: Not on file  Social Connections: Not on file    Allergies:  Allergies  Allergen Reactions   Other     Seasonal Allergies-pollen    Metabolic Disorder Labs: Lab Results  Component Value Date   HGBA1C 5.4 08/06/2020   MPG 108 08/06/2020   MPG 114 10/18/2019   Lab Results  Component Value Date   PROLACTIN 8.8 01/17/2015   Lab Results  Component Value Date   CHOL 169 10/18/2019   TRIG 153 (H) 10/18/2019   HDL 44 (L) 10/18/2019   CHOLHDL 3.8 10/18/2019   VLDL 35 (H) 08/02/2015   LDLCALC 100 10/18/2019   LDLCALC 110 (H) 08/02/2015   Lab Results  Component Value Date   TSH 2.280 03/17/2021   TSH 1.30 03/07/2020    Therapeutic Level Labs: No results found for: LITHIUM No results found for: VALPROATE No components found for:  CBMZ  Current  Medications: Current Outpatient Medications  Medication Sig Dispense Refill   escitalopram (LEXAPRO) 10 MG tablet 5 mg daily for one week, then 10 mg daily 30 tablet 1   [START ON 08/11/2021] buPROPion (WELLBUTRIN XL) 150 MG 24 hr tablet Take 3 tablets (450 mg total) by mouth daily. 270 tablet 0   buPROPion (WELLBUTRIN XL) 300 MG 24 hr tablet Take 1 tablet (300 mg total) by mouth daily. 30 tablet 1   HYDROcodone-acetaminophen (NORCO) 5-325 MG tablet Take 1 tablet by mouth every 6 (six) hours as needed for moderate pain. 30 tablet 0   ondansetron (ZOFRAN) 4 MG  tablet Take 1 tablet (4 mg total) by mouth every 8 (eight) hours as needed for nausea or vomiting. (Patient not taking: Reported on 04/11/2021) 20 tablet 0   No current facility-administered medications for this visit.     Musculoskeletal: Strength & Muscle Tone:  N/A Gait & Station:  N/A Patient leans: N/A  Psychiatric Specialty Exam: Review of Systems  Psychiatric/Behavioral:  Positive for decreased concentration and sleep disturbance. Negative for agitation, behavioral problems, confusion, dysphoric mood, hallucinations, self-injury and suicidal ideas. The patient is nervous/anxious. The patient is not hyperactive.   All other systems reviewed and are negative.  There were no vitals taken for this visit.There is no height or weight on file to calculate BMI.  General Appearance: Fairly Groomed  Eye Contact:  Good  Speech:  Clear and Coherent  Volume:  Normal  Mood:  Anxious  Affect:  Appropriate, Congruent, and fatigue  Thought Process:  Coherent  Orientation:  Full (Time, Place, and Person)  Thought Content: Logical   Suicidal Thoughts:  No  Homicidal Thoughts:  No  Memory:  Immediate;   Good  Judgement:  Good  Insight:  Good  Psychomotor Activity:  Normal  Concentration:  Concentration: Good and Attention Span: Good  Recall:  Good  Fund of Knowledge: Good  Language: Good  Akathisia:  No  Handed:  Right  AIMS (if  indicated): not done  Assets:  Communication Skills Desire for Improvement  ADL's:  Intact  Cognition: WNL  Sleep:  Poor   Screenings: GAD-7    Flowsheet Row Office Visit from 12/21/2019 in [facility]  Total GAD-7 Score 5      PHQ2-9    Flowsheet Row Video Visit from 04/15/2021 in [facility] Counselor from 03/31/2021 in [facility] Video Visit from 03/06/2021 in [facility] Video Visit from 10/08/2020 in [facility] Video Visit from 09/10/2020 in [facility]  PHQ-2 Total Score 2 2 2  0 1  PHQ-9 Total Score 6 10 10  -- --      Flowsheet Row Admission (Discharged) from 04/21/2021 in [facility] Video Visit from 04/15/2021 in [facility] Counselor from 03/31/2021 in [facility]  C-SSRS RISK CATEGORY No Risk No Risk No Risk        Assessment and Plan:  Felicia Martinez is a 21 y.o. year old female with a history of depression, who presents for follow up appointment for below.   1. MDD (major depressive disorder), recurrent, in partial remission (HCC) 2. GAD (generalized anxiety disorder) # r/o substance induced mood disorder She reports slight worsening in anxiety symptoms, although there has been improvement in depressive symptoms since up titration of bupropion.  Psychosocial stressors include financial strain.  Will start Lexapro to target anxiety. Discussed potential risk of increase in SI in younger population. Will continue bupropion at the current dose as maintenance treatment for depression.    2. Attention deficit hyperactivity disorder (ADHD), unspecified ADHD type Although she has occasional difficulty in concentration, it has been manageable now that she does not go to school.  Will continue to hold Concerta.   # Weed/CBD use She has started to  use weed for anxiety, insomnia and pain.  Provided psychoeducation about its potential long-term negative impact on her mood.  She is willing to be abstinent from this.  Will continue motivational interview.    3. Insomnia, unspecified type Although referral was made due  to fatigue, snoring and insomnia, she has not heard back from the clinic despite her attempts.  Will make referral to another clinic.     Plan Continue bupropion 450 mg daily Start Lexapro 5 mg daily for 1 week, then 10 mg daily Next appointment: 2/23 at 4 PM for 30 mins, video    This clinician has discussed the side effect associated with medication prescribed during this encounter. Please refer to notes in the previous encounters for more details.     Past trials of medication: Abilify (weight gain) Trazodone, melatonin (felt sad)   The patient demonstrates the following risk factors for suicide: Chronic risk factors for suicide include: psychiatric disorder of depression. Acute risk factors for suicide include: N/A. Protective factors for this patient include: positive social support, coping skills and hope for the future. Considering these factors, the overall suicide risk at this point appears to be low. Patient is appropriate for outpatient follow up      Neysa Hotter, MD 07/03/2021, 5:12 PM

## 2021-07-03 ENCOUNTER — Telehealth (INDEPENDENT_AMBULATORY_CARE_PROVIDER_SITE_OTHER): Payer: Medicaid Other | Admitting: Psychiatry

## 2021-07-03 ENCOUNTER — Other Ambulatory Visit: Payer: Self-pay

## 2021-07-03 ENCOUNTER — Encounter: Payer: Self-pay | Admitting: Psychiatry

## 2021-07-03 DIAGNOSIS — F3341 Major depressive disorder, recurrent, in partial remission: Secondary | ICD-10-CM

## 2021-07-03 DIAGNOSIS — G4700 Insomnia, unspecified: Secondary | ICD-10-CM | POA: Diagnosis not present

## 2021-07-03 DIAGNOSIS — F411 Generalized anxiety disorder: Secondary | ICD-10-CM

## 2021-07-03 MED ORDER — BUPROPION HCL ER (XL) 150 MG PO TB24: 450.0000 mg | ORAL_TABLET | Freq: Every day | ORAL | 0 refills | Status: DC

## 2021-07-03 MED ORDER — ESCITALOPRAM OXALATE 10 MG PO TABS: ORAL_TABLET | ORAL | 1 refills | Status: DC

## 2021-07-03 NOTE — Patient Instructions (Signed)
Continue bupropion 450 mg daily Start Lexapro 5 mg daily for 1 week, then 10 mg daily Next appointment: 2/23 at 4 PM

## 2021-07-26 ENCOUNTER — Other Ambulatory Visit: Payer: Self-pay | Admitting: Psychiatry

## 2021-08-18 NOTE — Progress Notes (Signed)
Virtual Visit via Video Note  I connected with Felicia Martinez on 08/21/21 at  4:00 PM EST by a video enabled telemedicine application and verified that I Martinez speaking with the correct person using two identifiers.  Location: Patient: home Provider: office Persons participated in the visit- patient, provider    I discussed the limitations of evaluation and management by telemedicine and the availability of in person appointments. The patient expressed understanding and agreed to proceed.    I discussed the assessment and treatment plan with the patient. The patient was provided an opportunity to ask questions and all were answered. The patient agreed with the plan and demonstrated an understanding of the instructions.   The patient was advised to call back or seek an in-person evaluation if the symptoms worsen or if the condition fails to improve as anticipated.  I provided 20 minutes of non-face-to-face time during this encounter.   Norman Clay, MD   [facility] MD/PA/NP OP Progress Note  08/21/2021 5:15 PM Felicia Martinez  MRN:  [id number]  Chief Complaint:  Chief Complaint  Patient presents with   Follow-up   Depression   HPI:  This is a follow-up appointment for depression and anxiety.  She states that although she has been feeling better since starting Lexapro, she feels tired.  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  Although she feels excited, she feels nervous.  She hopes that her depression will not come back after starting work.  She was feeling more anxious and was having panic attack when she started Lexapro, then after up titration.  It has been getting better.  She continues to have insomnia.  She has an upcoming appointment for sleep evaluation.  She denies change in an appetite.  She denies SI.  She struggles with focus. She denies alcohol use. She uses delta 8 for pain, every day or every other day.  She is willing to try higher dose of Lexapro at  this time.   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 Hope to become a Pharmacist, hospital. Took honors in high school She grew up in [place], "she describes her childhood as "nice childhood though poor." Her parents are from [place], and separated. She has good relationship with both of her parents  Wt Readings from Last 3 Encounters:  04/15/21 190 lb (86.2 kg)  03/25/21 191 lb (86.6 kg)  02/28/21 182 lb (82.6 kg)     Visit Diagnosis:    ICD-10-CM   1. MDD (major depressive disorder), recurrent episode, mild ([place])  F33.0     2. GAD (generalized anxiety disorder)  F41.1       Past Psychiatric History: Please see initial evaluation for full details. I have reviewed the history. No updates at this time.     Past Medical History:  Past Medical History:  Diagnosis Date   Allergy    Asthma    Morbid obesity ([place])    Schizophrenia in children     Past Surgical History:  Procedure Laterality Date   CHOLECYSTECTOMY N/A 04/21/2021   Procedure: LAPAROSCOPIC CHOLECYSTECTOMY;  Surgeon: Aviva Signs, MD;  Location: [facility];  Service: General;  Laterality: N/A;    Family Psychiatric History: Please see  initial evaluation for full details. I have reviewed the history. No updates at this time.     Family History:  Family History  Problem Relation Age of Onset   Hypertension Mother    Anxiety disorder Mother    Depression Mother    Lung cancer Maternal Grandmother    Schizophrenia Maternal Aunt     Social History:  Social History   Socioeconomic History   Marital status: Single    Spouse name: Not on file   Number of children: Not on file   Years of education: Not on file   Highest education  level: Not on file  Occupational History   Not on file  Tobacco Use   Smoking status: Never   Smokeless tobacco: Never  Vaping Use   Vaping Use: Never used  Substance and Sexual Activity   Alcohol use: Yes    Comment: occasional   Drug use: Yes    Types: Marijuana   Sexual activity: Yes    Birth control/protection: Condom    Comment: menarch age 41.  Lives with mom.  Parents separated.  Other Topics Concern   Not on file  Social History Narrative   In 9th grade at [employer]. She is performing good in school.  No regular exercise.  More than 3 hours of screen time per day.  Enjoys watching netflix, writing poetry and talking to friends.    Social Determinants of Health   Financial Resource Strain: Not on file  Food Insecurity: Not on file  Transportation Needs: Not on file  Physical Activity: Not on file  Stress: Not on file  Social Connections: Not on file    Allergies:  Allergies  Allergen Reactions   Other     Seasonal Allergies-pollen    Metabolic Disorder Labs: Lab Results  Component Value Date   HGBA1C 5.4 08/06/2020   MPG 108 08/06/2020   MPG 114 10/18/2019   Lab Results  Component Value Date   PROLACTIN 8.8 01/17/2015   Lab Results  Component Value Date   CHOL 169 10/18/2019   TRIG 153 (H) 10/18/2019   HDL 44 (L) 10/18/2019   CHOLHDL 3.8 10/18/2019   VLDL 35 (H) 08/02/2015   LDLCALC 100 10/18/2019   LDLCALC 110 (H) 08/02/2015   Lab Results  Component Value Date   TSH 2.280 03/17/2021   TSH 1.30 03/07/2020    Therapeutic Level Labs: No results found for: LITHIUM No results found for: VALPROATE No components found for:  CBMZ  Current Medications: Current Outpatient Medications  Medication Sig Dispense Refill   buPROPion (WELLBUTRIN XL) 150 MG 24 hr tablet Take 3 tablets (450 mg total) by mouth daily. 270 tablet 0   escitalopram (LEXAPRO) 10 MG tablet Take 1.5 tablets (15 mg total) by mouth daily. 135 tablet 0    HYDROcodone-acetaminophen (NORCO) 5-325 MG tablet Take 1 tablet by mouth every 6 (six) hours as needed for moderate pain. 30 tablet 0   ondansetron (ZOFRAN) 4 MG tablet Take 1 tablet (4 mg total) by mouth every 8 (eight) hours as needed for nausea or vomiting. (Patient not taking: Reported on 04/11/2021) 20 tablet 0   No current facility-administered medications for this visit.     Musculoskeletal: Strength & Muscle Tone:  N/A Gait & Station:  N/A Patient leans: N/A  Psychiatric Specialty Exam: Review of Systems  Psychiatric/Behavioral:  Positive for decreased concentration, dysphoric mood and sleep disturbance. Negative for agitation, behavioral problems, confusion, hallucinations, self-injury and suicidal ideas. The  patient is nervous/anxious. The patient is not hyperactive.   All other systems reviewed and are negative.  There were no vitals taken for this visit.There is no height or weight on file to calculate BMI.  General Appearance: Fairly Groomed  Eye Contact:  Good  Speech:  Clear and Coherent  Volume:  Normal  Mood:   tired  Affect:  Appropriate, Congruent, and calm  Thought Process:  Coherent  Orientation:  Full (Time, Place, and Person)  Thought Content: Logical   Suicidal Thoughts:  No  Homicidal Thoughts:  No  Memory:  Immediate;   Good  Judgement:  Good  Insight:  Good  Psychomotor Activity:  Normal  Concentration:  Concentration: Good and Attention Span: Good  Recall:  Good  Fund of Knowledge: Good  Language: Good  Akathisia:  No  Handed:  Right  AIMS (if indicated): not done  Assets:  Communication Skills Desire for Improvement  ADL's:  Intact  Cognition: WNL  Sleep:  Poor   Screenings: GAD-7    Flowsheet Row Office Visit from 12/21/2019 in [place]  Total GAD-7 Score 5      PHQ2-9    Flowsheet Row Video Visit from 04/15/2021 in [place] from 03/31/2021 in [place] Video Visit from 03/06/2021 in [place] Video Visit from 10/08/2020 in [place] Video Visit from 09/10/2020 in [place]  PHQ-2 Total Score 2 2 2  0 1  PHQ-9 Total Score 6 10 10  -- --      Flowsheet Row Admission (Discharged) from 04/21/2021 in [facility] Video Visit from 04/15/2021 in [facility] Counselor from 03/31/2021 in [place] No Risk No Risk No Risk        Assessment and Plan:  Felicia Martinez is a 21 y.o. year old female with a history of depression, who presents for follow up appointment for below.   1. MDD (major depressive disorder), recurrent episode, mild ([place]) 2. GAD (generalized anxiety disorder) # r/o substance induced mood disorder Although she reports overall improvement in depressive symptoms since starting Lexapro, she continues to have prominent fatigue with occasional anxiety.  Psychosocial stressors include financial strain.  Will uptitrate Lexapro to target depression and anxiety.  Will do this up titration slowly given she had a paradoxical reaction of anxiety when she was started on this medication.  Discussed potential risk of SI in younger population.  Will continue bupropion at the current dose as adjunctive treatment for depression.   # inattention Etiology is multifactorial given her mood symptoms, and CBD use. She has had a good benefit from Concerta in the past, although it has been on hold given she has been able to manage things well.  Will consider restarting this medication in the future if any worsening.     # Weed/CBD use She is motivated to cut down CBD use, which she uses for pain.  She has not used weed since the last visit.  Will continue motivational interview.    3. Insomnia, unspecified type Referral was made for evaluation of sleep apnea.   She has an upcoming appointment for this.     Plan Continue bupropion 450 mg daily Increase lexapro 15 mg daily  Next appointment: 3/23 at 8 Martinez for 30 mins, in person     This clinician has discussed the side effect associated with medication prescribed during this  encounter. Please refer to notes in the previous encounters for more details.     Past trials of medication: Abilify (weight gain) Trazodone, melatonin (felt sad)   The patient demonstrates the following risk factors for suicide: Chronic risk factors for suicide include: psychiatric disorder of depression. Acute risk factors for suicide include: N/A. Protective factors for this patient include: positive social support, coping skills and hope for the future. Considering these factors, the overall suicide risk at this point appears to be low. Patient is appropriate for outpatient follow up          Collaboration of Care: Collaboration of Care: Other referred for evaluation of sleep apnea  Consent: Patient/Guardian gives verbal consent for treatment and assignment of benefits for services provided during this visit. Patient/Guardian expressed understanding and agreed to proceed.    Norman Clay, MD 08/21/2021, 5:15 PM

## 2021-08-21 ENCOUNTER — Other Ambulatory Visit: Payer: Self-pay

## 2021-08-21 ENCOUNTER — Other Ambulatory Visit: Payer: Self-pay | Admitting: Psychiatry

## 2021-08-21 ENCOUNTER — Encounter: Payer: Self-pay | Admitting: Psychiatry

## 2021-08-21 ENCOUNTER — Telehealth (INDEPENDENT_AMBULATORY_CARE_PROVIDER_SITE_OTHER): Payer: Medicaid Other | Admitting: Psychiatry

## 2021-08-21 DIAGNOSIS — F411 Generalized anxiety disorder: Secondary | ICD-10-CM | POA: Diagnosis not present

## 2021-08-21 DIAGNOSIS — F330 Major depressive disorder, recurrent, mild: Secondary | ICD-10-CM

## 2021-08-21 MED ORDER — ESCITALOPRAM OXALATE 5 MG PO TABS: 5.0000 mg | ORAL_TABLET | Freq: Every day | ORAL | 1 refills | Status: DC

## 2021-08-21 MED ORDER — ESCITALOPRAM OXALATE 10 MG PO TABS: 15.0000 mg | ORAL_TABLET | Freq: Every day | ORAL | 0 refills | Status: DC

## 2021-08-21 NOTE — Addendum Note (Signed)
Addended by: Neysa Hotter on: 08/21/2021 05:17 PM   Modules accepted: Orders

## 2021-08-21 NOTE — Patient Instructions (Signed)
Continue bupropion 450 mg daily Increase lexapro 15 mg daily  Next appointment: 3/23 at 8 AM, in person   [facility]  Address: [address], [place], [place] [address]

## 2021-08-22 ENCOUNTER — Other Ambulatory Visit: Payer: Self-pay

## 2021-08-22 ENCOUNTER — Ambulatory Visit (INDEPENDENT_AMBULATORY_CARE_PROVIDER_SITE_OTHER): Payer: Medicaid Other

## 2021-08-22 ENCOUNTER — Ambulatory Visit (INDEPENDENT_AMBULATORY_CARE_PROVIDER_SITE_OTHER): Payer: Medicaid Other | Admitting: Primary Care

## 2021-08-22 ENCOUNTER — Encounter: Payer: Self-pay | Admitting: Primary Care

## 2021-08-22 VITALS — BP 100/78 | HR 95 | Ht 61.0 in | Wt 179.2 lb

## 2021-08-22 DIAGNOSIS — R0683 Snoring: Secondary | ICD-10-CM

## 2021-08-22 DIAGNOSIS — J209 Acute bronchitis, unspecified: Secondary | ICD-10-CM | POA: Diagnosis not present

## 2021-08-22 DIAGNOSIS — R051 Acute cough: Secondary | ICD-10-CM

## 2021-08-22 DIAGNOSIS — R053 Chronic cough: Secondary | ICD-10-CM | POA: Insufficient documentation

## 2021-08-22 NOTE — Assessment & Plan Note (Addendum)
-   Patient has had a np cough x 1 week, currently vapes CBD. No associated shortness of breath. Checking CXR. May consider PFTs in the future d.t hx asthma if no improvement with smoking cessation and over the counter mucinex-dm.

## 2021-08-22 NOTE — Progress Notes (Signed)
@Patient  ID: Felicia Martinez, female    DOB: July 20, 2000, 21 y.o.   MRN: [id number]  Chief Complaint  Patient presents with   Sleep Apnea    Psy has referred her    Cough    Cbd vape pen     Referring provider: Norman Clay, MD  HPI: 21 year old female, current vape use. PMH significant for asthma, migraine headache, obesity.   08/22/2021 Patient presents today for sleep consult, referred by psychiatry d.t snoring and insomnia. She has symptoms of loud snoring, gasping for breath at night, restless sleep, daytime fatigue.  She has trouble fallings and staying asleep. She is not currently taking anything for sleep. She is on Lexapro which was recently increased to 43m daily. Typical bedtime is between 10pm-3am. It takes her awhile to fall asleep, states that she has to be exhausted to fall asleep. She wakes up on average 1-3 times a night. She gets out of bed to start her day 8-11am. She is not currently employed. She is starting a new job next week which is 9-5pm.   She also has a np cough which started 1 week ago. She has hx marijuana use and currently vapes CBD daily. She only experiences wheezing with coughing fits. No overt shortness of breath. Hx asthma, prior to this she had no daily asthma symptoms. She would like to avoid using prednisone unless needed. She has not tried taking anything over the counter for cough. Denies chest tightness.    Sleep questionnaire Symptoms- loud snoring, restless sleep, insomnia, daytime sleepiness Prior sleep study- none Bedtime- 10pm-3am Time to fall asleep- hours  Nocturnal awakenings- 1-3 Out of bed/started today- 8-11am Weight changes- +30 lbs Epworth- 8   Allergies  Allergen Reactions   Other     Seasonal Allergies-pollen    Immunization History  Administered Date(s) Administered   DTaP 11/23/2000, 02/08/2001, 04/19/2001, 12/13/2001, 03/20/2005   HPV 9-valent 07/30/2015, 08/21/2016   Hepatitis B 008-22-02 10/12/2000,  04/19/2001   HiB (PRP-OMP) 11/23/2000, 02/08/2001, 04/19/2001, 12/13/2001   IPV 11/23/2000, 02/08/2001, 04/19/2001, 03/20/2005   Influenza Whole 03/20/2010   Influenza,inj,Quad PF,6+ Mos 04/11/2013, 07/03/2014, 05/12/2016, 05/26/2017, 04/27/2019, 07/25/2020   Influenza-Unspecified 04/04/2015   MMR 12/13/2001, 03/20/2005   Meningococcal B Recombinant 08/27/2017   Meningococcal Conjugate 05/12/2012   Meningococcal Mcv4o 08/27/2017   Moderna Sars-Covid-2 Vaccination 12/14/2019, 01/11/2020   Pneumococcal Conjugate-13 11/23/2000, 02/08/2001, 04/19/2001, 03/26/2003   Td 03/31/2006   Tdap 03/31/2006, 07/25/2020   Varicella 12/13/2001, 03/31/2006    Past Medical History:  Diagnosis Date   Allergy    Asthma    Morbid obesity ([place]    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 270 tablet 0   escitalopram (LEXAPRO) 10 MG tablet Take 1 tablet (10 mg total) by mouth daily. Total of 15 mg daily. Take along with 5 mg tab 90 tablet 0   escitalopram (LEXAPRO) 5 MG tablet Take 1 tablet (5 mg total) by mouth daily. Total of 15 mg daily. Take along with 10 mg tab 30 tablet 1   HYDROcodone-acetaminophen (NORCO) 5-325 MG tablet Take 1 tablet by mouth every 6 (six) hours as needed for moderate pain. 30 tablet 0   ondansetron (  ZOFRAN) 4 MG tablet Take 1 tablet (4 mg total) by mouth every 8 (eight) hours as needed for nausea or vomiting. (Patient not taking: Reported on 04/11/2021) 20 tablet 0   No facility-administered medications prior to visit.    Review of Systems  Review of Systems  Constitutional:  Positive for fatigue.  Respiratory:  Positive for cough.   Psychiatric/Behavioral:  Positive for sleep  disturbance.     Physical Exam  BP 100/78    Pulse 95    Ht 5' 1"  (1.549 m)    Wt 179 lb 3.2 oz (81.3 kg)    SpO2 98%    BMI 33.86 kg/m  Physical Exam Constitutional:      Appearance: Normal appearance.  HENT:     Head: Normocephalic and atraumatic.     Mouth/Throat:     Mouth: Mucous membranes are moist.     Pharynx: Oropharynx is clear.  Cardiovascular:     Rate and Rhythm: Normal rate and regular rhythm.  Pulmonary:     Effort: Pulmonary effort is normal.     Breath sounds: Normal breath sounds. No wheezing, rhonchi or rales.  Musculoskeletal:        General: Normal range of motion.  Skin:    General: Skin is warm and dry.  Neurological:     General: No focal deficit present.     Mental Status: She is alert and oriented to person, place, and time. Mental status is at baseline.  Psychiatric:        Mood and Affect: Mood normal.        Behavior: Behavior normal.        Thought Content: Thought content normal.        Judgment: Judgment normal.     Lab Results:  CBC    Component Value Date/Time   WBC 15.1 (H) 02/16/2021 1759   RBC 4.19 02/16/2021 1759   HGB 12.5 02/16/2021 1759   HCT 38.0 02/16/2021 1759   PLT 304 02/16/2021 1759   MCV 90.7 02/16/2021 1759   MCH 29.8 02/16/2021 1759   MCHC 32.9 02/16/2021 1759   RDW 13.1 02/16/2021 1759   LYMPHSABS 0.9 02/16/2021 1759   MONOABS 0.2 02/16/2021 1759   EOSABS 0.0 02/16/2021 1759   BASOSABS 0.0 02/16/2021 1759    BMET    Component Value Date/Time   NA 135 02/16/2021 1759   K 3.6 02/16/2021 1759   CL 106 02/16/2021 1759   CO2 23 02/16/2021 1759   GLUCOSE 115 (H) 02/16/2021 1759   BUN 12 02/16/2021 1759   CREATININE 0.58 02/16/2021 1759   CREATININE 0.59 08/06/2020 0915   CALCIUM 9.0 02/16/2021 1759   GFRNONAA >60 02/16/2021 1759   GFRNONAA 133 08/06/2020 0915   GFRAA 154 08/06/2020 0915    BNP No results found for: BNP  ProBNP No results found for: PROBNP  Imaging: No results  found.   Assessment & Plan:   Loud snoring - Patient has symptoms of loud snoring, restless sleep, insomnia, daytime sleepiness. Epworth 8. BMI 33. Concern patient could have obstructive sleep apnea, needs home sleep study to evaluate. Discussed risk of untreated sleep apnea including cardiac arrhythmias, pulm HTN, stroke, DM. We briefly reviewed treatment options including weight loss, oral appliance, CPAP therapy, referral to ENT for possible surgical options. Encourage regular sleep/wake schedule, weight loss and side sleeping position. Advised against driving if experiencing excessive daytime sleepiness. Follow-up in 4-6 weeks to review sleep study results and discuss treatment options further.  Acute cough - Patient has had a np cough x 1 week, currently vapes CBD. No associated shortness of breath. Checking CXR. May consider PFTs in the future d.t hx asthma if no improvement with smoking cessation and over the counter mucinex-dm.    Martyn Ehrich, NP 08/22/2021

## 2021-08-22 NOTE — Assessment & Plan Note (Signed)
-   Patient has symptoms of loud snoring, restless sleep, insomnia, daytime sleepiness. Epworth 8. BMI 33. Concern patient could have obstructive sleep apnea, needs home sleep study to evaluate. Discussed risk of untreated sleep apnea including cardiac arrhythmias, pulm HTN, stroke, DM. We briefly reviewed treatment options including weight loss, oral appliance, CPAP therapy, referral to ENT for possible surgical options. Encourage regular sleep/wake schedule, weight loss and side sleeping position. Advised against driving if experiencing excessive daytime sleepiness. Follow-up in 4-6 weeks to review sleep study results and discuss treatment options further.

## 2021-08-22 NOTE — Progress Notes (Signed)
Reviewed and agree with assessment/plan.   Coralyn Helling, MD [facility] 08/22/2021, 3:16 PM Pager:  [phone]

## 2021-08-22 NOTE — Patient Instructions (Addendum)
Sleep apnea is defined as period of 10 seconds or longer when you stop breathing at night. This can happen multiple times a night. Dx sleep apnea is when this occurs more than 5 times an hour.   Mild OSA 5-15 apneic events an hour Moderate OSA 15-30 apneic events an hour Severe OSA > 30 apneic events an hour  Untreated sleep apnea puts you at higher risk for cardiac arrhythmias, pulmonary HTN, stroke and diabetes  Treatment options include weight loss, side sleeping position, oral appliance, CPAP therapy or referral to ENT for possible surgical options   Recommendations: STOP vaping Try taking mucinex-dm twice daily for cough, if not better call and we can try short course of prednisone  Focus on side sleeping position Maintain normal BMI Do not drive if experiencing excessive daytime sleepiness of fatigue   Orders: CXR today re: cough Home sleep study re: snoring   Follow-up: 4-6 week visit to review sleep study results and discuss treatment options further

## 2021-08-22 NOTE — Progress Notes (Signed)
Please let patient know CXR showed prominence of bronchial markings concerning for bronchitis or reactive airway disease such as asthma. I want her to take zpack and also schedule PFTs before fu in 4-6 weeks for sleep.

## 2021-08-25 ENCOUNTER — Telehealth: Payer: Self-pay | Admitting: Primary Care

## 2021-08-25 MED ORDER — PREDNISONE 20 MG PO TABS
40.0000 mg | ORAL_TABLET | Freq: Every day | ORAL | 0 refills | Status: AC
Start: 2021-08-25 — End: 2021-08-30

## 2021-08-25 NOTE — Telephone Encounter (Signed)
She had evidence of bronchitis on her most recent CXR but declined prednisone course. If she is open to prednisone course, I would recommend 40 mg for 5 days. Take in AM with food. Mucinex 600 mg Twice daily, delsym 2 tsp Twice daily for cough and chlortab 4 mg at night for cough - all over the counter. If she's having bronchospasm/wheezing and needs a rescue inhaler, she needs to be seen in the office as this is a change. If SOB/chest tightness worsens, needs to go to the ED for further evaluation. Thanks.

## 2021-08-25 NOTE — Telephone Encounter (Signed)
Called and spoke with pt who states that she feels like her coughing and other symptoms have become worse compared to last visit.  Pt said that she did use her mother's rescue inhaler due to having problems with tightness in chest when she coughed and she said that it did help her when she used it.  Pt does not have a rescue inhaler of her own that she could use.  Asked pt if she was wheezing any and she said at times when she coughs/breathes, she will have some rattling in her chest.  Due to this, pt wants to know what we recommend to help with her symptom. Katie, please advise. Please route this back to triage pool.

## 2021-08-25 NOTE — Telephone Encounter (Signed)
Called and spoke with patient. She verbalized understanding of recs. She wants to try the Mucinex and prednisone. I have sent the prednisone to [facility] in [place].   Nothing further needed at time of call.

## 2021-09-09 ENCOUNTER — Ambulatory Visit: Payer: Medicaid Other | Admitting: Family Medicine

## 2021-09-14 ENCOUNTER — Other Ambulatory Visit: Payer: Self-pay | Admitting: Psychiatry

## 2021-09-15 NOTE — Progress Notes (Deleted)
BH MD/PA/NP OP Progress Note ? ?09/15/2021 3:01 PM ?Felicia Martinez  ?MRN:  [id number] ? ?Chief Complaint: No chief complaint on file. ? ?HPI: *** ?Visit Diagnosis: No diagnosis found. ? ?Past Psychiatric History: Please see initial evaluation for full details. I have reviewed the history. No updates at this time.  ?  ? ?Past Medical History:  ?Past Medical History:  ?Diagnosis Date  ? Allergy   ? Asthma   ? Morbid obesity (HCC)   ? 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  ?Past Surgical History:  ?Procedure Laterality Date  ? CHOLECYSTECTOMY N/A 04/21/2021  ? Procedure: LAPAROSCOPIC CHOLECYSTECTOMY;  Surgeon: Franky MachoJenkins, Mark, MD;  Location: [facility];  Service: General;  Laterality: N/A;  ? ? ?Family Psychiatric History: Please see initial evaluation for full details. I have reviewed the history. No updates at this time.  ?  ? ?Family History:  ?Family History  ?Problem Relation Age of Onset  ? Hypertension Mother   ? Anxiety disorder Mother   ? Depression Mother   ? Lung cancer Maternal Grandmother   ? Schizophrenia Maternal Aunt   ? ? ?Social History:  ?Social History  ? ?Socioeconomic History  ? Marital status: Single  ?  Spouse name: Not on file  ? Number of children: Not on file  ? Years of education: Not on file  ? Highest education level: Not on file  ?Occupational History  ? Not on file  ?Tobacco Use  ? Smoking status: Never  ? Smokeless tobacco: Never  ?Vaping Use  ? Vaping Use: Never used  ?Substance and Sexual Activity  ? Alcohol use: Yes  ?  Comment: occasional  ? Drug use: Yes  ?  Types: Marijuana  ? Sexual activity: Yes  ?  Birth control/protection: Condom  ?  Comment: menarch age 89.  Lives with mom.  Parents separated.  ?Other Topics Concern  ? Not on file  ?Social History Narrative  ? In 9th grade at [employer]. She is performing good in school.  No regular exercise.  More than 3 hours of screen time per day.  Enjoys watching netflix, writing poetry and talking to friends.   ? ?Social Determinants of  Health  ? ?Financial Resource Strain: Not on file  ?Food Insecurity: Not on file  ?Transportation Needs: Not on file  ?Physical Activity: Not on file  ?Stress: Not on file  ?Social Connections: Not on file  ? ? ?Allergies:  ?Allergies  ?Allergen Reactions  ? Other   ?  Seasonal Allergies-pollen  ? ? ?Metabolic Disorder Labs: ?Lab Results  ?Component Value Date  ? HGBA1C 5.4 08/06/2020  ? MPG 108 08/06/2020  ? MPG 114 10/18/2019  ? ?Lab Results  ?Component Value Date  ? PROLACTIN 8.8 01/17/2015  ? ?Lab Results  ?Component Value Date  ? CHOL 169 10/18/2019  ? TRIG 153 (H) 10/18/2019  ? HDL 44 (L) 10/18/2019  ? CHOLHDL 3.8 10/18/2019  ? VLDL 35 (H) 08/02/2015  ? LDLCALC 100 10/18/2019  ? LDLCALC 110 (H) 08/02/2015  ? ?Lab Results  ?Component Value Date  ? TSH 2.280 03/17/2021  ? TSH 1.30 03/07/2020  ? ? ?Therapeutic Level Labs: ?No results found for: LITHIUM ?No results found for: VALPROATE ?No components found for:  CBMZ ? ?Current Medications: ?Current Outpatient Medications  ?Medication Sig Dispense Refill  ? buPROPion (WELLBUTRIN XL) 150 MG 24 hr tablet Take 3 tablets (450 mg total) by mouth daily. 270 tablet 0  ? escitalopram (LEXAPRO) 10 MG tablet  Take 1 tablet (10 mg total) by mouth daily. Total of 15 mg daily. Take along with 5 mg tab 90 tablet 0  ? escitalopram (LEXAPRO) 5 MG tablet Take 1 tablet (5 mg total) by mouth daily. Total of 15 mg daily. Take along with 10 mg tab 30 tablet 1  ? ?No current facility-administered medications for this visit.  ? ? ? ?Musculoskeletal: ?Strength & Muscle Tone:  normal ?Gait & Station: normal ?Patient leans: N/A ? ?Psychiatric Specialty Exam: ?Review of Systems  ?There were no vitals taken for this visit.There is no height or weight on file to calculate BMI.  ?General Appearance: {Appearance:22683}  ?Eye Contact:  {BHH EYE CONTACT:22684}  ?Speech:  Clear and Coherent  ?Volume:  Normal  ?Mood:  {BHH MOOD:22306}  ?Affect:  {Affect (PAA):22687}  ?Thought Process:  Coherent   ?Orientation:  Full (Time, Place, and Person)  ?Thought Content: Logical   ?Suicidal Thoughts:  {ST/HT (PAA):22692}  ?Homicidal Thoughts:  {ST/HT (PAA):22692}  ?Memory:  Immediate;   Good  ?Judgement:  {Judgement (PAA):22694}  ?Insight:  {Insight (PAA):22695}  ?Psychomotor Activity:  Normal  ?Concentration:  Concentration: Good and Attention Span: Good  ?Recall:  Good  ?Fund of Knowledge: Good  ?Language: Good  ?Akathisia:  No  ?Handed:  Right  ?AIMS (if indicated): not done  ?Assets:  Communication Skills ?Desire for Improvement  ?ADL's:  Intact  ?Cognition: WNL  ?Sleep:  {BHH GOOD/FAIR/POOR:22877}  ? ?Screenings: ?GAD-7   ? ?Flowsheet Row Office Visit from 12/21/2019 in [facility]  ?Total GAD-7 Score 5  ? ?  ? ?PHQ2-9   ? ?Flowsheet Row Video Visit from 04/15/2021 in [facility] Counselor from 03/31/2021 in [facility] Video Visit from 03/06/2021 in [facility] Video Visit from 10/08/2020 in [facility] Video Visit from 09/10/2020 in [facility]  ?PHQ-2 Total Score 2 2 2  0 1  ?PHQ-9 Total Score 6 10 10  -- --  ? ?  ? ?Flowsheet Row Admission (Discharged) from 04/21/2021 in [facility] Video Visit from 04/15/2021 in [facility] Counselor from 03/31/2021 in [facility]  ?C-SSRS RISK CATEGORY No Risk No Risk No Risk  ? ?  ? ? ? ?Assessment and Plan:  ?Felicia Martinez is a 21 y.o. year old female with a history of depression, who presents for follow up appointment for below.  ?  ?1. MDD (major depressive disorder), recurrent episode, mild (HCC) ?2. GAD (generalized anxiety disorder) ?# r/o substance induced mood disorder ?Although she reports overall improvement in depressive symptoms since starting Lexapro, she continues to have prominent fatigue with occasional anxiety.  Psychosocial  stressors include financial strain.  Will uptitrate Lexapro to target depression and anxiety.  Will do this up titration slowly given she had a paradoxical reaction of anxiety when she was started on this medication.  Discussed potential risk of SI in younger population.  Will continue bupropion at the current dose as adjunctive treatment for depression.  ?  ?# inattention ?Etiology is multifactorial given her mood symptoms, and CBD use. She has had a good benefit from Concerta in the past, although it has been on hold given she has been able to manage things well.  Will consider restarting this medication in the future if any worsening.   ?  ?# Weed/CBD use ?She is motivated to cut down CBD use, which she uses for pain.  She has not used weed  since the last visit.  Will continue motivational interview.  ?  ?3. Insomnia, unspecified type ?Referral was made for evaluation of sleep apnea.  She has an upcoming appointment for this.  ?  ?  ?Plan ?Continue bupropion 450 mg daily ?Increase lexapro 15 mg daily  ?Next appointment: 3/23 at 8 AM for 30 mins, in person ?  ?  ?This clinician has discussed the side effect associated with medication prescribed during this encounter. Please refer to notes in the previous encounters for more details.   ?  ?Past trials of medication: Abilify (weight gain) Trazodone, melatonin (felt sad) ?  ?The patient demonstrates the following risk factors for suicide: Chronic risk factors for suicide include: psychiatric disorder of depression. Acute risk factors for suicide include: N/A. Protective factors for this patient include: positive social support, coping skills and hope for the future. Considering these factors, the overall suicide risk at this point appears to be low. Patient is appropriate for outpatient follow up ?  ? ? ?Collaboration of Care: Collaboration of Care: {BH OP Collaboration of WCBJ:[id number]} ? ?Patient/Guardian was advised Release of Information must be obtained prior to  any record release in order to collaborate their care with an outside provider. Patient/Guardian was advised if they have not already done so to contact the registration department to sign all necessary forms

## 2021-09-18 ENCOUNTER — Ambulatory Visit: Payer: Medicaid Other | Admitting: Psychiatry

## 2021-09-22 ENCOUNTER — Ambulatory Visit (INDEPENDENT_AMBULATORY_CARE_PROVIDER_SITE_OTHER): Payer: Medicaid Other | Admitting: Internal Medicine

## 2021-09-22 ENCOUNTER — Other Ambulatory Visit: Payer: Self-pay | Admitting: *Deleted

## 2021-09-22 ENCOUNTER — Other Ambulatory Visit: Payer: Self-pay

## 2021-09-22 DIAGNOSIS — R051 Acute cough: Secondary | ICD-10-CM

## 2021-09-22 LAB — PULMONARY FUNCTION TEST
DL/VA % pred: 101 %
DL/VA: 4.95 ml/min/mmHg/L
DLCO cor % pred: 128 %
DLCO cor: 25.51 ml/min/mmHg
DLCO unc % pred: 128 %
DLCO unc: 25.51 ml/min/mmHg
RV % pred: 116 %
RV: 1.23 L
TLC % pred: 120 %
TLC: 5.56 L

## 2021-09-22 NOTE — Patient Instructions (Signed)
Diffusion capacity and lung volumes performed today. ?

## 2021-09-22 NOTE — Progress Notes (Signed)
Patient was not able to complete spirometry without error. DLCO and Pleth performed. ?

## 2021-09-22 NOTE — Progress Notes (Signed)
Ft

## 2021-09-23 NOTE — Progress Notes (Signed)
Patient was unable to do complete full spirometry. Overinflation and increased diffusion capacity. Can be seen wit asthma. Needs to follow-up in office after sleep study if not already done

## 2021-10-02 NOTE — Progress Notes (Deleted)
BH MD/PA/NP OP Progress Note ? ?10/02/2021 5:44 PM ?Felicia Martinez  ?MRN:  [id number] ? ?Chief Complaint: No chief complaint on file. ? ?HPI: *** ?Visit Diagnosis: No diagnosis found. ? ?Past Psychiatric History: Please see initial evaluation for full details. I have reviewed the history. No updates at this time.  ?  ? ?Past Medical History:  ?Past Medical History:  ?Diagnosis Date  ? Allergy   ? Asthma   ? Morbid obesity (HCC)   ? 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  ?Past Surgical History:  ?Procedure Laterality Date  ? CHOLECYSTECTOMY N/A 04/21/2021  ? Procedure: LAPAROSCOPIC CHOLECYSTECTOMY;  Surgeon: Franky Macho, MD;  Location: [facility];  Service: General;  Laterality: N/A;  ? ? ?Family Psychiatric History: Please see initial evaluation for full details. I have reviewed the history. No updates at this time.  ?  ? ?Family History:  ?Family History  ?Problem Relation Age of Onset  ? Hypertension Mother   ? Anxiety disorder Mother   ? Depression Mother   ? Lung cancer Maternal Grandmother   ? Schizophrenia Maternal Aunt   ? ? ?Social History:  ?Social History  ? ?Socioeconomic History  ? Marital status: Single  ?  Spouse name: Not on file  ? Number of children: Not on file  ? Years of education: Not on file  ? Highest education level: Not on file  ?Occupational History  ? Not on file  ?Tobacco Use  ? Smoking status: Never  ? Smokeless tobacco: Never  ?Vaping Use  ? Vaping Use: Never used  ?Substance and Sexual Activity  ? Alcohol use: Yes  ?  Comment: occasional  ? Drug use: Yes  ?  Types: Marijuana  ? Sexual activity: Yes  ?  Birth control/protection: Condom  ?  Comment: menarch age 43.  Lives with mom.  Parents separated.  ?Other Topics Concern  ? Not on file  ?Social History Narrative  ? In 9th grade at [employer]. She is performing good in school.  No regular exercise.  More than 3 hours of screen time per day.  Enjoys watching netflix, writing poetry and talking to friends.   ? ?Social Determinants of  Health  ? ?Financial Resource Strain: Not on file  ?Food Insecurity: Not on file  ?Transportation Needs: Not on file  ?Physical Activity: Not on file  ?Stress: Not on file  ?Social Connections: Not on file  ? ? ?Allergies:  ?Allergies  ?Allergen Reactions  ? Other   ?  Seasonal Allergies-pollen  ? ? ?Metabolic Disorder Labs: ?Lab Results  ?Component Value Date  ? HGBA1C 5.4 08/06/2020  ? MPG 108 08/06/2020  ? MPG 114 10/18/2019  ? ?Lab Results  ?Component Value Date  ? PROLACTIN 8.8 01/17/2015  ? ?Lab Results  ?Component Value Date  ? CHOL 169 10/18/2019  ? TRIG 153 (H) 10/18/2019  ? HDL 44 (L) 10/18/2019  ? CHOLHDL 3.8 10/18/2019  ? VLDL 35 (H) 08/02/2015  ? LDLCALC 100 10/18/2019  ? LDLCALC 110 (H) 08/02/2015  ? ?Lab Results  ?Component Value Date  ? TSH 2.280 03/17/2021  ? TSH 1.30 03/07/2020  ? ? ?Therapeutic Level Labs: ?No results found for: LITHIUM ?No results found for: VALPROATE ?No components found for:  CBMZ ? ?Current Medications: ?Current Outpatient Medications  ?Medication Sig Dispense Refill  ? buPROPion (WELLBUTRIN XL) 150 MG 24 hr tablet Take 3 tablets (450 mg total) by mouth daily. 270 tablet 0  ? escitalopram (LEXAPRO) 10 MG tablet  Take 1 tablet (10 mg total) by mouth daily. Total of 15 mg daily. Take along with 5 mg tab 90 tablet 0  ? escitalopram (LEXAPRO) 5 MG tablet Take 1 tablet (5 mg total) by mouth daily. Total of 15 mg daily. Take along with 10 mg tab 30 tablet 1  ? ?No current facility-administered medications for this visit.  ? ? ? ?Musculoskeletal: ?Strength & Muscle Tone: within normal limits ?Gait & Station: normal ?Patient leans: N/A ? ?Psychiatric Specialty Exam: ?Review of Systems  ?There were no vitals taken for this visit.There is no height or weight on file to calculate BMI.  ?General Appearance: {Appearance:22683}  ?Eye Contact:  {BHH EYE CONTACT:22684}  ?Speech:  Clear and Coherent  ?Volume:  Normal  ?Mood:  {BHH MOOD:22306}  ?Affect:  {Affect (PAA):22687}  ?Thought Process:   Coherent  ?Orientation:  Full (Time, Place, and Person)  ?Thought Content: Logical   ?Suicidal Thoughts:  {ST/HT (PAA):22692}  ?Homicidal Thoughts:  {ST/HT (PAA):22692}  ?Memory:  Immediate;   Good  ?Judgement:  {Judgement (PAA):22694}  ?Insight:  {Insight (PAA):22695}  ?Psychomotor Activity:  Normal  ?Concentration:  Concentration: Good and Attention Span: Good  ?Recall:  Good  ?Fund of Knowledge: Good  ?Language: Good  ?Akathisia:  No  ?Handed:  Right  ?AIMS (if indicated): not done  ?Assets:  Communication Skills ?Desire for Improvement  ?ADL's:  Intact  ?Cognition: WNL  ?Sleep:  {BHH GOOD/FAIR/POOR:22877}  ? ?Screenings: ?GAD-7   ? ?Flowsheet Row [facility]  ?Total GAD-7 Score 5  ? ?  ? ?PHQ2-9   ? ?Flowsheet Row [facility] from [facility] Video [facility] Video [facility] from [facility]2020 [facility]  ?PHQ-2 Total Score 2 2 2  0 1  ?PHQ-9 Total Score 6 10 10  -- --  ? ?  ? ?Flowsheet Row Admission (Discharged) from [facility]/2021 in [facility] Video Visit from [facility] from [facility]  ?C-SSRS RISK CATEGORY No Risk No Risk No Risk  ? ?  ? ? ? ?Assessment and Plan:  ?Felicia Martinez is a 21 y.o. year old female with a history of depression, who presents for follow up appointment for below.  ? ? ?  ?1. MDD (major depressive disorder), recurrent episode, mild (HCC) ?2. GAD (generalized anxiety disorder) ?# r/o substance induced mood disorder ?Although she reports overall improvement in depressive symptoms since starting Lexapro, she continues to have prominent fatigue with occasional anxiety.   Psychosocial stressors include financial strain.  Will uptitrate Lexapro to target depression and anxiety.  Will do this up titration slowly given she had a paradoxical reaction of anxiety when she was started on this medication.  Discussed potential risk of SI in younger population.  Will continue bupropion at the current dose as adjunctive treatment for depression.  ?  ?# inattention ?Etiology is multifactorial given her mood symptoms, and CBD use. She has had a good benefit from Concerta in the past, although it has been on hold given she has been able to manage things well.  Will consider restarting this medication in the future if any worsening.   ?  ?# Weed/CBD use ?She is motivated to cut down CBD use, which she uses for pain.  She has  not used weed since the last visit.  Will continue motivational interview.  ?  ?3. Insomnia, unspecified type ?Referral was made for evaluation of sleep apnea.  She has an upcoming appointment for this.  ?  ?  ?Plan ?Continue bupropion 450 mg daily ?Increase lexapro 15 mg daily  ?Next appointment: 3/23 at 8 AM for 30 mins, in person ?  ?  ?This clinician has discussed the side effect associated with medication prescribed during this encounter. Please refer to notes in the previous encounters for more details.   ?  ?Past trials of medication: Abilify (weight gain) Trazodone, melatonin (felt sad) ?  ?The patient demonstrates the following risk factors for suicide: Chronic risk factors for suicide include: psychiatric disorder of depression. Acute risk factors for suicide include: N/A. Protective factors for this patient include: positive social support, coping skills and hope for the future. Considering these factors, the overall suicide risk at this point appears to be low. Patient is appropriate for outpatient follow up ?  ?  ? ?  ? ?Collaboration of Care: Collaboration of Care: {BH OP Collaboration of KGYJ:[id number]} ? ?Patient/Guardian was advised Release of Information must  be obtained prior to any record release in order to collaborate their care with an outside provider. Patient/Guardian was advised if they have not already done so to contact the registration department to sig

## 2021-10-06 ENCOUNTER — Ambulatory Visit: Payer: Medicaid Other | Admitting: Psychiatry

## 2021-10-09 ENCOUNTER — Ambulatory Visit (INDEPENDENT_AMBULATORY_CARE_PROVIDER_SITE_OTHER): Payer: Medicaid Other | Admitting: Family Medicine

## 2021-10-09 VITALS — BP 116/82 | HR 75 | Temp 96.3°F | Ht 61.0 in | Wt 177.6 lb

## 2021-10-09 DIAGNOSIS — J45991 Cough variant asthma: Secondary | ICD-10-CM

## 2021-10-09 MED ORDER — MONTELUKAST SODIUM 10 MG PO TABS: 10.0000 mg | ORAL_TABLET | Freq: Every day | ORAL | 3 refills | Status: DC

## 2021-10-09 MED ORDER — ALBUTEROL SULFATE HFA 108 (90 BASE) MCG/ACT IN AERS: 2.0000 | INHALATION_SPRAY | Freq: Four times a day (QID) | RESPIRATORY_TRACT | 0 refills | Status: DC | PRN

## 2021-10-09 NOTE — Progress Notes (Signed)
? ?Subjective:  ? ? Patient ID: Felicia Martinez, female    DOB: Jul 23, 2000, 21 y.o.   MRN: [id number] ? ?HPI ?Patient is a very pleasant 21 year old Hispanic female who presents today for chronic follow-up.  She was seen by pulmonology in February.  They performed a chest x-ray that showed bronchitic changes.  She was sent for pulmonary function test that they were unable to complete but based on the results, there were findings consistent and possibly suggestive of asthma.  She has not followed up with them.  She reports trying her mother's albuterol and seeing some benefit with regards to her coughing spells however she is having coughing spells on a daily basis.  She also occasionally smokes marijuana for anxiety.  She also has allergies including sneezing and rhinorrhea which also seems to be a trigger.  She denies any fevers chills weight loss or hemoptysis ?Past Medical History:  ?Diagnosis Date  ? Allergy   ? Asthma   ? Morbid obesity (HCC)   ? 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 ?Past Surgical History:  ?Procedure Laterality Date  ? CHOLECYSTECTOMY N/A 04/21/2021  ? Procedure: LAPAROSCOPIC CHOLECYSTECTOMY;  Surgeon: Franky Macho, MD;  Location: [facility];  Service: General;  Laterality: N/A;  ? ?Current Outpatient Medications on File Prior to Visit  ?Medication Sig Dispense Refill  ? buPROPion (WELLBUTRIN XL) 150 MG 24 hr tablet Take 3 tablets (450 mg total) by mouth daily. 270 tablet 0  ? escitalopram (LEXAPRO) 10 MG tablet Take 1 tablet (10 mg total) by mouth daily. Total of 15 mg daily. Take along with 5 mg tab 90 tablet 0  ? escitalopram (LEXAPRO) 5 MG tablet Take 1 tablet (5 mg total) by mouth daily. Total of 15 mg daily. Take along with 10 mg tab 30 tablet 1  ? ?No current facility-administered medications on file prior to visit.  ? ?Allergies  ?Allergen Reactions  ? Other   ?  Seasonal Allergies-pollen  ? ?Social History  ? ?Socioeconomic History  ? Marital status: Single  ?  Spouse name: Not on file  ?  Number of children: Not on file  ? Years of education: Not on file  ? Highest education level: Not on file  ?Occupational History  ? Not on file  ?Tobacco Use  ? Smoking status: Never  ? Smokeless tobacco: Never  ?Vaping Use  ? Vaping Use: Never used  ?Substance and Sexual Activity  ? Alcohol use: Yes  ?  Comment: occasional  ? Drug use: Yes  ?  Types: Marijuana  ? Sexual activity: Yes  ?  Birth control/protection: Condom  ?  Comment: menarch age 17.  Lives with mom.  Parents separated.  ?Other Topics Concern  ? Not on file  ?Social History Narrative  ? In 9th grade at [employer]. She is performing good in school.  No regular exercise.  More than 3 hours of screen time per day.  Enjoys watching netflix, writing poetry and talking to friends.   ? ?Social Determinants of Health  ? ?Financial Resource Strain: Not on file  ?Food Insecurity: Not on file  ?Transportation Needs: Not on file  ?Physical Activity: Not on file  ?Stress: Not on file  ?Social Connections: Not on file  ?Intimate Partner Violence: Not on file  ? ? ? ? ?Review of Systems  ?All other systems reviewed and are negative. ? ?   ?Objective:  ? Physical Exam ?Constitutional:   ?   Appearance: Normal appearance. She  is obese.  ?HENT:  ?   Nose: No congestion or rhinorrhea.  ?   Mouth/Throat:  ?   Mouth: Mucous membranes are moist.  ?   Pharynx: Oropharynx is clear. No oropharyngeal exudate or posterior oropharyngeal erythema.  ?Eyes:  ?   Conjunctiva/sclera: Conjunctivae normal.  ?Cardiovascular:  ?   Rate and Rhythm: Normal rate and regular rhythm.  ?   Heart sounds: Normal heart sounds. No murmur heard. ?  No gallop.  ?Pulmonary:  ?   Effort: Pulmonary effort is normal. No respiratory distress.  ?   Breath sounds: Normal breath sounds.  ?Musculoskeletal:  ?   Cervical back: Neck supple.  ?   Right lower leg: No edema.  ?   Left lower leg: No edema.  ?Neurological:  ?   Mental Status: She is alert.  ? ? ? ? ? ?   ?Assessment & Plan:  ?Cough  variant asthma ?Patient has mild intermittent asthma versus cough variant asthma.  I recommended cessation of all smoking.  I recommended trying Singulair 10 mg a day to help manage her allergies and hopefully help control the asthma.  She can use albuterol 2 puffs every 6 hours as needed for wheezing.  If symptoms do not improve, we can next try an inhaled steroid.  Recheck in 2 months or sooner if worsening ? ?

## 2021-11-18 NOTE — Progress Notes (Unsigned)
BH MD/PA/NP OP Progress Note  11/20/2021 5:12 PM Felicia Martinez  MRN:  [id number]  Chief Complaint:  Chief Complaint  Patient presents with   Follow-up   Depression   HPI:  - she was evaluated for cough variant asthma.  This is a follow-up appointment for depression and anxiety.  She apologized for coming late.  She was late leave the house, and was lost on the way here.  She has been forgetful.  She has not been taking bupropion for the past 2 weeks, although she wants to take it.  She was laid off from work as she was not making progress.  She is trying to find another job.  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  Although her mother advises her to get a job to go outside and she thinks it is true, she does not have the motivation to do so.  She feels sad as she cannot understand why it is so difficult for her to do things.  She feels that her brain is always a mess.  She has insomnia to hypersomnia.  She has fluctuation in appetite.  She has depressive symptoms as in PHQ-9.  She denies SI.  She is happy and hopeful for the future, and she denies feeling depressed.  She discontinued Lexapro about a month ago as she was feeling more anxious. She uses CBD every day for anxiety.   She is willing to restart bupropion, using pill box to improve adherence.   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 Hope to become a Runner, broadcasting/film/videoteacher. Took honors in high school [place] "she describes her childhood as "nice childhood though poor." Her parents are from [place], and separated. She has good relationship with both of her parents  Wt Readings from Last 3 Encounters:  11/20/21  179 lb 3.2 oz (81.3 kg)  10/09/21 177 lb 9.6 oz (80.6 kg)  08/22/21 179 lb 3.2 oz (81.3 kg)    Visit Diagnosis:    ICD-10-CM   1. MDD (major depressive disorder), recurrent episode, moderate (HCC)  F33.1     2. GAD (generalized anxiety disorder)  F41.1       Past Psychiatric History: Please see initial evaluation for full details. I have reviewed the history. No updates at this time.     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 I have reviewed the history. No updates at this time.     Family History:  Family History  Problem Relation Age of Onset   Hypertension Mother    Anxiety disorder Mother    Depression Mother    Lung cancer Maternal Grandmother    Schizophrenia Maternal Aunt     Social History:  Social History   Socioeconomic History   Marital status: Single  Spouse name: Not on file   Number of children: Not on file   Years of education: Not on file   Highest education level: Not on file  Occupational History   Not on file  Tobacco Use   Smoking status: Never   Smokeless tobacco: Never  Vaping Use   Vaping Use: Never used  Substance and Sexual Activity   Alcohol use: Yes    Comment: occasional   Drug use: Yes    Types: Marijuana   Sexual activity: Yes    Birth control/protection: Condom    Comment: menarch age 74.  Lives with mom.  Parents separated.  Other Topics Concern   Not on file  Social History Narrative   In 9th grade at [employer]. She is performing good in school.  No regular exercise.  More than 3 hours of screen time per day.  Enjoys  watching netflix, writing poetry and talking to friends.    Social Determinants of Health   Financial Resource Strain: Not on file  Food Insecurity: Not on file  Transportation Needs: Not on file  Physical Activity: Not on file  Stress: Not on file  Social Connections: Not on file    Allergies:  Allergies  Allergen Reactions   Other     Seasonal Allergies-pollen    Metabolic Disorder Labs: Lab Results  Component Value Date   HGBA1C 5.4 08/06/2020   MPG 108 08/06/2020   MPG 114 10/18/2019   Lab Results  Component Value Date   PROLACTIN 8.8 01/17/2015   Lab Results  Component Value Date   CHOL 169 10/18/2019   TRIG 153 (H) 10/18/2019   HDL 44 (L) 10/18/2019   CHOLHDL 3.8 10/18/2019   VLDL 35 (H) 08/02/2015   LDLCALC 100 10/18/2019   LDLCALC 110 (H) 08/02/2015   Lab Results  Component Value Date   TSH 2.280 03/17/2021   TSH 1.30 03/07/2020    Therapeutic Level Labs: No results found for: LITHIUM No results found for: VALPROATE No components found for:  CBMZ  Current Medications: Current Outpatient Medications  Medication Sig Dispense Refill   albuterol (VENTOLIN HFA) 108 (90 Base) MCG/ACT inhaler Inhale 2 puffs into the lungs every 6 (six) hours as needed for wheezing or shortness of breath. 8 g 0   montelukast (SINGULAIR) 10 MG tablet Take 1 tablet (10 mg total) by mouth at bedtime. 30 tablet 3   buPROPion (WELLBUTRIN XL) 150 MG 24 hr tablet Take 3 tablets (450 mg total) by mouth daily. 270 tablet 0   escitalopram (LEXAPRO) 5 MG tablet Take 1 tablet (5 mg total) by mouth daily. Total of 15 mg daily. Take along with 10 mg tab 30 tablet 1   No current facility-administered medications for this visit.     Musculoskeletal: Strength & Muscle Tone:  normal Gait & Station: normal Patient leans: N/A  Psychiatric Specialty Exam: Review of Systems  Psychiatric/Behavioral:  Positive for decreased concentration, dysphoric mood and sleep disturbance. Negative  for agitation, behavioral problems, confusion, hallucinations, self-injury and suicidal ideas. The patient is nervous/anxious. The patient is not hyperactive.   All other systems reviewed and are negative.  Blood pressure 116/76, pulse 60, temperature 98.3 F (36.8 C), temperature source Temporal, weight 179 lb 3.2 oz (81.3 kg).Body mass index is 33.86 kg/m.  General Appearance: Fairly Groomed  Eye Contact:  Good  Speech:  Clear and Coherent  Volume:  Normal  Mood:   sad  Affect:  Appropriate, Congruent, and down  Thought Process:  Coherent  Orientation:  Full (Time, Place, and Person)  Thought Content: Logical   Suicidal Thoughts:  No  Homicidal Thoughts:  No  Memory:  Immediate;   Good  Judgement:  Good  Insight:  Good  Psychomotor Activity:  Normal  Concentration:  Concentration: Good and Attention Span: Good  Recall:  Good  Fund of Knowledge: Good  Language: Good  Akathisia:  No  Handed:  Right  AIMS (if indicated): not done  Assets:  Communication Skills Desire for Improvement  ADL's:  Intact  Cognition: WNL  Sleep:  Poor   Screenings: GAD-7    Flowsheet Row Office Visit from 12/21/2019 in [facility]  Total GAD-7 Score 5      PHQ2-9    Flowsheet Row Office Visit from 11/20/2021 in [facility] Video Visit from 04/15/2021 in [facility] Counselor from 03/31/2021 in [facility] Video Visit from 03/06/2021 in [facility] Video Visit from 10/08/2020 in [facility]  PHQ-2 Total Score 3 2 2 2  0  PHQ-9 Total Score 17 6 10 10  --      Flowsheet Row Office Visit from 11/20/2021 in [facility] Admission (Discharged) from 04/21/2021 in [place] 04/23/2021 PERIOPERATIVE AREA Video Visit from 04/15/2021 in [facility]  C-SSRS RISK CATEGORY Error: Q3, 4, or 5 should not be populated  when Q2 is No No Risk No Risk        Assessment and Plan:  Felicia Martinez is a 21 y.o. year old female with a history of depression, who presents for follow up appointment for below.   1. MDD (major depressive disorder), recurrent episode, moderate (HCC) 2. GAD (generalized anxiety disorder) There has been significant worsening in depressive symptoms and anxiety in the context of non adherence to bupropion.  She self discontinued lexapro due to worsening in her anxiety. Psychosocial stressors include financial strain.and termination of job.  Will restart bupropion to target depression and inattention.  Will consider adding SSRI in the future if she continues to struggle with anxiety.   # inattention Worsening in inattention. Etiology is multifactorial given her mood symptoms, and CBD use.  Although she did have good benefit from Concerta in the past, she had anxiety from this medication.  Will consider pharmacological treatment after the above symptoms are resolved.     # Weed/CBD use She continues to use CBD for anxiety. Will continue motivational interview.    3. Insomnia, unspecified type Referral was made for evaluation of sleep apnea.  She has an upcoming appointment for this.      Plan Restart bupropion 450 mg daily (she declined refill) Hold lexapro (she self discontinued this) Next appointment: 6/26 at 3 PM for 30 mins, in person   This clinician has discussed the side effect associated with medication prescribed during this encounter. Please refer to notes in the previous encounters for more details.     Past trials of medication: lexapro (anxiety), Abilify (weight gain) Trazodone, melatonin (felt sad)   The patient demonstrates the following risk factors for suicide: Chronic risk factors for suicide include: psychiatric disorder of depression. Acute risk factors for suicide include: N/A. Protective factors for this patient include: positive social support, coping skills  and hope for the future. Considering these factors, the overall suicide risk at this point appears to be low. Patient is appropriate for outpatient follow up      Collaboration of Care:  Collaboration of Care: Other N/A  Patient/Guardian was advised Release of Information must be obtained prior to any record release in order to collaborate their care with an outside provider. Patient/Guardian was advised if they have not already done so to contact the registration department to sign all necessary forms in order for [place] to release information regarding their care.   Consent: Patient/Guardian gives verbal consent for treatment and assignment of benefits for services provided during this visit. Patient/Guardian expressed understanding and agreed to proceed.    Neysa Hotter, MD 11/20/2021, 5:12 PM

## 2021-11-20 ENCOUNTER — Ambulatory Visit (INDEPENDENT_AMBULATORY_CARE_PROVIDER_SITE_OTHER): Payer: Medicaid Other | Admitting: Psychiatry

## 2021-11-20 ENCOUNTER — Encounter: Payer: Self-pay | Admitting: Psychiatry

## 2021-11-20 VITALS — BP 116/76 | HR 60 | Temp 98.3°F | Wt 179.2 lb

## 2021-11-20 DIAGNOSIS — F411 Generalized anxiety disorder: Secondary | ICD-10-CM

## 2021-11-20 DIAGNOSIS — F331 Major depressive disorder, recurrent, moderate: Secondary | ICD-10-CM

## 2021-11-20 NOTE — Patient Instructions (Signed)
Restart bupropion 450 mg daily  Hold lexapro  Next appointment: 6/26 at 3 PM, in person

## 2021-12-16 NOTE — Progress Notes (Unsigned)
BH MD/PA/NP OP Progress Note  12/16/2021 5:46 PM Felicia Martinez  MRN:  [id number]  Chief Complaint: No chief complaint on file.  HPI: *** Visit Diagnosis: No diagnosis found.  Past Psychiatric History: Please see initial evaluation for full details. I have reviewed the history. No updates at this time.     Past Medical History:  Past Medical History:  Diagnosis Date   Allergy    Asthma    Morbid obesity (HCC)    Schizophrenia in children     Past Surgical History:  Procedure Laterality Date   CHOLECYSTECTOMY N/A 04/21/2021   Procedure: LAPAROSCOPIC CHOLECYSTECTOMY;  Surgeon: Franky Macho, MD;  Location: [facility];  Service: General;  Laterality: N/A;    Family Psychiatric History: Please see initial evaluation for full details. I have reviewed the history. No updates at this time.     Family History:  Family History  Problem Relation Age of Onset   Hypertension Mother    Anxiety disorder Mother    Depression Mother    Lung cancer Maternal Grandmother    Schizophrenia Maternal Aunt     Social History:  Social History   Socioeconomic History   Marital status: Single    Spouse name: Not on file   Number of children: Not on file   Years of education: Not on file   Highest education level: Not on file  Occupational History   Not on file  Tobacco Use   Smoking status: Never   Smokeless tobacco: Never  Vaping Use   Vaping Use: Never used  Substance and Sexual Activity   Alcohol use: Yes    Comment: occasional   Drug use: Yes    Types: Marijuana   Sexual activity: Yes    Birth control/protection: Condom    Comment: menarch age 56.  Lives with mom.  Parents separated.  Other Topics Concern   Not on file  Social History Narrative   In 9th grade at [employer]. She is performing good in school.  No regular exercise.  More than 3 hours of screen time per day.  Enjoys watching netflix, writing poetry and talking to friends.    Social Determinants of  Health   Financial Resource Strain: Not on file  Food Insecurity: Food Insecurity Present (12/21/2019)   Hunger Vital Sign    Worried About Running Out of Food in the Last Year: Sometimes true    Ran Out of Food in the Last Year: Sometimes true  Transportation Needs: No Transportation Needs (12/21/2019)   PRAPARE - Administrator, Civil Service (Medical): No    Lack of Transportation (Non-Medical): No  Physical Activity: Inactive (12/21/2019)   Exercise Vital Sign    Days of Exercise per Week: 0 days    Minutes of Exercise per Session: 0 min  Stress: Stress Concern Present (12/21/2019)   [employer] of Occupational Health - Occupational Stress Questionnaire    Feeling of Stress : Very much  Social Connections: Socially Isolated (12/21/2019)   Social Connection and Isolation Panel [NHANES]    Frequency of Communication with Friends and Family: Twice a week    Frequency of Social Gatherings with Friends and Family: Once a week    Attends Religious Services: Never    Database administrator or Organizations: No    Attends Banker Meetings: Never    Marital Status: Never married    Allergies:  Allergies  Allergen Reactions   Other  Seasonal Allergies-pollen    Metabolic Disorder Labs: Lab Results  Component Value Date   HGBA1C 5.4 08/06/2020   MPG 108 08/06/2020   MPG 114 10/18/2019   Lab Results  Component Value Date   PROLACTIN 8.8 01/17/2015   Lab Results  Component Value Date   CHOL 169 10/18/2019   TRIG 153 (H) 10/18/2019   HDL 44 (L) 10/18/2019   CHOLHDL 3.8 10/18/2019   VLDL 35 (H) 08/02/2015   LDLCALC 100 10/18/2019   LDLCALC 110 (H) 08/02/2015   Lab Results  Component Value Date   TSH 2.280 03/17/2021   TSH 1.30 03/07/2020    Therapeutic Level Labs: No results found for: "LITHIUM" No results found for: "VALPROATE" No results found for: "CBMZ"  Current Medications: Current Outpatient Medications  Medication Sig  Dispense Refill   albuterol (VENTOLIN HFA) 108 (90 Base) MCG/ACT inhaler Inhale 2 puffs into the lungs every 6 (six) hours as needed for wheezing or shortness of breath. 8 g 0   buPROPion (WELLBUTRIN XL) 150 MG 24 hr tablet Take 3 tablets (450 mg total) by mouth daily. 270 tablet 0   escitalopram (LEXAPRO) 5 MG tablet Take 1 tablet (5 mg total) by mouth daily. Total of 15 mg daily. Take along with 10 mg tab 30 tablet 1   montelukast (SINGULAIR) 10 MG tablet Take 1 tablet (10 mg total) by mouth at bedtime. 30 tablet 3   No current facility-administered medications for this visit.     Musculoskeletal: Strength & Muscle Tone: within normal limits Gait & Station: normal Patient leans: N/A  Psychiatric Specialty Exam: Review of Systems  There were no vitals taken for this visit.There is no height or weight on file to calculate BMI.  General Appearance: {Appearance:22683}  Eye Contact:  {BHH EYE CONTACT:22684}  Speech:  Clear and Coherent  Volume:  Normal  Mood:  {BHH MOOD:22306}  Affect:  {Affect (PAA):22687}  Thought Process:  Coherent  Orientation:  Full (Time, Place, and Person)  Thought Content: Logical   Suicidal Thoughts:  {ST/HT (PAA):22692}  Homicidal Thoughts:  {ST/HT (PAA):22692}  Memory:  Immediate;   Good  Judgement:  {Judgement (PAA):22694}  Insight:  {Insight (PAA):22695}  Psychomotor Activity:  Normal  Concentration:  Concentration: Good and Attention Span: Good  Recall:  Good  Fund of Knowledge: Good  Language: Good  Akathisia:  No  Handed:  Right  AIMS (if indicated): not done  Assets:  Communication Skills Desire for Improvement  ADL's:  Intact  Cognition: WNL  Sleep:  {BHH GOOD/FAIR/POOR:22877}   Screenings: GAD-7    Flowsheet Row Office Visit from 12/21/2019 in [facility]  Total GAD-7 Score 5      PHQ2-9    Flowsheet Row Office Visit from 11/20/2021 in [facility] Video Visit from 04/15/2021 in [facility] Counselor from 03/31/2021 in [facility] Video Visit from 03/06/2021 in [facility] Video Visit from 10/08/2020 in [facility]  PHQ-2 Total Score 3 2 2 2  0  PHQ-9 Total Score 17 6 10 10  --      Flowsheet Row Office Visit from 11/20/2021 in [facility] Admission (Discharged) from 04/21/2021 in [place] 04/23/2021 PERIOPERATIVE AREA Video Visit from 04/15/2021 in [facility]  C-SSRS RISK CATEGORY Error: Q3, 4, or 5 should not be populated when Q2 is No No Risk No Risk        Assessment and Plan:  Lafern Brinkley  Wiederhold is a 21 y.o. year old female with a history of depression, who presents for follow up appointment for below.     1. MDD (major depressive disorder), recurrent episode, moderate (HCC) 2. GAD (generalized anxiety disorder) There has been significant worsening in depressive symptoms and anxiety in the context of non adherence to bupropion.  She self discontinued lexapro due to worsening in her anxiety. Psychosocial stressors include financial strain.and termination of job.  Will restart bupropion to target depression and inattention.  Will consider adding SSRI in the future if she continues to struggle with anxiety.    # inattention Worsening in inattention. Etiology is multifactorial given her mood symptoms, and CBD use.  Although she did have good benefit from Concerta in the past, she had anxiety from this medication.  Will consider pharmacological treatment after the above symptoms are resolved.     # Weed/CBD use She continues to use CBD for anxiety. Will continue motivational interview.    3. Insomnia, unspecified type Referral was made for evaluation of sleep apnea.  She has an upcoming appointment for this.      Plan Restart bupropion 450 mg daily (she declined refill) Hold lexapro (she self discontinued  this) Next appointment: 6/26 at 3 PM for 30 mins, in person   This clinician has discussed the side effect associated with medication prescribed during this encounter. Please refer to notes in the previous encounters for more details.      Past trials of medication: lexapro (anxiety), Abilify (weight gain) Trazodone, melatonin (felt sad)   The patient demonstrates the following risk factors for suicide: Chronic risk factors for suicide include: psychiatric disorder of depression. Acute risk factors for suicide include: N/A. Protective factors for this patient include: positive social support, coping skills and hope for the future. Considering these factors, the overall suicide risk at this point appears to be low. Patient is appropriate for outpatient follow up          Collaboration of Care: Collaboration of Care: [facility] OP Collaboration of [facility]:[id number]}  Patient/Guardian was advised Release of Information must be obtained prior to any record release in order to collaborate their care with an outside provider. Patient/Guardian was advised if they have not already done so to contact the registration department to sign all necessary forms in order for [place] to release information regarding their care.   Consent: Patient/Guardian gives verbal consent for treatment and assignment of benefits for services provided during this visit. Patient/Guardian expressed understanding and agreed to proceed.    Neysa Hotter, MD 12/16/2021, 5:46 PM

## 2021-12-22 ENCOUNTER — Ambulatory Visit (INDEPENDENT_AMBULATORY_CARE_PROVIDER_SITE_OTHER): Payer: Medicaid Other | Admitting: Psychiatry

## 2021-12-22 ENCOUNTER — Encounter: Payer: Self-pay | Admitting: Psychiatry

## 2021-12-22 VITALS — BP 112/78 | HR 79 | Temp 97.6°F | Wt 172.6 lb

## 2021-12-22 DIAGNOSIS — F1220 Cannabis dependence, uncomplicated: Secondary | ICD-10-CM

## 2021-12-22 DIAGNOSIS — F411 Generalized anxiety disorder: Secondary | ICD-10-CM | POA: Diagnosis not present

## 2021-12-22 DIAGNOSIS — F331 Major depressive disorder, recurrent, moderate: Secondary | ICD-10-CM | POA: Diagnosis not present

## 2021-12-22 MED ORDER — BUPROPION HCL ER (XL) 150 MG PO TB24: 450.0000 mg | ORAL_TABLET | Freq: Every day | ORAL | 0 refills | Status: DC

## 2021-12-22 MED ORDER — VENLAFAXINE HCL ER 37.5 MG PO CP24: 37.5000 mg | ORAL_CAPSULE | Freq: Every day | ORAL | 0 refills | Status: DC

## 2021-12-22 MED ORDER — VENLAFAXINE HCL ER 75 MG PO CP24: 75.0000 mg | ORAL_CAPSULE | Freq: Every day | ORAL | 0 refills | Status: DC

## 2021-12-25 ENCOUNTER — Encounter (HOSPITAL_COMMUNITY): Payer: Self-pay | Admitting: *Deleted

## 2021-12-25 ENCOUNTER — Other Ambulatory Visit: Payer: Self-pay

## 2021-12-25 ENCOUNTER — Emergency Department (HOSPITAL_COMMUNITY)
Admission: EM | Admit: 2021-12-25 | Discharge: 2021-12-25 | Disposition: A | Discharge status: Home / Self Care | Payer: Medicaid Other | Attending: Student | Admitting: Student

## 2021-12-25 ENCOUNTER — Telehealth: Payer: Self-pay | Admitting: Psychiatry

## 2021-12-25 DIAGNOSIS — Z7951 Long term (current) use of inhaled steroids: Secondary | ICD-10-CM | POA: Diagnosis not present

## 2021-12-25 DIAGNOSIS — T887XXA Unspecified adverse effect of drug or medicament, initial encounter: Secondary | ICD-10-CM | POA: Insufficient documentation

## 2021-12-25 DIAGNOSIS — R112 Nausea with vomiting, unspecified: Secondary | ICD-10-CM | POA: Insufficient documentation

## 2021-12-25 DIAGNOSIS — J45909 Unspecified asthma, uncomplicated: Secondary | ICD-10-CM | POA: Diagnosis not present

## 2021-12-25 DIAGNOSIS — Z79899 Other long term (current) drug therapy: Secondary | ICD-10-CM | POA: Insufficient documentation

## 2021-12-25 DIAGNOSIS — R441 Visual hallucinations: Secondary | ICD-10-CM | POA: Insufficient documentation

## 2021-12-25 DIAGNOSIS — R61 Generalized hyperhidrosis: Secondary | ICD-10-CM | POA: Insufficient documentation

## 2021-12-25 DIAGNOSIS — T7840XA Allergy, unspecified, initial encounter: Secondary | ICD-10-CM | POA: Insufficient documentation

## 2021-12-25 DIAGNOSIS — T50905A Adverse effect of unspecified drugs, medicaments and biological substances, initial encounter: Secondary | ICD-10-CM | POA: Diagnosis not present

## 2021-12-25 DIAGNOSIS — R000 Tachycardia, unspecified: Secondary | ICD-10-CM | POA: Diagnosis not present

## 2021-12-25 DIAGNOSIS — F419 Anxiety disorder, unspecified: Secondary | ICD-10-CM | POA: Diagnosis not present

## 2021-12-25 LAB — BASIC METABOLIC PANEL
Anion gap: 9 (ref 5–15)
BUN: 9 mg/dL (ref 6–20)
CO2: 23 mmol/L (ref 22–32)
Calcium: 9.4 mg/dL (ref 8.9–10.3)
Chloride: 105 mmol/L (ref 98–111)
Creatinine, Ser: 0.75 mg/dL (ref 0.44–1.00)
GFR, Estimated: >60 mL/min (ref >60)
Glucose, Bld: 117 mg/dL — ABNORMAL HIGH (ref 70–99)
Potassium: 3.5 mmol/L (ref 3.5–5.1)
Sodium: 137 mmol/L (ref 135–145)

## 2021-12-25 LAB — URINALYSIS, ROUTINE W REFLEX MICROSCOPIC
Bilirubin Urine: NEGATIVE
Glucose, UA: NEGATIVE mg/dL
Hgb urine dipstick: NEGATIVE
Ketones, ur: 20 mg/dL — AB
Leukocytes,Ua: NEGATIVE
Nitrite: NEGATIVE
Protein, ur: NEGATIVE mg/dL
Specific Gravity, Urine: 1.02 (ref 1.005–1.030)
pH: 7 (ref 5.0–8.0)

## 2021-12-25 LAB — CBC WITH DIFFERENTIAL/PLATELET
Abs Immature Granulocytes: 0.04 10*3/uL (ref 0.00–0.07)
Basophils Absolute: 0 10*3/uL (ref 0.0–0.1)
Basophils Relative: 0 %
Eosinophils Absolute: 0 10*3/uL (ref 0.0–0.5)
Eosinophils Relative: 0 %
HCT: 38.7 % (ref 36.0–46.0)
Hemoglobin: 13.4 g/dL (ref 12.0–15.0)
Immature Granulocytes: 0 %
Lymphocytes Relative: 12 %
Lymphs Abs: 1.4 10*3/uL (ref 0.7–4.0)
MCH: 29.7 pg (ref 26.0–34.0)
MCHC: 34.6 g/dL (ref 30.0–36.0)
MCV: 85.8 fL (ref 80.0–100.0)
Monocytes Absolute: 0.2 10*3/uL (ref 0.1–1.0)
Monocytes Relative: 2 %
Neutro Abs: 9.8 10*3/uL — ABNORMAL HIGH (ref 1.7–7.7)
Neutrophils Relative %: 86 %
Platelets: 290 10*3/uL (ref 150–400)
RBC: 4.51 MIL/uL (ref 3.87–5.11)
RDW: 12.4 % (ref 11.5–15.5)
WBC: 11.5 10*3/uL — ABNORMAL HIGH (ref 4.0–10.5)
nRBC: 0 % (ref 0.0–0.2)

## 2021-12-25 LAB — RAPID URINE DRUG SCREEN, HOSP PERFORMED
Amphetamines: NOT DETECTED
Barbiturates: NOT DETECTED
Benzodiazepines: NOT DETECTED
Cocaine: NOT DETECTED
Opiates: NOT DETECTED
Tetrahydrocannabinol: POSITIVE — AB

## 2021-12-25 LAB — CK: Total CK: 59 U/L (ref 38–234)

## 2021-12-25 LAB — PREGNANCY, URINE: Preg Test, Ur: NEGATIVE

## 2021-12-25 LAB — TSH: TSH: 0.738 u[IU]/mL (ref 0.350–4.500)

## 2021-12-25 MED ORDER — HYDROXYZINE HCL 25 MG PO TABS: 25.0000 mg | ORAL_TABLET | Freq: Three times a day (TID) | ORAL | 0 refills | Status: DC | PRN

## 2021-12-25 MED ORDER — HYDROXYZINE HCL 25 MG PO TABS
25.0000 mg | ORAL_TABLET | Freq: Once | ORAL | Status: AC
  Administered 2021-12-25: 25 mg via ORAL
  Filled 2021-12-25: qty 1

## 2021-12-25 NOTE — Telephone Encounter (Signed)
Called the patient. She states that she is on the way to the hospital due to vomiting, hallucinations, panic attack after taking one dose of venlafaxine this morning. She agrees to hold venlafaxine, and contact our office for update after she is seen in the hospital/her symptoms resolves. She agrees to contact if she has any questions.

## 2021-12-25 NOTE — Telephone Encounter (Signed)
Patient started taking new medication prescribed, first dose is today. Making her have panic attacks, seeing things, and feels bad. Does not know what to do to handle this.

## 2021-12-25 NOTE — ED Triage Notes (Addendum)
Pt with visual hallucinations today after starting medication Effexor Xr 37.5mg .  states she is seeing faces.  Denies auditory hallucinations. Denies SI or HI.  Emesis x 3 today

## 2021-12-25 NOTE — ED Provider Notes (Signed)
[facility] EMERGENCY DEPARTMENT Provider Note   CSN: [id number] Arrival date & time: 12/25/21  1337     History  Chief Complaint  Patient presents with   Allergic Reaction    Felicia Martinez is a 21 y.o. female with a history of asthma, migraine headache, schizophrenia and depression and anxiety who has been on Wellbutrin and just started taking Effexor today due to her depression, developed multiple symptoms about 3 hours after taking her first Effexor tablet early this morning.  She describes waxing and waning episodes of anxiety, diaphoresis, nausea with vomiting and visual hallucinations, describing seeing very scary female faces in front of her causing her to close her eyes.  She denies auditory hallucinations.  She denies abdominal pain, chest pain, shortness of breath, headache, neck pain or stiffness.  She has found no alleviators for her symptoms.  She has taken no other medications.  She does endorse using CBD multiple times daily, this is a chronic habit which she uses for her anxiety, she has had no change in the product that she uses.  She denies SI/HI.      The history is provided by the patient.       Home Medications Prior to Admission medications   Medication Sig Start Date End Date Taking? Authorizing Provider  albuterol (VENTOLIN HFA) 108 (90 Base) MCG/ACT inhaler Inhale 2 puffs into the lungs every 6 (six) hours as needed for wheezing or shortness of breath. 10/09/21   Donita Brooks, MD  buPROPion (WELLBUTRIN XL) 150 MG 24 hr tablet Take 3 tablets (450 mg total) by mouth daily. 12/22/21 03/22/22  Neysa Hotter, MD  montelukast (SINGULAIR) 10 MG tablet Take 1 tablet (10 mg total) by mouth at bedtime. 10/09/21   Donita Brooks, MD  venlafaxine XR (EFFEXOR-XR) 37.5 MG 24 hr capsule Take 1 capsule (37.5 mg total) by mouth daily with breakfast for 7 days. 12/22/21 12/29/21  Neysa Hotter, MD  venlafaxine XR (EFFEXOR-XR) 75 MG 24 hr capsule Take 1 capsule (75 mg  total) by mouth daily with breakfast. Start after taking 37.5 mg daily for one week 12/29/21 01/28/22  Neysa Hotter, MD      Allergies    Other    Review of Systems   Review of Systems  Constitutional:  Positive for diaphoresis. Negative for fever.  HENT:  Negative for congestion and sore throat.   Eyes: Negative.   Respiratory:  Negative for chest tightness and shortness of breath.   Cardiovascular:  Negative for chest pain.  Gastrointestinal:  Positive for nausea and vomiting. Negative for abdominal pain.  Genitourinary: Negative.   Musculoskeletal:  Negative for arthralgias, joint swelling and neck pain.  Skin: Negative.  Negative for rash and wound.  Neurological:  Negative for dizziness, weakness, light-headedness, numbness and headaches.  Psychiatric/Behavioral:  Positive for hallucinations. The patient is nervous/anxious.   All other systems reviewed and are negative.   Physical Exam Updated Vital Signs BP 100/69 (BP Location: Left Arm)   Pulse 97   Temp 98.4 F (36.9 C) (Oral)   Resp 20   LMP 12/07/2021   SpO2 95%  Physical Exam Vitals and nursing note reviewed.  Constitutional:      Appearance: She is well-developed.  HENT:     Head: Normocephalic and atraumatic.  Eyes:     Conjunctiva/sclera: Conjunctivae normal.  Cardiovascular:     Rate and Rhythm: Normal rate and regular rhythm.     Heart sounds: Normal heart sounds.  Pulmonary:  Effort: Pulmonary effort is normal.     Breath sounds: Normal breath sounds. No wheezing.  Abdominal:     General: Bowel sounds are normal.     Palpations: Abdomen is soft.     Tenderness: There is no abdominal tenderness.  Musculoskeletal:        General: Normal range of motion.     Cervical back: Normal range of motion.  Skin:    General: Skin is warm and dry.  Neurological:     Mental Status: She is alert and oriented to person, place, and time.  Psychiatric:        Attention and Perception: She perceives visual  hallucinations.        Mood and Affect: Mood is anxious.        Speech: Speech normal.        Behavior: Behavior is cooperative.        Thought Content: Thought content is not paranoid. Thought content does not include homicidal or suicidal ideation.        Cognition and Memory: Cognition normal.     ED Results / Procedures / Treatments   Labs (all labs ordered are listed, but only abnormal results are displayed) Labs Reviewed  CBC WITH DIFFERENTIAL/PLATELET - Abnormal; Notable for the following components:      Result Value   WBC 11.5 (*)    Neutro Abs 9.8 (*)    All other components within normal limits  URINALYSIS, ROUTINE W REFLEX MICROSCOPIC - Abnormal; Notable for the following components:   APPearance HAZY (*)    Ketones, ur 20 (*)    All other components within normal limits  BASIC METABOLIC PANEL - Abnormal; Notable for the following components:   Glucose, Bld 117 (*)    All other components within normal limits  RAPID URINE DRUG SCREEN, HOSP PERFORMED - Abnormal; Notable for the following components:   Tetrahydrocannabinol POSITIVE (*)    All other components within normal limits  CK  PREGNANCY, URINE  TSH    EKG None  Radiology No results found.  Procedures Procedures    Medications Ordered in ED Medications  hydrOXYzine (ATARAX) tablet 25 mg (25 mg Oral Given 12/25/21 1730)    ED Course/ Medical Decision Making/ A&P Clinical Course as of 12/25/21 1943  Thu Dec 25, 2021  1937 Recheck of patient, sx free.  No further visual hallucinations, diaphoresis, n/v.  She does still feel anxious but much improved after receiving the atarax.   [JI]    Clinical Course User Index [JI] Victoriano Lain                           Medical Decision Making Patient with acute anxiety along with hallucinations which have resolved, although still has some mild anxiety currently.  Had taken her first dose of Effexor this morning followed by the symptoms.  I am concerned  about her having a reaction to the Effexor, possibly complicated by interaction with Wellbutrin.  This is not appear to be serotonin syndrome.  Her lab work today is reassuring.  She is advised to hold the Effexor until she has been able to talk to her psychiatric provider.  She is being prescribed additional Atarax for home use in the event her anxiety escalates again.  She was felt to be stable at time of discharge.  She lives with her mother who is here at the bedside with patient.  She has no SI/HI.  Amount and/or Complexity of Data Reviewed External Data Reviewed: notes.    Details: Recent psychiatric notes reviewed. Labs: ordered.    Details: Reviewed and negative for acute findings.  Risk Prescription drug management.           Final Clinical Impression(s) / ED Diagnoses Final diagnoses:  Anxiety  Medication side effect, initial encounter    Rx / DC Orders ED Discharge Orders     None         Victoriano Lain 12/25/21 1943    Kommor, Wyn Forster, MD 12/26/21 1327

## 2021-12-25 NOTE — ED Notes (Signed)
Pt extremely anxious and restless at this time. States all sensations such as light and touch are heightened and making her anxiety worse. Pt refusing V/S at this time. Pt provided with guided imagery and mindful meditation.

## 2021-12-25 NOTE — Discharge Instructions (Signed)
Take the medication prescribed if needed for escalation of your symptoms.  Plan to follow-up with your psychiatrist for further management of your medications.  I do not advise you to take any further Effexor until you have spoken with her about this medication.

## 2021-12-26 ENCOUNTER — Telehealth: Payer: Self-pay

## 2021-12-26 NOTE — Telephone Encounter (Signed)
Transition Care Management Unsuccessful Follow-up Telephone Call  Date of discharge and from where:  12/25/2021 from [facility]  Attempts:  1st Attempt  Reason for unsuccessful TCM follow-up call:  Missing or invalid number

## 2021-12-29 NOTE — Progress Notes (Signed)
Virtual Visit via Video Note  I connected with Felicia Martinez on 01/02/22 at  8:30 AM EDT by a video enabled telemedicine application and verified that I am speaking with the correct person using two identifiers.  Location: Patient: home Provider: office Persons participated in the visit- patient, provider    I discussed the limitations of evaluation and management by telemedicine and the availability of in person appointments. The patient expressed understanding and agreed to proceed.     I discussed the assessment and treatment plan with the patient. The patient was provided an opportunity to ask questions and all were answered. The patient agreed with the plan and demonstrated an understanding of the instructions.   The patient was advised to call back or seek an in-person evaluation if the symptoms worsen or if the condition fails to improve as anticipated.  I provided 20 minutes of non-face-to-face time during this encounter.   Neysa Hotter, MD    [facility] MD/PA/NP OP Progress Note  01/02/2022 9:02 AM Felicia Martinez  MRN:  [id number]  Chief Complaint:  Chief Complaint  Patient presents with   Depression   Follow-up   HPI:  This is a follow-up appointment for depression.  She states that she had nausea, followed by vomiting and fever like symptoms a few hours after taking 1 dose of venlafaxine.  Those symptoms subsided after 2 days after discontinuation of this medication.  She finds bupropion to be helpful.  She has been able to do things such as dishes.  However, she tends to procrastinate.  Although there are so many things she wants to do, it is hard for her to do things.  She is very concerned as she will start school in the month, and she does not want to drop out as the last time.  She easily gets distracted, and is unable to complete task.  She tends to get annoyed by stimulation such as music, although she loves certain music.  She has cut down CBD.  She uses it 1-2  times per day.  She thinks her anxiety has become less.  She has decreasing p.o. intake due to financial strain.  She feels less depressed.  She denies SI.  She denies alcohol use or other drug use.  She is willing to try Ritalin at this time.    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 Hope to become a Runner, broadcasting/film/video. Took honors in high school She grew up in [place], "she describes her childhood as "nice childhood though poor." Her parents are from [place] and separated. She has good relationship with both of her    Visit Diagnosis:    ICD-10-CM   1. MDD (major depressive disorder), recurrent episode, mild (HCC)  F33.0     2. GAD (generalized anxiety disorder)  F41.1     3. Marijuana dependence (HCC)  F12.20     4. Attention deficit hyperactivity disorder (ADHD), unspecified ADHD type  F90.9       Past Psychiatric History: Please see initial evaluation for full details. I have reviewed the history. No updates at this time.     Past Medical History:  Past Medical History:  Diagnosis Date   Allergy    Asthma  Morbid obesity (HCC)    Schizophrenia in children     Past Surgical History:  Procedure Laterality Date   CHOLECYSTECTOMY N/A 04/21/2021   Procedure: LAPAROSCOPIC CHOLECYSTECTOMY;  Surgeon: Franky Macho, MD;  Location: [facility];  Service: General;  Laterality: N/A;    Family Psychiatric History: Please see initial evaluation for full details. I have reviewed the history. No updates at this time.     Family History:  Family History  Problem Relation Age of Onset   Hypertension Mother    Anxiety disorder Mother    Depression Mother    Lung cancer Maternal Grandmother     Schizophrenia Maternal Aunt     Social History:  Social History   Socioeconomic History   Marital status: Single    Spouse name: Not on file   Number of children: Not on file   Years of education: Not on file   Highest education level: Not on file  Occupational History   Not on file  Tobacco Use   Smoking status: Never   Smokeless tobacco: Never  Vaping Use   Vaping Use: Never used  Substance and Sexual Activity   Alcohol use: Yes    Comment: occasional   Drug use: Yes    Types: Marijuana   Sexual activity: Yes    Birth control/protection: Condom    Comment: menarch age 31.  Lives with mom.  Parents separated.  Other Topics Concern   Not on file  Social History Narrative   In 9th grade at [employer]. She is performing good in school.  No regular exercise.  More than 3 hours of screen time per day.  Enjoys watching netflix, writing poetry and talking to friends.    Social Determinants of Health   Financial Resource Strain: Not on file  Food Insecurity: Food Insecurity Present (12/21/2019)   Hunger Vital Sign    Worried About Running Out of Food in the Last Year: Sometimes true    Ran Out of Food in the Last Year: Sometimes true  Transportation Needs: No Transportation Needs (12/21/2019)   PRAPARE - Administrator, Civil Service (Medical): No    Lack of Transportation (Non-Medical): No  Physical Activity: Inactive (12/21/2019)   Exercise Vital Sign    Days of Exercise per Week: 0 days    Minutes of Exercise per Session: 0 min  Stress: Stress Concern Present (12/21/2019)   [employer] of Occupational Health - Occupational Stress Questionnaire    Feeling of Stress : Very much  Social Connections: Socially Isolated (12/21/2019)   Social Connection and Isolation Panel [NHANES]    Frequency of Communication with Friends and Family: Twice a week    Frequency of Social Gatherings with Friends and Family: Once a week    Attends Religious Services:  Never    Database administrator or Organizations: No    Attends Banker Meetings: Never    Marital Status: Never married    Allergies:  Allergies  Allergen Reactions   Other     Seasonal Allergies-pollen   Venlafaxine Hcl Nausea And Vomiting    Vomiting, hallucinations, fever-like reactions with concomitant use of bupropion    Metabolic Disorder Labs: Lab Results  Component Value Date   HGBA1C 5.4 08/06/2020   MPG 108 08/06/2020   MPG 114 10/18/2019   Lab Results  Component Value Date   PROLACTIN 8.8 01/17/2015   Lab Results  Component Value Date   CHOL 169 10/18/2019  TRIG 153 (H) 10/18/2019   HDL 44 (L) 10/18/2019   CHOLHDL 3.8 10/18/2019   VLDL 35 (H) 08/02/2015   LDLCALC 100 10/18/2019   LDLCALC 110 (H) 08/02/2015   Lab Results  Component Value Date   TSH 0.738 12/25/2021   TSH 2.280 03/17/2021    Therapeutic Level Labs: No results found for: "LITHIUM" No results found for: "VALPROATE" No results found for: "CBMZ"  Current Medications: Current Outpatient Medications  Medication Sig Dispense Refill   methylphenidate (RITALIN) 5 MG tablet Take 1 tablet (5 mg total) by mouth 2 (two) times daily. 60 tablet 0   albuterol (VENTOLIN HFA) 108 (90 Base) MCG/ACT inhaler Inhale 2 puffs into the lungs every 6 (six) hours as needed for wheezing or shortness of breath. 8 g 0   buPROPion (WELLBUTRIN XL) 150 MG 24 hr tablet Take 3 tablets (450 mg total) by mouth daily. 270 tablet 0   hydrOXYzine (ATARAX) 25 MG tablet Take 1 tablet (25 mg total) by mouth every 8 (eight) hours as needed for anxiety, nausea or vomiting. 12 tablet 0   montelukast (SINGULAIR) 10 MG tablet Take 1 tablet (10 mg total) by mouth at bedtime. 30 tablet 3   No current facility-administered medications for this visit.     Musculoskeletal: Strength & Muscle Tone:  N/A Gait & Station:  N/A Patient leans: N/A  Psychiatric Specialty Exam: Review of Systems  Psychiatric/Behavioral:   Positive for decreased concentration. Negative for agitation, behavioral problems, confusion, dysphoric mood, hallucinations, self-injury, sleep disturbance and suicidal ideas. The patient is nervous/anxious. The patient is not hyperactive.   All other systems reviewed and are negative.   Last menstrual period 12/07/2021.There is no height or weight on file to calculate BMI.  General Appearance: Fairly Groomed  Eye Contact:  Good  Speech:  Clear and Coherent  Volume:  Normal  Mood:   better  Affect:  Appropriate, Congruent, and calm, slightly fatigued  Thought Process:  Coherent and Goal Directed  Orientation:  Full (Time, Place, and Person)  Thought Content: Logical   Suicidal Thoughts:  No  Homicidal Thoughts:  No  Memory:  Immediate;   Good  Judgement:  Good  Insight:  Good  Psychomotor Activity:  Normal  Concentration:  Concentration: Good and Attention Span: Good  Recall:  Good  Fund of Knowledge: Good  Language: Good  Akathisia:  No  Handed:  Right  AIMS (if indicated): not done  Assets:  Communication Skills Desire for Improvement  ADL's:  Intact  Cognition: WNL  Sleep:  Fair   Screenings: GAD-7    Flowsheet Row Office Visit from 12/21/2019 in [facility]  Total GAD-7 Score 5      PHQ2-9    Flowsheet Row Office Visit from 12/22/2021 in [facility] Office Visit from 11/20/2021 in [facility] Video Visit from 04/15/2021 in [facility] Counselor from 03/31/2021 in [facility] Video Visit from 03/06/2021 in [facility]  PHQ-2 Total Score 1 3 2 2 2   PHQ-9 Total Score 15 17 6 10 10       Flowsheet Row ED from 12/25/2021 in [facility] EMERGENCY DEPARTMENT Office Visit from 12/22/2021 in [facility] Office Visit from 11/20/2021 in [facility]  C-SSRS RISK CATEGORY No  Risk Error: Q3, 4, or 5 should not be populated when Q2 is No Error: Q3, 4, or 5 should not be populated when Q2 is No  Assessment and Plan:  OLANDA BOUGHNER is a 21 y.o. year old female with a history of depression , who presents for follow up appointment for below.   1. MDD (major depressive disorder), recurrent episode, mild (HCC) 2. GAD (generalized anxiety disorder) There has been overall improvement in depressive symptoms and anxiety since improvement in adherence in taking bupropion.  She had significant adverse reaction vomiting, fever from venlafaxine, which was subsided after discontinuation of this medication.  Will continue current dose of bupropion to target depression.   3. Marijuana dependence (HCC) She has cut down CBD use, and there has been improvement in anxiety.  Will continue motivational interview.   4. Attention deficit hyperactivity disorder (ADHD), unspecified ADHD type She reports procrastination, and other ADHD symptoms.  Her clinical course is consistent with ADHD, although her mood symptoms and CBD use are partly attributed to inattention.  Will start Ritalin from lowest dose to target ADHD.  Discussed potential risk of headache, palpitation and worsening in anxiety.  She was advised to try 1 dose only first especially given her recent reaction to venlafaxine.      3. Insomnia, unspecified type Referral was made for evaluation of sleep apnea.  She has an upcoming appointment for this.      Plan Continue bupropion 450 mg daily  Hold venlafaxine Start Ritalin 5 mg twice a day Next appointment: 7/21 at 10 AM for 30 mins, video. 7/31 at 3 PM for 30 mins, in person.  She verbalized understanding that she cannot be seen if she is late for the appointment again.    This clinician has discussed the side effect associated with medication prescribed during this encounter. Please refer to notes in the previous encounters for more details.      Past trials of  medication: sertraline, lexapro (anxiety), Concerta, Abilify (weight gain) Trazodone, melatonin (felt sad)   The patient demonstrates the following risk factors for suicide: Chronic risk factors for suicide include: psychiatric disorder of depression. Acute risk factors for suicide include: N/A. Protective factors for this patient include: positive social support, coping skills and hope for the future. Considering these factors, the overall suicide risk at this point appears to be low. Patient is appropriate for outpatient follow up      Collaboration of Care: Collaboration of Care: Other review notes  Patient/Guardian was advised Release of Information must be obtained prior to any record release in order to collaborate their care with an outside provider. Patient/Guardian was advised if they have not already done so to contact the registration department to sign all necessary forms in order for [place] to release information regarding their care.   Consent: Patient/Guardian gives verbal consent for treatment and assignment of benefits for services provided during this visit. Patient/Guardian expressed understanding and agreed to proceed.   I have utilized the [place] Controlled Substances Reporting System (PMP AWARxE) to confirm adherence regarding the patient's medication. My review reveals appropriate prescription fills.   This clinician has discussed the side effect associated with medication prescribed during this encounter. Please refer to notes in the previous encounters for more details.    Neysa Hotter, MD 01/02/2022, 9:02 AM

## 2022-01-02 ENCOUNTER — Telehealth (INDEPENDENT_AMBULATORY_CARE_PROVIDER_SITE_OTHER): Payer: Medicaid Other | Admitting: Psychiatry

## 2022-01-02 ENCOUNTER — Encounter: Payer: Self-pay | Admitting: Psychiatry

## 2022-01-02 DIAGNOSIS — F909 Attention-deficit hyperactivity disorder, unspecified type: Secondary | ICD-10-CM

## 2022-01-02 DIAGNOSIS — F411 Generalized anxiety disorder: Secondary | ICD-10-CM | POA: Diagnosis not present

## 2022-01-02 DIAGNOSIS — F1220 Cannabis dependence, uncomplicated: Secondary | ICD-10-CM | POA: Diagnosis not present

## 2022-01-02 DIAGNOSIS — F330 Major depressive disorder, recurrent, mild: Secondary | ICD-10-CM

## 2022-01-02 MED ORDER — METHYLPHENIDATE HCL 5 MG PO TABS: 5.0000 mg | ORAL_TABLET | Freq: Two times a day (BID) | ORAL | 0 refills | Status: DC

## 2022-01-02 NOTE — Patient Instructions (Signed)
Continue bupropion 450 mg daily  Hold venlafaxine Start Ritalin 5 mg twice a day Next appointment: 7/21 at 10 AM, video

## 2022-01-06 NOTE — Telephone Encounter (Signed)
Video Visit Completed with Provider

## 2022-01-14 ENCOUNTER — Other Ambulatory Visit: Payer: Self-pay | Admitting: Psychiatry

## 2022-01-14 ENCOUNTER — Telehealth: Payer: Self-pay | Admitting: Psychiatry

## 2022-01-14 MED ORDER — HYDROXYZINE HCL 25 MG PO TABS: 25.0000 mg | ORAL_TABLET | Freq: Every day | ORAL | 0 refills | Status: DC | PRN

## 2022-01-14 NOTE — Telephone Encounter (Signed)
Ordered

## 2022-01-14 NOTE — Telephone Encounter (Signed)
Patient called stating she received medication from hospital, hydroxyzine, 25 mg. She has run out and is asking for a refill. Stated you had mentioned you would refill if she wanted more.

## 2022-01-14 NOTE — Progress Notes (Deleted)
BH MD/PA/NP OP Progress Note  01/14/2022 5:07 PM Felicia Martinez  MRN:  [id number]  Chief Complaint: No chief complaint on file.  HPI: *** Visit Diagnosis: No diagnosis found.  Past Psychiatric History: Please see initial evaluation for full details. I have reviewed the history. No updates at this time.     Past Medical History:  Past Medical History:  Diagnosis Date   Allergy    Asthma    Morbid obesity (HCC)    Schizophrenia in children     Past Surgical History:  Procedure Laterality Date   CHOLECYSTECTOMY N/A 04/21/2021   Procedure: LAPAROSCOPIC CHOLECYSTECTOMY;  Surgeon: Franky Macho, MD;  Location: [facility];  Service: General;  Laterality: N/A;    Family Psychiatric History: Please see initial evaluation for full details. I have reviewed the history. No updates at this time.     Family History:  Family History  Problem Relation Age of Onset   Hypertension Mother    Anxiety disorder Mother    Depression Mother    Lung cancer Maternal Grandmother    Schizophrenia Maternal Aunt     Social History:  Social History   Socioeconomic History   Marital status: Single    Spouse name: Not on file   Number of children: Not on file   Years of education: Not on file   Highest education level: Not on file  Occupational History   Not on file  Tobacco Use   Smoking status: Never   Smokeless tobacco: Never  Vaping Use   Vaping Use: Never used  Substance and Sexual Activity   Alcohol use: Yes    Comment: occasional   Drug use: Yes    Types: Marijuana   Sexual activity: Yes    Birth control/protection: Condom    Comment: menarch age 21.  Lives with mom.  Parents separated.  Other Topics Concern   Not on file  Social History Narrative   In 21th grade at [employer]. She is performing good in school.  No regular exercise.  More than 3 hours of screen time per day.  Enjoys watching netflix, writing poetry and talking to friends.    Social Determinants of  Health   Financial Resource Strain: Not on file  Food Insecurity: Food Insecurity Present (12/21/2019)   Hunger Vital Sign    Worried About Running Out of Food in the Last Year: Sometimes true    Ran Out of Food in the Last Year: Sometimes true  Transportation Needs: No Transportation Needs (12/21/2019)   PRAPARE - Administrator, Civil Service (Medical): No    Lack of Transportation (Non-Medical): No  Physical Activity: Inactive (12/21/2019)   Exercise Vital Sign    Days of Exercise per Week: 0 days    Minutes of Exercise per Session: 0 min  Stress: Stress Concern Present (12/21/2019)   [employer] of Occupational Health - Occupational Stress Questionnaire    Feeling of Stress : Very much  Social Connections: Socially Isolated (12/21/2019)   Social Connection and Isolation Panel [NHANES]    Frequency of Communication with Friends and Family: Twice a week    Frequency of Social Gatherings with Friends and Family: Once a week    Attends Religious Services: Never    Database administrator or Organizations: No    Attends Banker Meetings: Never    Marital Status: Never married    Allergies:  Allergies  Allergen Reactions   Other  Seasonal Allergies-pollen   Venlafaxine Hcl Nausea And Vomiting    Vomiting, hallucinations, fever-like reactions with concomitant use of bupropion    Metabolic Disorder Labs: Lab Results  Component Value Date   HGBA1C 5.4 08/06/2020   MPG 108 08/06/2020   MPG 114 10/18/2019   Lab Results  Component Value Date   PROLACTIN 8.8 01/17/2015   Lab Results  Component Value Date   CHOL 169 10/18/2019   TRIG 153 (H) 10/18/2019   HDL 44 (L) 10/18/2019   CHOLHDL 3.8 10/18/2019   VLDL 35 (H) 08/02/2015   LDLCALC 100 10/18/2019   LDLCALC 110 (H) 08/02/2015   Lab Results  Component Value Date   TSH 0.738 12/25/2021   TSH 2.280 03/17/2021    Therapeutic Level Labs: No results found for: "LITHIUM" No results found  for: "VALPROATE" No results found for: "CBMZ"  Current Medications: Current Outpatient Medications  Medication Sig Dispense Refill   albuterol (VENTOLIN HFA) 108 (90 Base) MCG/ACT inhaler Inhale 2 puffs into the lungs every 6 (six) hours as needed for wheezing or shortness of breath. 8 g 0   buPROPion (WELLBUTRIN XL) 150 MG 24 hr tablet Take 3 tablets (450 mg total) by mouth daily. 270 tablet 0   hydrOXYzine (ATARAX) 25 MG tablet Take 1 tablet (25 mg total) by mouth daily as needed for anxiety, nausea or vomiting. 30 tablet 0   methylphenidate (RITALIN) 5 MG tablet Take 1 tablet (5 mg total) by mouth 2 (two) times daily. 60 tablet 0   montelukast (SINGULAIR) 10 MG tablet Take 1 tablet (10 mg total) by mouth at bedtime. 30 tablet 3   No current facility-administered medications for this visit.     Musculoskeletal: Strength & Muscle Tone:  N/A Gait & Station:  N/A Patient leans: N/A  Psychiatric Specialty Exam: Review of Systems  Last menstrual period 12/07/2021.There is no height or weight on file to calculate BMI.  General Appearance: {Appearance:22683}  Eye Contact:  {BHH EYE CONTACT:22684}  Speech:  Clear and Coherent  Volume:  Normal  Mood:  {BHH MOOD:22306}  Affect:  {Affect (PAA):22687}  Thought Process:  Coherent  Orientation:  Full (Time, Place, and Person)  Thought Content: Logical   Suicidal Thoughts:  {ST/HT (PAA):22692}  Homicidal Thoughts:  {ST/HT (PAA):22692}  Memory:  Immediate;   Good  Judgement:  {Judgement (PAA):22694}  Insight:  {Insight (PAA):22695}  Psychomotor Activity:  Normal  Concentration:  Concentration: Good and Attention Span: Good  Recall:  Good  Fund of Knowledge: Good  Language: Good  Akathisia:  No  Handed:  Right  AIMS (if indicated): not done  Assets:  Communication Skills Desire for Improvement  ADL's:  Intact  Cognition: WNL  Sleep:  {BHH GOOD/FAIR/POOR:22877}   Screenings: GAD-7    Flowsheet Row Office Visit from 12/21/2019 in  [facility]  Total GAD-7 Score 5      PHQ2-9    Flowsheet Row Office Visit from 12/22/2021 in [facility] Office Visit from 11/20/2021 in [facility] Video Visit from 04/15/2021 in [facility] Counselor from 03/31/2021 in [facility] Video Visit from 03/06/2021 in [facility]  PHQ-2 Total Score 1 3 2 2 2   PHQ-9 Total Score 15 17 6 10 10       Flowsheet Row ED from 12/25/2021 in [facility] EMERGENCY DEPARTMENT Office Visit from 12/22/2021 in [facility] Office Visit from 11/20/2021 in [facility]  C-SSRS  RISK CATEGORY No Risk Error: Q3, 4, or 5 should not be populated when Q2 is No Error: Q3, 4, or 5 should not be populated when Q2 is No        Assessment and Plan:  Felicia Martinez is a 21 y.o. year old female with a history of  depression, who presents for follow up appointment for below.     1. MDD (major depressive disorder), recurrent episode, mild (HCC) 2. GAD (generalized anxiety disorder) There has been overall improvement in depressive symptoms and anxiety since improvement in adherence in taking bupropion.  She had significant adverse reaction vomiting, fever from venlafaxine, which was subsided after discontinuation of this medication.  Will continue current dose of bupropion to target depression.    3. Marijuana dependence (HCC) She has cut down CBD use, and there has been improvement in anxiety.  Will continue motivational interview.    4. Attention deficit hyperactivity disorder (ADHD), unspecified ADHD type She reports procrastination, and other ADHD symptoms.  Her clinical course is consistent with ADHD, although her mood symptoms and CBD use are partly attributed to inattention.  Will start Ritalin from lowest dose to target ADHD.  Discussed potential risk of  headache, palpitation and worsening in anxiety.  She was advised to try 1 dose only first especially given her recent reaction to venlafaxine.      3. Insomnia, unspecified type Referral was made for evaluation of sleep apnea.  She has an upcoming appointment for this.      Plan Continue bupropion 450 mg daily  Hold venlafaxine Start Ritalin 5 mg twice a day Next appointment: 7/21 at 10 AM for 30 mins, video. 7/31 at 3 PM for 30 mins, in person.  She verbalized understanding that she cannot be seen if she is late for the appointment again.    This clinician has discussed the side effect associated with medication prescribed during this encounter. Please refer to notes in the previous encounters for more details.      Past trials of medication: sertraline, lexapro (anxiety), Concerta, Abilify (weight gain) Trazodone, melatonin (felt sad)   The patient demonstrates the following risk factors for suicide: Chronic risk factors for suicide include: psychiatric disorder of depression. Acute risk factors for suicide include: N/A. Protective factors for this patient include: positive social support, coping skills and hope for the future. Considering these factors, the overall suicide risk at this point appears to be low. Patient is appropriate for outpatient follow up          Collaboration of Care: Collaboration of Care: [facility] OP Collaboration of NTIR:[id number]}  Patient/Guardian was advised Release of Information must be obtained prior to any record release in order to collaborate their care with an outside provider. Patient/Guardian was advised if they have not already done so to contact the registration department to sign all necessary forms in order for [place] to release information regarding their care.   Consent: Patient/Guardian gives verbal consent for treatment and assignment of benefits for services provided during this visit. Patient/Guardian expressed understanding and agreed to proceed.     Neysa Hotter, MD 01/14/2022, 5:07 PM

## 2022-01-16 ENCOUNTER — Telehealth: Payer: Medicaid Other | Admitting: Psychiatry

## 2022-01-16 ENCOUNTER — Telehealth: Payer: Self-pay | Admitting: Psychiatry

## 2022-01-16 NOTE — Telephone Encounter (Signed)
Sent link for video visit through Epic. Patient did not sign in. Called the patient for appointment scheduled today. The patient did not answer the phone. Left voice message to contact the office ([phone]).   ?

## 2022-01-22 NOTE — Progress Notes (Signed)
BH MD/PA/NP OP Progress Note  01/26/2022 4:51 PM Felicia Martinez  MRN:  [id number]  Chief Complaint:  Chief Complaint  Patient presents with   Follow-up   HPI:  This is a follow-up appointment for ADHD and depression.  She states that she has been doing better since the last visit.  She feels normal, doing things.  She is hoping to sign up for college soon.  She thinks she has been able to do things more easily.  She is much better in organization, although she struggles at times.  She does not feel depressed anymore.  Although she did not want to even brush teeth or make up, she has been able to do things well.  She feels anxious, and takes hydroxyzine every day to prevent severe panic attack.  She has been handling things well this way.  She has fair sleep.  She has decrease in appetite, and reports decrease in p.o. intake partly due to financial strain.  She is not concerned about weight loss, although she will try to eat healthy diet.  She denies SI.  She has cut down using CBD; she uses this every other day.  She agrees to be followed by her PCP for hypertension.  She feels comfortable to stay on the current medication regimen at this time.   Wt Readings from Last 3 Encounters:  01/26/22 162 lb 12.8 oz (73.8 kg)  12/22/21 172 lb 9.6 oz (78.3 kg)  11/20/21 179 lb 3.2 oz (81.3 kg)        Visit Diagnosis:    ICD-10-CM   1. MDD (major depressive disorder), recurrent episode, mild (HCC)  F33.0     2. GAD (generalized anxiety disorder)  F41.1     3. Marijuana dependence (HCC)  F12.20     4. Attention deficit hyperactivity disorder (ADHD), unspecified ADHD type  F90.9     5. Insomnia, unspecified type  G47.00       Past Psychiatric History: Please see initial evaluation for full details. I have reviewed the history. No updates at this time.    Past Medical History:  Past Medical History:  Diagnosis Date   Allergy    Asthma    Morbid obesity (HCC)    Schizophrenia in  children     Past Surgical History:  Procedure Laterality Date   CHOLECYSTECTOMY N/A 04/21/2021   Procedure: LAPAROSCOPIC CHOLECYSTECTOMY;  Surgeon: Franky Macho, MD;  Location: [facility];  Service: General;  Laterality: N/A;    Family Psychiatric History: Please see initial evaluation for full details. I have reviewed the history. No updates at this time.     Family History:  Family History  Problem Relation Age of Onset   Hypertension Mother    Anxiety disorder Mother    Depression Mother    Lung cancer Maternal Grandmother    Schizophrenia Maternal Aunt     Social History:  Social History   Socioeconomic History   Marital status: Single    Spouse name: Not on file   Number of children: Not on file   Years of education: Not on file   Highest education level: Not on file  Occupational History   Not on file  Tobacco Use   Smoking status: Never   Smokeless tobacco: Never  Vaping Use   Vaping Use: Some days   Substances: CBD  Substance and Sexual Activity   Alcohol use: Yes    Comment: occasional   Drug use: Yes    Types: Marijuana  Sexual activity: Yes    Birth control/protection: Condom    Comment: menarch age 34.  Lives with mom.  Parents separated.  Other Topics Concern   Not on file  Social History Narrative   In 9th grade at [employer]. She is performing good in school.  No regular exercise.  More than 3 hours of screen time per day.  Enjoys watching netflix, writing poetry and talking to friends.    Social Determinants of Health   Financial Resource Strain: Not on file  Food Insecurity: Food Insecurity Present (12/21/2019)   Hunger Vital Sign    Worried About Running Out of Food in the Last Year: Sometimes true    Ran Out of Food in the Last Year: Sometimes true  Transportation Needs: No Transportation Needs (12/21/2019)   PRAPARE - Administrator, Civil Service (Medical): No    Lack of Transportation (Non-Medical): No  Physical  Activity: Inactive (12/21/2019)   Exercise Vital Sign    Days of Exercise per Week: 0 days    Minutes of Exercise per Session: 0 min  Stress: Stress Concern Present (12/21/2019)   [employer] of Occupational Health - Occupational Stress Questionnaire    Feeling of Stress : Very much  Social Connections: Socially Isolated (12/21/2019)   Social Connection and Isolation Panel [NHANES]    Frequency of Communication with Friends and Family: Twice a week    Frequency of Social Gatherings with Friends and Family: Once a week    Attends Religious Services: Never    Database administrator or Organizations: No    Attends Banker Meetings: Never    Marital Status: Never married    Allergies:  Allergies  Allergen Reactions   Other     Seasonal Allergies-pollen   Venlafaxine Hcl Nausea And Vomiting    Vomiting, hallucinations, fever-like reactions with concomitant use of bupropion    Metabolic Disorder Labs: Lab Results  Component Value Date   HGBA1C 5.4 08/06/2020   MPG 108 08/06/2020   MPG 114 10/18/2019   Lab Results  Component Value Date   PROLACTIN 8.8 01/17/2015   Lab Results  Component Value Date   CHOL 169 10/18/2019   TRIG 153 (H) 10/18/2019   HDL 44 (L) 10/18/2019   CHOLHDL 3.8 10/18/2019   VLDL 35 (H) 08/02/2015   LDLCALC 100 10/18/2019   LDLCALC 110 (H) 08/02/2015   Lab Results  Component Value Date   TSH 0.738 12/25/2021   TSH 2.280 03/17/2021    Therapeutic Level Labs: No results found for: "LITHIUM" No results found for: "VALPROATE" No results found for: "CBMZ"  Current Medications: Current Outpatient Medications  Medication Sig Dispense Refill   albuterol (VENTOLIN HFA) 108 (90 Base) MCG/ACT inhaler Inhale 2 puffs into the lungs every 6 (six) hours as needed for wheezing or shortness of breath. 8 g 0   buPROPion (WELLBUTRIN XL) 150 MG 24 hr tablet Take 3 tablets (450 mg total) by mouth daily. 270 tablet 0   [START ON 03/05/2022]  methylphenidate (RITALIN) 5 MG tablet Take 1 tablet (5 mg total) by mouth 2 (two) times daily. 60 tablet 0   montelukast (SINGULAIR) 10 MG tablet Take 1 tablet (10 mg total) by mouth at bedtime. 30 tablet 3   [START ON 02/14/2022] hydrOXYzine (ATARAX) 25 MG tablet Take 1 tablet (25 mg total) by mouth daily as needed for anxiety, nausea or vomiting. 30 tablet 0   [START ON 02/02/2022] methylphenidate (RITALIN) 5 MG  tablet Take 1 tablet (5 mg total) by mouth 2 (two) times daily. 60 tablet 0   No current facility-administered medications for this visit.     Musculoskeletal: Strength & Muscle Tone: within normal limits Gait & Station: normal Patient leans: N/A  Psychiatric Specialty Exam: Review of Systems  Psychiatric/Behavioral:  Positive for decreased concentration and sleep disturbance. Negative for agitation, behavioral problems, confusion, dysphoric mood, hallucinations, self-injury and suicidal ideas. The patient is nervous/anxious. The patient is not hyperactive.   All other systems reviewed and are negative.   Blood pressure (!) 142/94, pulse (!) 101, temperature 98.5 F (36.9 C), temperature source Temporal, weight 162 lb 12.8 oz (73.8 kg).Body mass index is 30.76 kg/m.  General Appearance: Fairly Groomed  Eye Contact:  Good  Speech:  Clear and Coherent  Volume:  Normal  Mood:   better  Affect:  Appropriate, Congruent, and euthymic  Thought Process:  Coherent  Orientation:  Full (Time, Place, and Person)  Thought Content: Logical   Suicidal Thoughts:  No  Homicidal Thoughts:  No  Memory:  Immediate;   Good  Judgement:  Good  Insight:  Good  Psychomotor Activity:  Normal  Concentration:  Concentration: Good and Attention Span: Good  Recall:  Good  Fund of Knowledge: Good  Language: Good  Akathisia:  No  Handed:  Right  AIMS (if indicated): not done  Assets:  Communication Skills Desire for Improvement  ADL's:  Intact  Cognition: WNL  Sleep:  Fair    Screenings: GAD-7    Flowsheet Row Office Visit from 01/26/2022 in [facility] Office Visit from 12/21/2019 in [facility]  Total GAD-7 Score 17 5      PHQ2-9    Flowsheet Row Office Visit from 01/26/2022 in [facility] Office Visit from 12/22/2021 in [facility] Office Visit from 11/20/2021 in [facility] Video Visit from 04/15/2021 in [facility] Counselor from 03/31/2021 in [facility]  PHQ-2 Total Score 0 1 3 2 2   PHQ-9 Total Score 8 15 17 6 10       Flowsheet Row Office Visit from 01/26/2022 in [facility] ED from 12/25/2021 in [facility] EMERGENCY DEPARTMENT Office Visit from 12/22/2021 in [facility]  C-SSRS RISK CATEGORY Error: Q3, 4, or 5 should not be populated when Q2 is No No Risk Error: Q3, 4, or 5 should not be populated when Q2 is No        Assessment and Plan:  KINZIE WICKES is a 21 y.o. year old female with a history of depression, who presents for follow up appointment for below.   1. MDD (major depressive disorder), recurrent episode, mild (HCC) 2. GAD (generalized anxiety disorder) There has been overall improvement in depressive symptoms and anxiety since the last visit.  Will continue current dose of bupropion to target depression.  Noted that she has significant adverse reaction of vomiting/fever from venlafaxine.  Will consider judicious use of antidepressant if she were to try in the future.   3. Marijuana dependence (HCC) She has cut down CBD use.  There has been overall improvement in anxiety/appetite.  Will continue motivational interview.   4. Attention deficit hyperactivity disorder (ADHD), unspecified ADHD type There is significant improvement in procrastination and concentration since starting Ritalin.  Will  continue current dose at this time given hypertension/marginal tachycardia on today's evaluation.  She agrees to follow by her PCP.  Will  consider lowering the frequency of Ritalin if this persist.   5. Insomnia, unspecified type Referral was made for evaluation of sleep apnea.  She has an upcoming appointment for this.      Plan Continue bupropion 450 mg daily  Continue Ritalin 5 mg twice a day Next appointment: 9/5 at 1:30 for 30 mins, in person   This clinician has discussed the side effect associated with medication prescribed during this encounter. Please refer to notes in the previous encounters for more details.      Past trials of medication: sertraline, lexapro (anxiety), Concerta, Abilify (weight gain) Trazodone, melatonin (felt sad)   The patient demonstrates the following risk factors for suicide: Chronic risk factors for suicide include: psychiatric disorder of depression. Acute risk factors for suicide include: N/A. Protective factors for this patient include: positive social support, coping skills and hope for the future. Considering these factors, the overall suicide risk at this point appears to be low. Patient is appropriate for outpatient follow up    Patient/Guardian was advised Release of Information must be obtained prior to any record release in order to collaborate their care with an outside provider. Patient/Guardian was advised if they have not already done so to contact the registration department to sign all necessary forms in order for [place] to release information regarding their care.   Consent: Patient/Guardian gives verbal consent for treatment and assignment of benefits for services provided during this visit. Patient/Guardian expressed understanding and agreed to proceed.   This clinician has discussed the side effect associated with medication prescribed during this encounter. Please refer to notes in the previous encounters for more details.    Neysa Hotter,  MD 01/26/2022, 4:51 PM

## 2022-01-26 ENCOUNTER — Ambulatory Visit (INDEPENDENT_AMBULATORY_CARE_PROVIDER_SITE_OTHER): Payer: Medicaid Other | Admitting: Psychiatry

## 2022-01-26 ENCOUNTER — Encounter: Payer: Self-pay | Admitting: Psychiatry

## 2022-01-26 VITALS — BP 142/94 | HR 101 | Temp 98.5°F | Wt 162.8 lb

## 2022-01-26 DIAGNOSIS — F909 Attention-deficit hyperactivity disorder, unspecified type: Secondary | ICD-10-CM

## 2022-01-26 DIAGNOSIS — F330 Major depressive disorder, recurrent, mild: Secondary | ICD-10-CM | POA: Diagnosis not present

## 2022-01-26 DIAGNOSIS — G4700 Insomnia, unspecified: Secondary | ICD-10-CM

## 2022-01-26 DIAGNOSIS — F1220 Cannabis dependence, uncomplicated: Secondary | ICD-10-CM | POA: Diagnosis not present

## 2022-01-26 DIAGNOSIS — F411 Generalized anxiety disorder: Secondary | ICD-10-CM | POA: Diagnosis not present

## 2022-01-26 MED ORDER — METHYLPHENIDATE HCL 5 MG PO TABS: 5.0000 mg | ORAL_TABLET | Freq: Two times a day (BID) | ORAL | 0 refills | Status: DC

## 2022-01-26 MED ORDER — HYDROXYZINE HCL 25 MG PO TABS: 25.0000 mg | ORAL_TABLET | Freq: Every day | ORAL | 0 refills | Status: DC | PRN

## 2022-01-26 MED ORDER — METHYLPHENIDATE HCL 5 MG PO TABS
5.0000 mg | ORAL_TABLET | Freq: Two times a day (BID) | ORAL | 0 refills | Status: DC
Start: 2022-02-02 — End: 2022-04-21

## 2022-02-05 ENCOUNTER — Other Ambulatory Visit: Payer: Self-pay | Admitting: Psychiatry

## 2022-02-13 ENCOUNTER — Other Ambulatory Visit: Payer: Medicaid Other

## 2022-02-16 ENCOUNTER — Other Ambulatory Visit: Payer: Medicaid Other

## 2022-02-16 DIAGNOSIS — E782 Mixed hyperlipidemia: Secondary | ICD-10-CM

## 2022-02-17 ENCOUNTER — Ambulatory Visit (INDEPENDENT_AMBULATORY_CARE_PROVIDER_SITE_OTHER): Payer: Medicaid Other | Admitting: Family Medicine

## 2022-02-17 VITALS — BP 104/56 | HR 91 | Temp 98.9°F | Ht 61.0 in | Wt 159.2 lb

## 2022-02-17 DIAGNOSIS — E782 Mixed hyperlipidemia: Secondary | ICD-10-CM | POA: Diagnosis not present

## 2022-02-17 DIAGNOSIS — Z0000 Encounter for general adult medical examination without abnormal findings: Secondary | ICD-10-CM | POA: Diagnosis not present

## 2022-02-17 LAB — COMPREHENSIVE METABOLIC PANEL
AG Ratio: 2 (calc) (ref 1.0–2.5)
ALT: 9 U/L (ref 6–29)
AST: 13 U/L (ref 10–30)
Albumin: 4.8 g/dL (ref 3.6–5.1)
Alkaline phosphatase (APISO): 61 U/L (ref 31–125)
BUN: 13 mg/dL (ref 7–25)
CO2: 27 mmol/L (ref 20–32)
Calcium: 9.8 mg/dL (ref 8.6–10.2)
Chloride: 102 mmol/L (ref 98–110)
Creat: 0.84 mg/dL (ref 0.50–0.96)
Globulin: 2.4 g/dL (calc) (ref 1.9–3.7)
Glucose, Bld: 92 mg/dL (ref 65–99)
Potassium: 3.8 mmol/L (ref 3.5–5.3)
Sodium: 139 mmol/L (ref 135–146)
Total Bilirubin: 0.5 mg/dL (ref 0.2–1.2)
Total Protein: 7.2 g/dL (ref 6.1–8.1)

## 2022-02-17 LAB — CBC WITH DIFFERENTIAL/PLATELET
Absolute Monocytes: 567 cells/uL (ref 200–950)
Basophils Absolute: 31 cells/uL (ref 0–200)
Basophils Relative: 0.3 %
Eosinophils Absolute: 185 cells/uL (ref 15–500)
Eosinophils Relative: 1.8 %
HCT: 41.4 % (ref 35.0–45.0)
Hemoglobin: 14.3 g/dL (ref 11.7–15.5)
Lymphs Abs: 4481 cells/uL — ABNORMAL HIGH (ref 850–3900)
MCH: 29.7 pg (ref 27.0–33.0)
MCHC: 34.5 g/dL (ref 32.0–36.0)
MCV: 86.1 fL (ref 80.0–100.0)
MPV: 8.6 fL (ref 7.5–12.5)
Monocytes Relative: 5.5 %
Neutro Abs: 5037 cells/uL (ref 1500–7800)
Neutrophils Relative %: 48.9 %
Platelets: 362 10*3/uL (ref 140–400)
RBC: 4.81 10*6/uL (ref 3.80–5.10)
RDW: 12.7 % (ref 11.0–15.0)
Total Lymphocyte: 43.5 %
WBC: 10.3 10*3/uL (ref 3.8–10.8)

## 2022-02-17 LAB — LIPID PANEL
Cholesterol: 230 mg/dL — ABNORMAL HIGH (ref <200)
HDL: 48 mg/dL — ABNORMAL LOW (ref >=50)
LDL Cholesterol (Calc): 147 mg/dL (calc) — ABNORMAL HIGH
Non-HDL Cholesterol (Calc): 182 mg/dL (calc) — ABNORMAL HIGH (ref <130)
Total CHOL/HDL Ratio: 4.8 (calc) (ref <5.0)
Triglycerides: 210 mg/dL — ABNORMAL HIGH (ref <150)

## 2022-02-17 LAB — HEMOGLOBIN A1C
Hgb A1c MFr Bld: 5 % of total Hgb (ref <5.7)
Mean Plasma Glucose: 97 mg/dL
eAG (mmol/L): 5.4 mmol/L

## 2022-02-17 NOTE — Progress Notes (Signed)
Subjective:    Patient ID: Felicia Martinez, female    DOB: 08/28/2000, 21 y.o.   MRN: [id number]  HPI Patient is a 21 year old Hispanic female here today for complete physical exam.  She is scheduled appointment with a gynecologist for Pap smear.  She is not yet due for any other cancer screening.  She denies any medical concerns today other than having frequent diarrhea after having her gallbladder removed.  She has not tried adding any, fiber supplement yet.  She denies any blood in her stool or abdominal pain.  She denies any weight loss to suggest underlying inflammatory bowel disease.  I believe the diarrhea could need to be due to her anxiety or possibly due to her gallbladder being removed so I have suggested trying a fiber supplement first.  Otherwise she is doing well with no concerns.  Her most recent lab work is listed below Appointment on 02/16/2022  Component Date Value Ref Range Status   Hgb A1c MFr Bld 02/16/2022 5.0  <5.7 % of total Hgb Final   Comment: For the purpose of screening for the presence of diabetes: . <5.7%       Consistent with the absence of diabetes 5.7-6.4%    Consistent with increased risk for diabetes             (prediabetes) > or =6.5%  Consistent with diabetes . This assay result is consistent with a decreased risk of diabetes. . Currently, no consensus exists regarding use of hemoglobin A1c for diagnosis of diabetes in children. . According to American Diabetes Association (ADA) guidelines, hemoglobin A1c <7.0% represents optimal control in non-pregnant diabetic patients. Different metrics may apply to specific patient populations.  Standards of Medical Care in Diabetes(ADA). .    Mean Plasma Glucose 02/16/2022 97  mg/dL Final   eAG (mmol/L) 71/11/2692 5.4  mmol/L Final   Glucose, Bld 02/16/2022 92  65 - 99 mg/dL Final   Comment: .            Fasting reference interval .    BUN 02/16/2022 13  7 - 25 mg/dL Final   Creat 85/46/2703 0.84   0.50 - 0.96 mg/dL Final   BUN/Creatinine Ratio 02/16/2022 SEE NOTE:  6 - 22 (calc) Final   Comment:    Not Reported: BUN and Creatinine are within    reference range. .    Sodium 02/16/2022 139  135 - 146 mmol/L Final   Potassium 02/16/2022 3.8  3.5 - 5.3 mmol/L Final   Chloride 02/16/2022 102  98 - 110 mmol/L Final   CO2 02/16/2022 27  20 - 32 mmol/L Final   Calcium 02/16/2022 9.8  8.6 - 10.2 mg/dL Final   Total Protein 50/02/3817 7.2  6.1 - 8.1 g/dL Final   Albumin 29/93/7169 4.8  3.6 - 5.1 g/dL Final   Globulin 67/89/3810 2.4  1.9 - 3.7 g/dL (calc) Final   AG Ratio 02/16/2022 2.0  1.0 - 2.5 (calc) Final   Total Bilirubin 02/16/2022 0.5  0.2 - 1.2 mg/dL Final   Alkaline phosphatase (APISO) 02/16/2022 61  31 - 125 U/L Final   AST 02/16/2022 13  10 - 30 U/L Final   ALT 02/16/2022 9  6 - 29 U/L Final   WBC 02/16/2022 10.3  3.8 - 10.8 Thousand/uL Final   RBC 02/16/2022 4.81  3.80 - 5.10 Million/uL Final   Hemoglobin 02/16/2022 14.3  11.7 - 15.5 g/dL Final   HCT 17/51/0258 41.4  35.0 - 45.0 %  Final   MCV 02/16/2022 86.1  80.0 - 100.0 fL Final   MCH 02/16/2022 29.7  27.0 - 33.0 pg Final   MCHC 02/16/2022 34.5  32.0 - 36.0 g/dL Final   RDW 85/46/2703 12.7  11.0 - 15.0 % Final   Platelets 02/16/2022 362  140 - 400 Thousand/uL Final   MPV 02/16/2022 8.6  7.5 - 12.5 fL Final   Neutro Abs 02/16/2022 5,037  1,500 - 7,800 cells/uL Final   Lymphs Abs 02/16/2022 4,481 (H)  850 - 3,900 cells/uL Final   Absolute Monocytes 02/16/2022 567  200 - 950 cells/uL Final   Eosinophils Absolute 02/16/2022 185  15 - 500 cells/uL Final   Basophils Absolute 02/16/2022 31  0 - 200 cells/uL Final   Neutrophils Relative % 02/16/2022 48.9  % Final   Total Lymphocyte 02/16/2022 43.5  % Final   Monocytes Relative 02/16/2022 5.5  % Final   Eosinophils Relative 02/16/2022 1.8  % Final   Basophils Relative 02/16/2022 0.3  % Final   Cholesterol 02/16/2022 230 (H)  <200 mg/dL Final   HDL 50/02/3817 48 (L)  > OR =  50 mg/dL Final   Triglycerides 29/93/7169 210 (H)  <150 mg/dL Final   Comment: . If a non-fasting specimen was collected, consider repeat triglyceride testing on a fasting specimen if clinically indicated.  Perry Mount et al. J. of Clin. Lipidol. 2015;9:129-169. Marland Kitchen    LDL Cholesterol (Calc) 02/16/2022 147 (H)  mg/dL (calc) Final   Comment: Reference range: <100 . Desirable range <100 mg/dL for primary prevention;   <70 mg/dL for patients with CHD or diabetic patients  with > or = 2 CHD risk factors. Marland Kitchen LDL-C is now calculated using the Martin-Hopkins  calculation, which is a validated novel method providing  better accuracy than the Friedewald equation in the  estimation of LDL-C.  Horald Pollen et al. Lenox Ahr. 6789;381(01): 2061-2068  (http://education.[facility].com/faq/FAQ164)    Total CHOL/HDL Ratio 02/16/2022 4.8  <7.5 (calc) Final   Non-HDL Cholesterol (Calc) 02/16/2022 182 (H)  <130 mg/dL (calc) Final   Comment: For patients with diabetes plus 1 major ASCVD risk  factor, treating to a non-HDL-C goal of <100 mg/dL  (LDL-C of <10 mg/dL) is considered a therapeutic  option.     Past Medical History:  Diagnosis Date   Allergy    Asthma    Morbid obesity (HCC)    Schizophrenia in children    Past Surgical History:  Procedure Laterality Date   CHOLECYSTECTOMY N/A 04/21/2021   Procedure: LAPAROSCOPIC CHOLECYSTECTOMY;  Surgeon: Franky Macho, MD;  Location: [facility];  Service: General;  Laterality: N/A;   Current Outpatient Medications on File Prior to Visit  Medication Sig Dispense Refill   albuterol (VENTOLIN HFA) 108 (90 Base) MCG/ACT inhaler Inhale 2 puffs into the lungs every 6 (six) hours as needed for wheezing or shortness of breath. 8 g 0   buPROPion (WELLBUTRIN XL) 150 MG 24 hr tablet Take 3 tablets (450 mg total) by mouth daily. 270 tablet 0   hydrOXYzine (ATARAX) 25 MG tablet Take 1 tablet (25 mg total) by mouth daily as needed for anxiety, nausea or vomiting. 90  tablet 0   methylphenidate (RITALIN) 5 MG tablet Take 1 tablet (5 mg total) by mouth 2 (two) times daily. 60 tablet 0   montelukast (SINGULAIR) 10 MG tablet Take 1 tablet (10 mg total) by mouth at bedtime. 30 tablet 3   No current facility-administered medications on file prior to visit.   Allergies  Allergen Reactions   Other     Seasonal Allergies-pollen   Venlafaxine Hcl Nausea And Vomiting    Vomiting, hallucinations, fever-like reactions with concomitant use of bupropion   Social History   Socioeconomic History   Marital status: Single    Spouse name: Not on file   Number of children: Not on file   Years of education: Not on file   Highest education level: Not on file  Occupational History   Not on file  Tobacco Use   Smoking status: Never   Smokeless tobacco: Never  Vaping Use   Vaping Use: Some days   Substances: CBD  Substance and Sexual Activity   Alcohol use: Yes    Comment: occasional   Drug use: Yes    Types: Marijuana   Sexual activity: Yes    Birth control/protection: Condom    Comment: menarch age 39.  Lives with mom.  Parents separated.  Other Topics Concern   Not on file  Social History Narrative   In 9th grade at [employer]. She is performing good in school.  No regular exercise.  More than 3 hours of screen time per day.  Enjoys watching netflix, writing poetry and talking to friends.    Social Determinants of Health   Financial Resource Strain: Not on file  Food Insecurity: Food Insecurity Present (12/21/2019)   Hunger Vital Sign    Worried About Running Out of Food in the Last Year: Sometimes true    Ran Out of Food in the Last Year: Sometimes true  Transportation Needs: No Transportation Needs (12/21/2019)   PRAPARE - Administrator, Civil Service (Medical): No    Lack of Transportation (Non-Medical): No  Physical Activity: Inactive (12/21/2019)   Exercise Vital Sign    Days of Exercise per Week: 0 days    Minutes of  Exercise per Session: 0 min  Stress: Stress Concern Present (12/21/2019)   [employer] of Occupational Health - Occupational Stress Questionnaire    Feeling of Stress : Very much  Social Connections: Socially Isolated (12/21/2019)   Social Connection and Isolation Panel [NHANES]    Frequency of Communication with Friends and Family: Twice a week    Frequency of Social Gatherings with Friends and Family: Once a week    Attends Religious Services: Never    Database administrator or Organizations: No    Attends Banker Meetings: Never    Marital Status: Never married  Intimate Partner Violence: Not At Risk (12/21/2019)   Humiliation, Afraid, Rape, and Kick questionnaire    Fear of Current or Ex-Partner: No    Emotionally Abused: No    Physically Abused: No    Sexually Abused: No      Review of Systems  All other systems reviewed and are negative.      Objective:   Physical Exam Constitutional:      General: She is not in acute distress.    Appearance: Normal appearance. She is obese. She is not ill-appearing, toxic-appearing or diaphoretic.  HENT:     Head: Normocephalic and atraumatic.     Right Ear: Tympanic membrane and ear canal normal.     Left Ear: Tympanic membrane and ear canal normal.     Nose: Nose normal. No congestion or rhinorrhea.     Mouth/Throat:     Mouth: Mucous membranes are moist.     Pharynx: Oropharynx is clear. No oropharyngeal exudate or posterior oropharyngeal erythema.  Eyes:  Extraocular Movements: Extraocular movements intact.     Conjunctiva/sclera: Conjunctivae normal.     Pupils: Pupils are equal, round, and reactive to light.  Neck:     Vascular: No carotid bruit.  Cardiovascular:     Rate and Rhythm: Normal rate and regular rhythm.     Heart sounds: Normal heart sounds. No murmur heard.    No gallop.  Pulmonary:     Effort: Pulmonary effort is normal. No respiratory distress.     Breath sounds: Normal breath sounds.  No stridor. No wheezing, rhonchi or rales.  Abdominal:     General: Bowel sounds are normal. There is no distension.     Palpations: Abdomen is soft.     Tenderness: There is no abdominal tenderness. There is no guarding or rebound.  Musculoskeletal:     Cervical back: Normal range of motion and neck supple.     Right lower leg: No edema.     Left lower leg: No edema.  Lymphadenopathy:     Cervical: No cervical adenopathy.  Skin:    Coloration: Skin is not jaundiced or pale.     Findings: No bruising, erythema, lesion or rash.  Neurological:     General: No focal deficit present.     Mental Status: She is alert and oriented to person, place, and time. Mental status is at baseline.     Cranial Nerves: No cranial nerve deficit.     Motor: No weakness.     Coordination: Coordination normal.           Assessment & Plan:  General medical exam  Elevated cholesterol with elevated triglycerides First I recommended 30 minutes a day 5 days a week of aerobic exercise.  Second I recommended a low-fat low-carb diet rich in fruits and vegetables to try to achieve weight loss.  Third I recommended fish oil 2000 mg daily to try to help lower her cholesterol.  I recommended adding a fiber supplement to try to help with the diarrhea that she is experiencing.  The remainder of her lab work is outstanding.  The rest of her physical exam is normal.

## 2022-03-01 NOTE — Progress Notes (Unsigned)
Virtual Visit via Video Note  I connected with Felicia Martinez on 03/03/22 at  1:30 PM EDT by a video enabled telemedicine application and verified that I am speaking with the correct person using two identifiers.  Location: Patient: home Provider: office Persons participated in the visit- patient, provider    I discussed the limitations of evaluation and management by telemedicine and the availability of in person appointments. The patient expressed understanding and agreed to proceed.    I discussed the assessment and treatment plan with the patient. The patient was provided an opportunity to ask questions and all were answered. The patient agreed with the plan and demonstrated an understanding of the instructions.   The patient was advised to call back or seek an in-person evaluation if the symptoms worsen or if the condition fails to improve as anticipated.  I provided 15 minutes of non-face-to-face time during this encounter.   Norman Clay, MD    [facility] MD/PA/NP OP Progress Note  03/03/2022 2:05 PM Felicia Martinez  MRN:  [id number]  Chief Complaint:  Chief Complaint  Patient presents with   Follow-up   HPI:  Is a follow-up appointment for depression and ADHD.  She states that she has been more productive since starting Ritalin. She feels more motivated and hopeful.  She has started the school.  She is taking 3 classes.  Although she tries not to give up, she may need to let go of the one class.  She is easily distracted, and needs to read over and over. She thinks the medication wear off quickly, and she continues to struggle with planning and organization. She is planning to see a therapist, who specializes in ADHD.  She has noticed that she tends to feel more anxious.  She is worried about the future, her family, and herself.  She tends to feel anxious when she hears news such as wildfires. She does not think anxiety has worsened since starting Ritalin. She takes  hydroxyzine so that she would not get panic attack. She denies alcohol use.  She uses marijuana once or twice a day every few days. She is willing to try higher dose of Ritalin this time.   Visit Diagnosis:    ICD-10-CM   1. MDD (major depressive disorder), recurrent episode, mild ([place])  F33.0     2. GAD (generalized anxiety disorder)  F41.1     3. Attention deficit hyperactivity disorder (ADHD), unspecified ADHD type  F90.9     4. Marijuana dependence ([place])  F12.20       Past Psychiatric History: Please see initial evaluation for full details. I have reviewed the history. No updates at this time.     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 I have reviewed the history. No updates at this time.     Family History:  Family History  Problem Relation Age of Onset   Hypertension Mother    Anxiety disorder Mother    Depression Mother    Lung cancer Maternal Grandmother    Schizophrenia Maternal Aunt     Social History:  Social History   Socioeconomic History   Marital status: Single    Spouse name: Not on file   Number of children: Not on file   Years of education: Not on file   Highest education level: Not on file  Occupational History   Not on file  Tobacco Use   Smoking status: Never   Smokeless tobacco: Never  Vaping Use   Vaping Use: Some days   Substances: CBD  Substance and Sexual Activity   Alcohol use: Yes    Comment: occasional   Drug use: Yes    Types: Marijuana   Sexual activity: Yes    Birth control/protection: Condom    Comment: menarch age 11.  Lives with mom.   Parents separated.  Other Topics Concern   Not on file  Social History Narrative   In 9th grade at [employer]. She is performing good in school.  No regular exercise.  More than 3 hours of screen time per day.  Enjoys watching netflix, writing poetry and talking to friends.    Social Determinants of Health   Financial Resource Strain: Not on file  Food Insecurity: Food Insecurity Present (12/21/2019)   Hunger Vital Sign    Worried About Running Out of Food in the Last Year: Sometimes true    Ran Out of Food in the Last Year: Sometimes true  Transportation Needs: No Transportation Needs (12/21/2019)   PRAPARE - Administrator, Civil Service (Medical): No    Lack of Transportation (Non-Medical): No  Physical Activity: Inactive (12/21/2019)   Exercise Vital Sign    Days of Exercise per Week: 0 days    Minutes of Exercise per Session: 0 min  Stress: Stress Concern Present (12/21/2019)   [employer] of Occupational Health - Occupational Stress Questionnaire    Feeling of Stress : Very much  Social Connections: Socially Isolated (12/21/2019)   Social Connection and Isolation Panel [NHANES]    Frequency of Communication with Friends and Family: Twice a week    Frequency of Social Gatherings with Friends and Family: Once a week    Attends Religious Services: Never    Database administrator or Organizations: No    Attends Banker Meetings: Never    Marital Status: Never married    Allergies:  Allergies  Allergen Reactions   Other     Seasonal Allergies-pollen   Venlafaxine Hcl Nausea And Vomiting    Vomiting, hallucinations, fever-like reactions with concomitant use of bupropion    Metabolic Disorder Labs: Lab Results  Component Value Date   HGBA1C 5.0 02/16/2022   MPG 97 02/16/2022   MPG 108 08/06/2020   Lab Results  Component Value Date   PROLACTIN 8.8 01/17/2015   Lab Results  Component Value Date   CHOL 230 (H) 02/16/2022    TRIG 210 (H) 02/16/2022   HDL 48 (L) 02/16/2022   CHOLHDL 4.8 02/16/2022   VLDL 35 (H) 08/02/2015   LDLCALC 147 (H) 02/16/2022   LDLCALC 100 10/18/2019   Lab Results  Component Value Date   TSH 0.738 12/25/2021   TSH 2.280 03/17/2021    Therapeutic Level Labs: No results found for: "LITHIUM" No results found for: "VALPROATE" No results found for: "CBMZ"  Current Medications: Current Outpatient Medications  Medication Sig Dispense Refill   [START ON 03/10/2022] methylphenidate (RITALIN) 10 MG tablet Take 1 tablet (10 mg total) by mouth 2 (two) times daily with breakfast and lunch. 60 tablet 0   [  START ON 04/09/2022] methylphenidate (RITALIN) 10 MG tablet Take 1 tablet (10 mg total) by mouth 2 (two) times daily with breakfast and lunch. 60 tablet 0   albuterol (VENTOLIN HFA) 108 (90 Base) MCG/ACT inhaler Inhale 2 puffs into the lungs every 6 (six) hours as needed for wheezing or shortness of breath. 8 g 0   [START ON 03/23/2022] buPROPion (WELLBUTRIN XL) 150 MG 24 hr tablet Take 3 tablets (450 mg total) by mouth daily. 270 tablet 0   hydrOXYzine (ATARAX) 25 MG tablet Take 1 tablet (25 mg total) by mouth daily as needed for anxiety, nausea or vomiting. 90 tablet 0   methylphenidate (RITALIN) 5 MG tablet Take 1 tablet (5 mg total) by mouth 2 (two) times daily. 60 tablet 0   montelukast (SINGULAIR) 10 MG tablet Take 1 tablet (10 mg total) by mouth at bedtime. 30 tablet 3   No current facility-administered medications for this visit.     Musculoskeletal: Strength & Muscle Tone: within normal limits Gait & Station: normal Patient leans: N/A  Psychiatric Specialty Exam: Review of Systems  Psychiatric/Behavioral:  Positive for decreased concentration. Negative for agitation, behavioral problems, confusion, dysphoric mood, hallucinations, self-injury, sleep disturbance and suicidal ideas. The patient is nervous/anxious. The patient is not hyperactive.   All other systems reviewed and are  negative.   There were no vitals taken for this visit.There is no height or weight on file to calculate BMI.  General Appearance: Fairly Groomed  Eye Contact:  Good  Speech:  Clear and Coherent  Volume:  Normal  Mood:  Anxious  Affect:  Appropriate, Congruent, and calm  Thought Process:  Coherent  Orientation:  Full (Time, Place, and Person)  Thought Content: Logical   Suicidal Thoughts:  No  Homicidal Thoughts:  No  Memory:  Immediate;   Good  Judgement:  Good  Insight:  Good  Psychomotor Activity:  Normal  Concentration:  Concentration: Good and Attention Span: Good  Recall:  Good  Fund of Knowledge: Good  Language: Good  Akathisia:  No  Handed:  Right  AIMS (if indicated): not done  Assets:  Communication Skills Desire for Improvement  ADL's:  Intact  Cognition: WNL  Sleep:  Good   Screenings: GAD-7    Flowsheet Row Office Visit from 01/26/2022 in [place] Office Visit from 12/21/2019 in [place]  Total GAD-7 Score 17 5      PHQ2-9    [place] Office Visit from 02/17/2022 in [place] Visit from 01/26/2022 in [place] Office Visit from 12/22/2021 in [place] Office Visit from 11/20/2021 in [place] Video Visit from 04/15/2021 in [place]  PHQ-2 Total Score 0 0 1 3 2   PHQ-9 Total Score -- 8 15 17 6       [place] Office Visit from 01/26/2022 in [place] ED from 12/25/2021 in [place] Visit from 12/22/2021 in [place] Error: Q3, 4, or 5 should not be populated when Q2 is No No Risk Error: Q3, 4, or 5 should not be populated when Q2 is No        Assessment and Plan:  Felicia Martinez is a 21 y.o. year old female with a history of depression, who  presents for follow up appointment for below.   1. MDD (major depressive disorder), recurrent episode, mild ([place]) 2. GAD (  generalized anxiety disorder) She reports slight worsening in anxiety, although she thinks it has been overall improving since starting Ritalin.  She agrees to prioritize treatment for ADHD at this time especially given her significant side effect from venlafaxine in the past.  Will continue current dose of bupropion to target depression. Noted that she has significant adverse reaction of vomiting/fever from venlafaxine.  Will consider judicious use of antidepressant if she were to try in the future.   3. Attention deficit hyperactivity disorder (ADHD), unspecified ADHD type Significant improvement since starting the Ritalin.  Will uptitrate the dose to optimize treatment for ADHD.  Discussed potential risk of palpitation, headache, worsening in anxiety.   4. Marijuana dependence (HCC) Improving.  Will continue motivational interview.    5. Insomnia, unspecified type Referral was made for evaluation of sleep apnea.  She has agreed to contact the clinic again to make an appointment.    Plan Continue bupropion 450 mg daily  Continue Ritalin 5 mg twice a day Next appointment: 10/24 at 2:30 for 30 mins, in person   This clinician has discussed the side effect associated with medication prescribed during this encounter. Please refer to notes in the previous encounters for more details.      Past trials of medication: sertraline, lexapro (anxiety), Concerta, Abilify (weight gain) Trazodone, melatonin (felt sad)   The patient demonstrates the following risk factors for suicide: Chronic risk factors for suicide include: psychiatric disorder of depression. Acute risk factors for suicide include: N/A. Protective factors for this patient include: positive social support, coping skills and hope for the future. Considering these factors, the overall suicide risk at this point appears  to be low. Patient is appropriate for outpatient follow up   I have utilized the [place] Controlled Substances Reporting System (PMP AWARxE) to confirm adherence regarding the patient's medication. My review reveals appropriate prescription fills.   This clinician has discussed the side effect associated with medication prescribed during this encounter. Please refer to notes in the previous encounters for more details.    Collaboration of Care: Collaboration of Care: Other N/A  Patient/Guardian was advised Release of Information must be obtained prior to any record release in order to collaborate their care with an outside provider. Patient/Guardian was advised if they have not already done so to contact the registration department to sign all necessary forms in order for [place] to release information regarding their care.   Consent: Patient/Guardian gives verbal consent for treatment and assignment of benefits for services provided during this visit. Patient/Guardian expressed understanding and agreed to proceed.    Neysa Hotter, MD 03/03/2022, 2:05 PM

## 2022-03-03 ENCOUNTER — Encounter: Payer: Self-pay | Admitting: Psychiatry

## 2022-03-03 ENCOUNTER — Encounter (HOSPITAL_COMMUNITY): Payer: Self-pay

## 2022-03-03 ENCOUNTER — Other Ambulatory Visit: Payer: Self-pay

## 2022-03-03 ENCOUNTER — Telehealth (INDEPENDENT_AMBULATORY_CARE_PROVIDER_SITE_OTHER): Payer: Medicaid Other | Admitting: Psychiatry

## 2022-03-03 DIAGNOSIS — J45909 Unspecified asthma, uncomplicated: Secondary | ICD-10-CM | POA: Diagnosis not present

## 2022-03-03 DIAGNOSIS — F1220 Cannabis dependence, uncomplicated: Secondary | ICD-10-CM

## 2022-03-03 DIAGNOSIS — G44209 Tension-type headache, unspecified, not intractable: Secondary | ICD-10-CM | POA: Diagnosis not present

## 2022-03-03 DIAGNOSIS — F909 Attention-deficit hyperactivity disorder, unspecified type: Secondary | ICD-10-CM | POA: Diagnosis not present

## 2022-03-03 DIAGNOSIS — F330 Major depressive disorder, recurrent, mild: Secondary | ICD-10-CM | POA: Diagnosis not present

## 2022-03-03 DIAGNOSIS — I10 Essential (primary) hypertension: Secondary | ICD-10-CM | POA: Insufficient documentation

## 2022-03-03 DIAGNOSIS — R519 Headache, unspecified: Secondary | ICD-10-CM | POA: Diagnosis present

## 2022-03-03 DIAGNOSIS — F411 Generalized anxiety disorder: Secondary | ICD-10-CM | POA: Diagnosis not present

## 2022-03-03 MED ORDER — BUPROPION HCL ER (XL) 150 MG PO TB24
450.0000 mg | ORAL_TABLET | Freq: Every day | ORAL | 0 refills | Status: DC
Start: 2022-03-23 — End: 2022-05-14

## 2022-03-03 MED ORDER — METHYLPHENIDATE HCL 10 MG PO TABS: 10.0000 mg | ORAL_TABLET | Freq: Two times a day (BID) | ORAL | 0 refills | Status: DC

## 2022-03-03 NOTE — ED Triage Notes (Signed)
Pt to er, pt states that she woke up with a headache today, states that she thought that it was her migraines but it didn't go away, states that she checked her blood pressure tonight and it was high so she came into the er.

## 2022-03-03 NOTE — Patient Instructions (Signed)
Continue bupropion 450 mg daily  Continue Ritalin 5 mg twice a day Next appointment: 10/24 at 2:30

## 2022-03-04 ENCOUNTER — Emergency Department (HOSPITAL_COMMUNITY)
Admission: EM | Admit: 2022-03-04 | Discharge: 2022-03-04 | Disposition: A | Discharge status: Home / Self Care | Payer: Medicaid Other | Attending: Emergency Medicine | Admitting: Emergency Medicine

## 2022-03-04 DIAGNOSIS — G44209 Tension-type headache, unspecified, not intractable: Secondary | ICD-10-CM

## 2022-03-04 DIAGNOSIS — I10 Essential (primary) hypertension: Secondary | ICD-10-CM

## 2022-03-04 HISTORY — DX: Attention-deficit hyperactivity disorder, unspecified type: F90.9

## 2022-03-04 MED ORDER — KETOROLAC TROMETHAMINE 15 MG/ML IJ SOLN
15.0000 mg | Freq: Once | INTRAMUSCULAR | Status: AC
  Administered 2022-03-04: 15 mg via INTRAMUSCULAR
  Filled 2022-03-04: qty 1

## 2022-03-04 NOTE — ED Provider Notes (Signed)
[name] EMERGENCY DEPARTMENT Provider Note   CSN: [id number] Arrival date & time: 03/03/22  2310     History  Chief Complaint  Patient presents with   Headache    Felicia Martinez is a 21 y.o. female.  HPI     This is a 21 year old female who presents with headache.  Patient reports that she woke up yesterday morning with a headache.  She describes pain in the back of her neck and tension that would not go away.  She does have a history of migraines but this felt different.  She checked her blood pressure at home and it was 180/100.  She has previously seen her primary doctor and had isolated high blood pressures.  She was to return for blood pressure recheck but has not had the opportunity.  She reports occasional sharp pains "all over the body."  She denies weakness, numbness, vision changes.  Patient states her headache has resolved at this point.  She is symptom-free.  Home Medications Prior to Admission medications   Medication Sig Start Date End Date Taking? Authorizing Provider  albuterol (VENTOLIN HFA) 108 (90 Base) MCG/ACT inhaler Inhale 2 puffs into the lungs every 6 (six) hours as needed for wheezing or shortness of breath. 10/09/21   Donita Brooks, MD  buPROPion (WELLBUTRIN XL) 150 MG 24 hr tablet Take 3 tablets (450 mg total) by mouth daily. 03/23/22 06/21/22  Neysa Hotter, MD  hydrOXYzine (ATARAX) 25 MG tablet Take 1 tablet (25 mg total) by mouth daily as needed for anxiety, nausea or vomiting. 02/05/22 05/06/22  Neysa Hotter, MD  methylphenidate (RITALIN) 10 MG tablet Take 1 tablet (10 mg total) by mouth 2 (two) times daily with breakfast and lunch. 03/10/22 04/09/22  Neysa Hotter, MD  methylphenidate (RITALIN) 10 MG tablet Take 1 tablet (10 mg total) by mouth 2 (two) times daily with breakfast and lunch. 04/09/22 05/09/22  Neysa Hotter, MD  methylphenidate (RITALIN) 5 MG tablet Take 1 tablet (5 mg total) by mouth 2 (two) times daily. 02/02/22 03/04/22  Neysa Hotter, MD  montelukast (SINGULAIR) 10 MG tablet Take 1 tablet (10 mg total) by mouth at bedtime. 10/09/21   Donita Brooks, MD      Allergies    Other and Venlafaxine hcl    Review of Systems   Review of Systems  Constitutional:  Negative for fever.  Neurological:  Positive for headaches.  All other systems reviewed and are negative.   Physical Exam Updated Vital Signs BP 127/79   Pulse 84   Temp 98.1 F (36.7 C) (Oral)   Resp 15   Ht 1.549 m (5\' 1" )   Wt 72.6 kg   SpO2 100%   BMI 30.23 kg/m  Physical Exam Vitals and nursing note reviewed.  Constitutional:      Appearance: She is well-developed. She is obese. She is not ill-appearing.  HENT:     Head: Normocephalic and atraumatic.  Eyes:     Pupils: Pupils are equal, round, and reactive to light.  Cardiovascular:     Rate and Rhythm: Normal rate and regular rhythm.     Heart sounds: Normal heart sounds.  Pulmonary:     Effort: Pulmonary effort is normal. No respiratory distress.     Breath sounds: No wheezing.  Abdominal:     Palpations: Abdomen is soft.  Musculoskeletal:     Cervical back: Normal range of motion and neck supple.  Skin:    General: Skin is warm and  dry.  Neurological:     Mental Status: She is alert and oriented to person, place, and time.     Comments: Cranial nerves II through XII intact, 5 out of 5 strength in all 4 extremities, no dysmetria to finger-nose-finger  Psychiatric:        Mood and Affect: Mood normal.     ED Results / Procedures / Treatments   Labs (all labs ordered are listed, but only abnormal results are displayed) Labs Reviewed - No data to display  EKG None  Radiology No results found.  Procedures Procedures    Medications Ordered in ED Medications  ketorolac (TORADOL) 15 MG/ML injection 15 mg (15 mg Intramuscular Given 03/04/22 0258)    ED Course/ Medical Decision Making/ A&P                           Medical Decision Making Risk Prescription drug  management.   This patient presents to the ED for concern of headache, this involves an extensive number of treatment options, and is a complaint that carries with it a high risk of complications and morbidity.  I considered the following differential and admission for this acute, potentially life threatening condition.  The differential diagnosis includes tension headache, migraine, hypertensive urgency, hypertensive emergency  MDM:    This is a 21 year old female who presents with headache.  She is overall nontoxic-appearing and vital signs were initially notable for blood pressure of 169/101 and heart rate of 119.  On my evaluation she is completely asymptomatic.  Heart rate is in the 80s.  Repeat blood pressure is 127/79.  She is neurologically intact.  She really has no red flags.  She denies worst headache of her life.  Doubt subarachnoid hemorrhage.  No signs or symptoms of hypertensive urgency or emergency.  She describes pain in the back of the neck with a headache which could be suggestive of tension headache.  Overall do not feel she needs imaging at this time.  Would have her follow-up closely with her primary physician for recheck of blood pressure.  We discussed supportive care at home.  (Labs, imaging, consults)  Labs: I Ordered, and personally interpreted labs.  The pertinent results include: None  Imaging Studies ordered: I ordered imaging studies including none I independently visualized and interpreted imaging. I agree with the radiologist interpretation  Additional history obtained from chart review.  External records from outside source obtained and reviewed including prior evaluations  Cardiac Monitoring: The patient was maintained on a cardiac monitor.  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

## 2022-03-04 NOTE — Discharge Instructions (Signed)
You were seen today for headache.  Your headache had resolved upon my evaluation.  Your blood pressure also trended down into normal range.  You need to follow-up closely with your primary doctor for blood pressure recheck.  Take Tylenol or ibuprofen for any ongoing headache.

## 2022-03-25 ENCOUNTER — Other Ambulatory Visit (HOSPITAL_COMMUNITY)
Admission: RE | Admit: 2022-03-25 | Discharge: 2022-03-25 | Disposition: A | Discharge status: Home / Self Care | Payer: Medicaid Other | Source: Ambulatory Visit | Attending: Adult Health | Admitting: Adult Health

## 2022-03-25 ENCOUNTER — Ambulatory Visit (INDEPENDENT_AMBULATORY_CARE_PROVIDER_SITE_OTHER): Payer: Medicaid Other | Admitting: Adult Health

## 2022-03-25 ENCOUNTER — Encounter: Payer: Self-pay | Admitting: Adult Health

## 2022-03-25 VITALS — BP 116/77 | HR 83 | Ht 62.0 in | Wt 156.5 lb

## 2022-03-25 DIAGNOSIS — Z01419 Encounter for gynecological examination (general) (routine) without abnormal findings: Secondary | ICD-10-CM | POA: Insufficient documentation

## 2022-03-25 DIAGNOSIS — Z124 Encounter for screening for malignant neoplasm of cervix: Secondary | ICD-10-CM | POA: Diagnosis not present

## 2022-03-25 DIAGNOSIS — Z0000 Encounter for general adult medical examination without abnormal findings: Secondary | ICD-10-CM | POA: Diagnosis not present

## 2022-03-25 NOTE — Progress Notes (Signed)
Patient ID: Felicia Martinez, female   DOB: December 14, 2000, 21 y.o.   MRN: [id number] History of Present Illness: Felicia Martinez is a 21 year old white female,single, G0P0, in for a well woman pelvic exam and her first pap. She had a physical with PCP 02/17/22.  PCP is Dr Tanya Nones.   Current Medications, Allergies, Past Medical History, Past Surgical History, Family History and Social History were reviewed in [employer] record.     Review of Systems: Patient denies any headaches, hearing loss, fatigue, blurred vision, shortness of breath, chest pain, abdominal pain, problems with bowel movements, urination, or intercourse. No joint pain. She sees [facility] and is on meds for her moods     Physical Exam:BP 116/77 (BP Location: Left Arm, Patient Position: Sitting, Cuff Size: Normal)   Pulse 83   Ht 5\' 2"  (1.575 m)   Wt 156 lb 8 oz (71 kg)   LMP 03/09/2022 (Approximate)   BMI 28.62 kg/m   General:  Well developed, well nourished, no acute distress Skin:  Warm and dry Neck:  Midline trachea, normal thyroid, good ROM, no lymphadenopathy Lungs; Clear to auscultation bilaterally Breast:  No dominant palpable mass, retraction, or nipple discharge Cardiovascular: Regular rate and rhythm Abdomen:  Soft, non tender, no hepatosplenomegaly Pelvic:  External genitalia is normal in appearance, no lesions.  The vagina is normal in appearance. Urethra has no lesions or masses. The cervix is smooth, pap with GC/CHL performed.  Uterus is felt to be normal size, shape, and contour.  No adnexal masses or tenderness noted.Bladder is non tender, no masses felt. Extremities/musculoskeletal:  No swelling or varicosities noted, no clubbing or cyanosis Psych:  No mood changes, alert and cooperative,seems happy AA is 2 Fall ris is low    03/25/2022    1:35 PM 02/17/2022    2:45 PM 01/26/2022    3:30 PM  Depression screen PHQ 2/9  Decreased Interest 2 0   Down, Depressed, Hopeless 1 0   PHQ - 2 Score 3  0   Altered sleeping 3    Tired, decreased energy 3    Change in appetite 2    Feeling bad or failure about yourself  1    Trouble concentrating 2    Moving slowly or fidgety/restless 2    Suicidal thoughts 0    PHQ-9 Score 16    Difficult doing work/chores        Information is confidential and restricted. Go to Review Flowsheets to unlock data.   She is on meds and sees [facility]    03/25/2022    1:35 PM 01/26/2022    3:30 PM 12/21/2019    2:51 PM  GAD 7 : Generalized Anxiety Score  Nervous, Anxious, on Edge 1  0  Control/stop worrying 1  0  Worry too much - different things 1  0  Trouble relaxing 1  1  Restless 1  1  Easily annoyed or irritable 2  3  Afraid - awful might happen 2  0  Total GAD 7 Score 9  5  Anxiety Difficulty        Information is confidential and restricted. Go to Review Flowsheets to unlock data.      Upstream - 03/25/22 1344       Pregnancy Intention Screening   Does the patient want to become pregnant in the next year? No    Does the patient's partner want to become pregnant in the next year? No    Would  the patient like to discuss contraceptive options today? No      Contraception Wrap Up   Current Method Female Condom    End Method Female Condom             Examination chaperoned by Levy Pupa LPN  Impression and Plan: 1. Routine general medical examination at a health care facility Pap sent Physical with PCP Pap in 3 years if normal Labs with PCP  2. Encounter for cervical Pap smear with pelvic exam Pap sent Pap in 3 years if normal Pelvic exam in 1 year

## 2022-03-30 LAB — CYTOLOGY - PAP
Adequacy: ABSENT
Chlamydia: NEGATIVE
Comment: NEGATIVE
Comment: NORMAL
Diagnosis: NEGATIVE
Neisseria Gonorrhea: NEGATIVE

## 2022-04-03 ENCOUNTER — Ambulatory Visit (INDEPENDENT_AMBULATORY_CARE_PROVIDER_SITE_OTHER): Payer: Medicaid Other | Admitting: Family Medicine

## 2022-04-03 VITALS — BP 120/72 | HR 78 | Ht 62.0 in | Wt 160.0 lb

## 2022-04-03 DIAGNOSIS — M67431 Ganglion, right wrist: Secondary | ICD-10-CM

## 2022-04-03 NOTE — Progress Notes (Signed)
Subjective:    Patient ID: Felicia Martinez, female    DOB: 2001/06/07, 21 y.o.   MRN: [id number]  HPI Patient presents today with a cyst on the dorsum of her right wrist that has been there for 2 months.  It is located near the first [facility] joint.  It is freely mobile.  It is about 2 cm in diameter.  It is soft.  There is no erythema. Past Medical History:  Diagnosis Date   ADHD    Allergy    Asthma    Morbid obesity ([place])    Schizophrenia in children    Past Surgical History:  Procedure Laterality Date   CHOLECYSTECTOMY N/A 04/21/2021   Procedure: LAPAROSCOPIC CHOLECYSTECTOMY;  Surgeon: Aviva Signs, MD;  Location: [facility];  Service: General;  Laterality: N/A;   Current Outpatient Medications on File Prior to Visit  Medication Sig Dispense Refill   albuterol (VENTOLIN HFA) 108 (90 Base) MCG/ACT inhaler Inhale 2 puffs into the lungs every 6 (six) hours as needed for wheezing or shortness of breath. 8 g 0   buPROPion (WELLBUTRIN XL) 150 MG 24 hr tablet Take 3 tablets (450 mg total) by mouth daily. 270 tablet 0   hydrOXYzine (ATARAX) 25 MG tablet Take 1 tablet (25 mg total) by mouth daily as needed for anxiety, nausea or vomiting. 90 tablet 0   [START ON 04/09/2022] methylphenidate (RITALIN) 10 MG tablet Take 1 tablet (10 mg total) by mouth 2 (two) times daily with breakfast and lunch. 60 tablet 0   Omega-3 Fatty Acids (FISH OIL PO) Take by mouth.     methylphenidate (RITALIN) 5 MG tablet Take 1 tablet (5 mg total) by mouth 2 (two) times daily. 60 tablet 0   No current facility-administered medications on file prior to visit.   Allergies  Allergen Reactions   Other     Seasonal Allergies-pollen   Venlafaxine Hcl Nausea And Vomiting    Vomiting, hallucinations, fever-like reactions with concomitant use of bupropion   Social History   Socioeconomic History   Marital status: Single    Spouse name: Not on file   Number of children: Not on file   Years of education: Not on file    Highest education level: Not on file  Occupational History   Not on file  Tobacco Use   Smoking status: Never   Smokeless tobacco: Never  Vaping Use   Vaping Use: Former   Substances: CBD  Substance and Sexual Activity   Alcohol use: Yes    Comment: occasional   Drug use: Not Currently    Types: Marijuana   Sexual activity: Yes    Birth control/protection: Condom    Comment: menarch age 53.  Lives with mom.  Parents separated.  Other Topics Concern   Not on file  Social History Narrative   In 9th grade at [employer]. She is performing good in school.  No regular exercise.  More than 3 hours of screen time per day.  Enjoys watching netflix, writing poetry and talking to friends.    Social Determinants of Health   Financial Resource Strain: Medium Risk (03/25/2022)   Overall Financial Resource Strain (CARDIA)    Difficulty of Paying Living Expenses: Somewhat hard  Food Insecurity: Food Insecurity Present (03/25/2022)   Hunger Vital Sign    Worried About Running Out of Food in the Last Year: Often true    Ran Out of Food in the Last Year: Often true  Transportation Needs: No  Transportation Needs (03/25/2022)   PRAPARE - Administrator, Civil Service (Medical): No    Lack of Transportation (Non-Medical): No  Physical Activity: Insufficiently Active (03/25/2022)   Exercise Vital Sign    Days of Exercise per Week: 3 days    Minutes of Exercise per Session: 10 min  Stress: Stress Concern Present (03/25/2022)   [employer] of Occupational Health - Occupational Stress Questionnaire    Feeling of Stress : Rather much  Social Connections: Socially Isolated (03/25/2022)   Social Connection and Isolation Panel [NHANES]    Frequency of Communication with Friends and Family: Three times a week    Frequency of Social Gatherings with Friends and Family: Never    Attends Religious Services: Never    Database administrator or Organizations: No    Attends Tax inspector Meetings: Never    Marital Status: Never married  Intimate Partner Violence: Not At Risk (03/25/2022)   Humiliation, Afraid, Rape, and Kick questionnaire    Fear of Current or Ex-Partner: No    Emotionally Abused: No    Physically Abused: No    Sexually Abused: No      Review of Systems  All other systems reviewed and are negative.      Objective:   Physical Exam Constitutional:      Appearance: Normal appearance. She is obese.  HENT:     Nose: No congestion or rhinorrhea.     Mouth/Throat:     Mouth: Mucous membranes are moist.     Pharynx: Oropharynx is clear. No oropharyngeal exudate or posterior oropharyngeal erythema.  Eyes:     Conjunctiva/sclera: Conjunctivae normal.  Cardiovascular:     Rate and Rhythm: Normal rate and regular rhythm.     Heart sounds: Normal heart sounds. No murmur heard.    No gallop.  Pulmonary:     Effort: Pulmonary effort is normal. No respiratory distress.     Breath sounds: Normal breath sounds.  Musculoskeletal:     Left hand: Deformity present. No swelling, tenderness or bony tenderness. Normal range of motion. Normal strength.       Hands:     Cervical back: Neck supple.     Right lower leg: No edema.     Left lower leg: No edema.  Neurological:     Mental Status: She is alert.           Assessment & Plan:  Ganglion cyst of dorsum of right wrist Patient has a benign ganglion cyst.  We discussed a referral to orthopedist to have this removed surgically.  At the present time the patient elects just to monitor it

## 2022-04-18 NOTE — Progress Notes (Unsigned)
BH MD/PA/NP OP Progress Note  04/21/2022 3:07 PM Felicia Martinez  MRN:  [id number]  Chief Complaint:  Chief Complaint  Patient presents with   Depression   HPI:  This is a follow-up appointment for depression and ADHD.  She states that she had hypertension when she was taking Ritalin 10 mg twice a day.  She was also having palpitation when she takes the second dose.  She likes the higher dose better except the symptoms as she is able to focus better.  She states that she continues to struggle as she cannot make her brain focus and absorb information.  She genies to read pages over and over.  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  Although she used to blame herself for not being able to do well at school, she does not feel this way anymore.  She talks about her boyfriend, who she is a relationship for several months.  He is in [place] for medical school.  Although she is not proposed yet, they are thinking of getting married in the near future.  She is consenting of living in [place]. She verbalized understanding to establish the care there if she were to stay there long term. The patient has mood symptoms as in PHQ-9/GAD-7. She denies SI. She denies alcohol use.  Although she smoked marijuana yesterday, she has not use it lately otherwise.  She is willing to try a lower dose of Ritalin at this time.   Physical Exam:BP 116/77 (BP Location: Left Arm, Patient Position: Sitting, Cuff Size: Normal)   Pulse 83   Ht 5\' 2"  (1.575 m)   Wt 156 lb 8 oz (71 kg)   9/27  Wt Readings from Last 3 Encounters:  04/21/22 155 lb 12.8 oz (70.7 kg)  04/03/22 160 lb (72.6 kg)  03/25/22 156 lb 8 oz (71 kg)     Visit Diagnosis:    ICD-10-CM   1. Attention deficit hyperactivity disorder (ADHD), unspecified ADHD type  F90.9 Drug Screen, Urine    CANCELED: Drug Screen, Urine    2. MDD (major depressive disorder), recurrent episode, mild  (HCC)  F33.0     3. GAD (generalized anxiety disorder)  F41.1       Past Psychiatric History: Please see initial evaluation for full details. I have reviewed the history. No updates at this time.     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 I have reviewed the history. No updates at this time.     Family History:  Family History  Problem Relation Age of Onset   Hypertension Mother    Anxiety disorder Mother    Depression Mother    Lung cancer Maternal Grandmother    Schizophrenia Maternal Aunt     Social History:  Social History   Socioeconomic History   Marital status: Single    Spouse name: Not on file   Number of children: Not on file   Years of education: Not on file  Highest education level: Not on file  Occupational History   Not on file  Tobacco Use   Smoking status: Never   Smokeless tobacco: Never  Vaping Use   Vaping Use: Former   Substances: CBD  Substance and Sexual Activity   Alcohol use: Yes    Comment: occasional   Drug use: Not Currently    Types: Marijuana   Sexual activity: Yes    Birth control/protection: Condom    Comment: menarch age 89.  Lives with mom.  Parents separated.  Other Topics Concern   Not on file  Social History Narrative   In 9th grade at [employer]. She is performing good in school.  No regular exercise.  More than 3 hours of screen time per day.  Enjoys watching netflix, writing poetry and talking to friends.    Social Determinants of Health   Financial Resource Strain: Medium Risk (03/25/2022)   Overall  Financial Resource Strain (CARDIA)    Difficulty of Paying Living Expenses: Somewhat hard  Food Insecurity: Food Insecurity Present (03/25/2022)   Hunger Vital Sign    Worried About Running Out of Food in the Last Year: Often true    Ran Out of Food in the Last Year: Often true  Transportation Needs: No Transportation Needs (03/25/2022)   PRAPARE - Administrator, Civil Service (Medical): No    Lack of Transportation (Non-Medical): No  Physical Activity: Insufficiently Active (03/25/2022)   Exercise Vital Sign    Days of Exercise per Week: 3 days    Minutes of Exercise per Session: 10 min  Stress: Stress Concern Present (03/25/2022)   [employer] of Occupational Health - Occupational Stress Questionnaire    Feeling of Stress : Rather much  Social Connections: Socially Isolated (03/25/2022)   Social Connection and Isolation Panel [NHANES]    Frequency of Communication with Friends and Family: Three times a week    Frequency of Social Gatherings with Friends and Family: Never    Attends Religious Services: Never    Database administrator or Organizations: No    Attends Banker Meetings: Never    Marital Status: Never married    Allergies:  Allergies  Allergen Reactions   Other     Seasonal Allergies-pollen   Venlafaxine Hcl Nausea And Vomiting    Vomiting, hallucinations, fever-like reactions with concomitant use of bupropion    Metabolic Disorder Labs: Lab Results  Component Value Date   HGBA1C 5.0 02/16/2022   MPG 97 02/16/2022   MPG 108 08/06/2020   Lab Results  Component Value Date   PROLACTIN 8.8 01/17/2015   Lab Results  Component Value Date   CHOL 230 (H) 02/16/2022   TRIG 210 (H) 02/16/2022   HDL 48 (L) 02/16/2022   CHOLHDL 4.8 02/16/2022   VLDL 35 (H) 08/02/2015   LDLCALC 147 (H) 02/16/2022   LDLCALC 100 10/18/2019   Lab Results  Component Value Date   TSH 0.738 12/25/2021   TSH 2.280 03/17/2021    Therapeutic Level  Labs: No results found for: "LITHIUM" No results found for: "VALPROATE" No results found for: "CBMZ"  Current Medications: Current Outpatient Medications  Medication Sig Dispense Refill   albuterol (VENTOLIN HFA) 108 (90 Base) MCG/ACT inhaler Inhale 2 puffs into the lungs every 6 (six) hours as needed for wheezing or shortness of breath. 8 g 0   buPROPion (WELLBUTRIN XL) 150 MG 24 hr tablet Take 3 tablets (450 mg total) by mouth daily. 270  tablet 0   hydrOXYzine (ATARAX) 25 MG tablet Take 1 tablet (25 mg total) by mouth daily as needed for anxiety, nausea or vomiting. 90 tablet 0   methylphenidate (RITALIN) 10 MG tablet Take 1 tablet (10 mg total) by mouth 2 (two) times daily with breakfast and lunch. 60 tablet 0   Omega-3 Fatty Acids (FISH OIL PO) Take by mouth.     methylphenidate (RITALIN) 5 MG tablet Take 1 tablet (5 mg total) by mouth 2 (two) times daily. 60 tablet 0   No current facility-administered medications for this visit.     Musculoskeletal: Strength & Muscle Tone: within normal limits Gait & Station: normal Patient leans: N/A  Psychiatric Specialty Exam: Review of Systems  Psychiatric/Behavioral:  Positive for decreased concentration and sleep disturbance. Negative for agitation, behavioral problems, confusion, dysphoric mood, hallucinations, self-injury and suicidal ideas. The patient is not nervous/anxious and is not hyperactive.   All other systems reviewed and are negative.   Blood pressure 132/80, pulse 96, temperature 99.1 F (37.3 C), temperature source Oral, weight 155 lb 12.8 oz (70.7 kg), last menstrual period 04/13/2022.Body mass index is 28.5 kg/m.  General Appearance: Fairly Groomed  Eye Contact:  Good  Speech:  Clear and Coherent  Volume:  Normal  Mood:   good  Affect:  Appropriate, Congruent, and calm  Thought Process:  Coherent  Orientation:  Full (Time, Place, and Person)  Thought Content: Logical   Suicidal Thoughts:  No  Homicidal Thoughts:   No  Memory:  Immediate;   Good  Judgement:  Good  Insight:  Good  Psychomotor Activity:  Normal  Concentration:  Concentration: Good and Attention Span: Good  Recall:  Good  Fund of Knowledge: Good  Language: Good  Akathisia:  No  Handed:  Right  AIMS (if indicated): not done  Assets:  Communication Skills Desire for Improvement  ADL's:  Intact  Cognition: WNL  Sleep:  Good   Screenings: GAD-7    Flowsheet Row Office Visit from 04/21/2022 in [place] Office Visit from 03/25/2022 in [place] Office Visit from 01/26/2022 in [place] Office Visit from 12/21/2019 in [place]  Total GAD-7 Score 2 9 17 5       PHQ2-9    [place] Office Visit from 04/21/2022 in [place] Office Visit from 04/03/2022 in [place] Visit from 03/25/2022 in [facility] Office Visit from 02/17/2022 in [place] Visit from 01/26/2022 in [place]  PHQ-2 Total Score 1 1 3  0 0  PHQ-9 Total Score 6 -- 16 -- [place] Office Visit from 04/21/2022 in [place] ED from 03/04/2022 in [place] Office Visit from 01/26/2022 in [place] Error: Q3, 4, or 5 should not be populated when Q2 is No No Risk Error: Q3, 4, or 5 should not be populated when Q2 is No        Assessment and Plan:  Felicia Martinez is a 21 y.o. year old female with a history of depression, who presents for follow up appointment for below.     1. Attention deficit hyperactivity disorder (ADHD), unspecified ADHD type She reports palpitation and occasional hypertension at higher dose of Ritalin.  Will lower the evening dose to mitigate the side effect.   2. MDD (major depressive disorder), recurrent  episode, mild  (HCC) 3. GAD (generalized anxiety disorder) There has been steady improvement in depressive symptoms and anxiety, which coincided with being in a relationship with potential marriage in the future.  Will continue current dose of bupropion to target depression. Noted that she has significant adverse reaction of vomiting/fever from venlafaxine.  Will consider judicious use of antidepressant if she were to try in the future.    4. Marijuana dependence (HCC) Improving.  Will continue motivational interview.  She verbalized understanding to get urine test done given she is on Ritalin.    5. Insomnia, unspecified type Referral was made for evaluation of sleep apnea.  She has agreed to contact the clinic again to make an appointment.    Plan Continue bupropion 450 mg daily  Decrease Ritalin 10 mg in AM, 5 mg in PM (was taking 10 mg BID) Obtain urine drug test Next appointment: 12/19 at 3 PM for 30 mins, in person   This clinician has discussed the side effect associated with medication prescribed during this encounter. Please refer to notes in the previous encounters for more details.      Past trials of medication: sertraline, lexapro (anxiety), Concerta, Abilify (weight gain) Trazodone, melatonin (felt sad)   The patient demonstrates the following risk factors for suicide: Chronic risk factors for suicide include: psychiatric disorder of depression. Acute risk factors for suicide include: N/A. Protective factors for this patient include: positive social support, coping skills and hope for the future. Considering these factors, the overall suicide risk at this point appears to be low. Patient is appropriate for outpatient follow up      Collaboration of Care: Collaboration of Care: Other reviewed notes in Epic  Patient/Guardian was advised Release of Information must be obtained prior to any record release in order to collaborate their care with an outside provider. Patient/Guardian was advised  if they have not already done so to contact the registration department to sign all necessary forms in order for [place] to release information regarding their care.   Consent: Patient/Guardian gives verbal consent for treatment and assignment of benefits for services provided during this visit. Patient/Guardian expressed understanding and agreed to proceed.    Neysa Hotter, MD 04/21/2022, 3:07 PM

## 2022-04-21 ENCOUNTER — Other Ambulatory Visit
Admission: RE | Admit: 2022-04-21 | Discharge: 2022-04-21 | Disposition: A | Discharge status: Home / Self Care | Payer: Medicaid Other | Source: Ambulatory Visit | Attending: Psychiatry | Admitting: Psychiatry

## 2022-04-21 ENCOUNTER — Encounter: Payer: Self-pay | Admitting: Psychiatry

## 2022-04-21 ENCOUNTER — Ambulatory Visit (INDEPENDENT_AMBULATORY_CARE_PROVIDER_SITE_OTHER): Payer: Medicaid Other | Admitting: Psychiatry

## 2022-04-21 VITALS — BP 132/80 | HR 96 | Temp 99.1°F | Wt 155.8 lb

## 2022-04-21 DIAGNOSIS — F411 Generalized anxiety disorder: Secondary | ICD-10-CM

## 2022-04-21 DIAGNOSIS — F909 Attention-deficit hyperactivity disorder, unspecified type: Secondary | ICD-10-CM | POA: Insufficient documentation

## 2022-04-21 DIAGNOSIS — F330 Major depressive disorder, recurrent, mild: Secondary | ICD-10-CM | POA: Diagnosis not present

## 2022-04-21 LAB — URINE DRUG SCREEN, QUALITATIVE (ARMC ONLY)
Amphetamines, Ur Screen: NOT DETECTED
Barbiturates, Ur Screen: NOT DETECTED
Benzodiazepine, Ur Scrn: NOT DETECTED
Cannabinoid 50 Ng, Ur ~~LOC~~: POSITIVE — AB
Cocaine Metabolite,Ur ~~LOC~~: NOT DETECTED
MDMA (Ecstasy)Ur Screen: NOT DETECTED
Methadone Scn, Ur: NOT DETECTED
Opiate, Ur Screen: NOT DETECTED
Phencyclidine (PCP) Ur S: NOT DETECTED
Tricyclic, Ur Screen: NOT DETECTED

## 2022-04-21 MED ORDER — METHYLPHENIDATE HCL 5 MG PO TABS: 5.0000 mg | ORAL_TABLET | Freq: Every day | ORAL | 0 refills | Status: DC

## 2022-04-21 NOTE — Patient Instructions (Signed)
Continue bupropion 450 mg daily  Decrease Ritalin 10 mg in AM, 5 mg in PM  Obtain urine drug test Next appointment: 12/19 at 3 PM

## 2022-04-22 ENCOUNTER — Encounter: Payer: Self-pay | Admitting: Psychiatry

## 2022-05-04 ENCOUNTER — Other Ambulatory Visit: Payer: Self-pay | Admitting: Psychiatry

## 2022-05-04 ENCOUNTER — Telehealth: Payer: Self-pay

## 2022-05-04 MED ORDER — HYDROXYZINE HCL 25 MG PO TABS: 25.0000 mg | ORAL_TABLET | Freq: Every day | ORAL | 0 refills | Status: AC | PRN

## 2022-05-04 MED ORDER — METHYLPHENIDATE HCL 10 MG PO TABS: 10.0000 mg | ORAL_TABLET | Freq: Every day | ORAL | 0 refills | Status: DC

## 2022-05-04 NOTE — Telephone Encounter (Signed)
pt called tates she needs refills on the hydroxyzine and the ritalin 10mg  . pt last seen on 10-24 next appt 12-09

## 2022-05-05 NOTE — Telephone Encounter (Signed)
called patient with notification that hydroxyzine was sent in and the ritalin updated on dosages.

## 2022-05-06 ENCOUNTER — Telehealth: Payer: Self-pay

## 2022-05-06 NOTE — Telephone Encounter (Signed)
  Patient called stating that she needs a refill  Last appt 04/21/22 Next appt 06/16/22   Disp Refills Start End   buPROPion (WELLBUTRIN XL) 150 MG 24 hr tablet 270 tablet 0 03/23/2022 06/21/2022   Sig - Route: Take 3 tablets (450 mg total) by mouth daily. - Oral   Sent to pharmacy as: buPROPion (WELLBUTRIN XL) 150 MG 24 hr tablet   Notes to Pharmacy: Fill after 9/18   E-Prescribing Status: Receipt confirmed by pharmacy (03/03/2022  1:58 PM EDT)

## 2022-05-06 NOTE — Telephone Encounter (Signed)
Disp Refills Start End   buPROPion (WELLBUTRIN XL) 150 MG 24 hr tablet 270 tablet 0 03/23/2022 06/21/2022   Sig - Route: Take 3 tablets (450 mg total) by mouth daily. - Oral   Sent to pharmacy as: buPROPion (WELLBUTRIN XL) 150 MG 24 hr tablet   Notes to Pharmacy: Fill after 9/18   E-Prescribing Status: Receipt confirmed by pharmacy (03/03/2022  1:58 PM EDT)    Patient was provided 90 days supply of her medication per her provider Dr.Hisada as noted above.  She is not due for refills yet.

## 2022-05-06 NOTE — Telephone Encounter (Signed)
Called patient advise her to call the pharmacy she voiced understanding

## 2022-05-14 ENCOUNTER — Other Ambulatory Visit: Payer: Self-pay

## 2022-05-14 MED ORDER — BUPROPION HCL ER (XL) 300 MG PO TB24: 300.0000 mg | ORAL_TABLET | Freq: Every day | ORAL | 0 refills | Status: DC

## 2022-05-14 MED ORDER — BUPROPION HCL ER (XL) 150 MG PO TB24
150.0000 mg | ORAL_TABLET | Freq: Every day | ORAL | 0 refills | Status: DC
Start: 2022-05-14 — End: 2022-06-16

## 2022-05-14 NOTE — Telephone Encounter (Signed)
Defer to Dr.Hisada 

## 2022-05-14 NOTE — Telephone Encounter (Signed)
Spoke with the pharmacy. She filled bupropion 150 mg, 270 tabs in June for 90 days. The recent order was declined by insurance as they cover only up to two tabs per day. New order of 150 mg and 300 mg was ordered.  - Please contact the patient and explain  the above to make sure she takes both 150 mg and 300 mg tab.

## 2022-06-14 NOTE — Progress Notes (Unsigned)
BH MD/PA/NP OP Progress Note  06/16/2022 3:40 PM Felicia Martinez  MRN:  [id number]  Chief Complaint:  Chief Complaint  Patient presents with   Follow-up   HPI:  This is a follow-up appointment for depression and ADHD.  She states that she has been doing pretty good except she had a wisdom tooth out.  She has been taking ibuprofen.  She has flipped sleeping schedule due to feeling uncomfortable after the procedure.  She has been taking Ritalin 5 mg twice a day or once a day.  It has been helping for her to do things during the day.  She has not had palpitation except the time she feels excited during video games.  Although she feels spacey at times, it has been more manageable, and she reports good focus overall.  She sleeps 8 hours on most days except the past week. She snores at night.  She denies falling asleep in the middle of the day.  She reports decrease in appetite, and is forcing herself to eat.  She denies SI.  She stopped using weed or alcohol for the past month.  Although she has occasional temptation as she wants to relax, it has been nice not to rely on it.  She thinks she has been feeling less depressed since not using weed. She is planning to visit her boyfriend in [place] for a few months, and asks if her medication to be filled for this.   Wt Readings from Last 3 Encounters:  06/16/22 151 lb (68.5 kg)  04/21/22 155 lb 12.8 oz (70.7 kg)  04/03/22 160 lb (72.6 kg)     Visit Diagnosis:    ICD-10-CM   1. Attention deficit hyperactivity disorder (ADHD), unspecified ADHD type  F90.9     2. MDD (major depressive disorder), recurrent episode, mild (HCC)  F33.0     3. GAD (generalized anxiety disorder)  F41.1     4. Marijuana dependence (HCC)  F12.20     5. Insomnia, unspecified type  G47.00 Ambulatory referral to Pulmonology      Past Psychiatric History: Please see initial evaluation for full details. I have reviewed the history. No updates at this time.     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 I have reviewed the history. No updates at this time.     Family History:  Family History  Problem Relation Age of Onset   Hypertension Mother    Anxiety disorder Mother    Depression Mother    Lung cancer Maternal Grandmother    Schizophrenia Maternal Aunt     Social History:  Social History   Socioeconomic History   Marital status: Single    Spouse name: Not on file   Number of children: Not on file   Years of education: Not on file   Highest education level: Not on file  Occupational History   Not on file  Tobacco Use   Smoking status: Never   Smokeless tobacco: Never  Vaping Use   Vaping Use: Former   Substances: CBD  Substance and Sexual Activity   Alcohol use: Yes    Comment: occasional   Drug use: Not Currently    Types: Marijuana   Sexual activity: Yes    Birth control/protection: Condom    Comment: menarch age 71.  Lives with mom.  Parents separated.  Other Topics Concern   Not on file  Social History Narrative   In 9th grade at [employer]. She is performing good in school.  No regular exercise.  More than 3 hours of screen time per day.  Enjoys watching netflix, writing poetry and talking to friends.    Social Determinants of Health   Financial Resource Strain: Medium Risk (03/25/2022)   Overall Financial Resource Strain (CARDIA)    Difficulty of Paying Living Expenses: Somewhat hard  Food Insecurity: Food Insecurity Present (03/25/2022)   Hunger Vital Sign    Worried About Running Out of Food in the  Last Year: Often true    Ran Out of Food in the Last Year: Often true  Transportation Needs: No Transportation Needs (03/25/2022)   PRAPARE - Hydrologist (Medical): No    Lack of Transportation (Non-Medical): No  Physical Activity: Insufficiently Active (03/25/2022)   Exercise Vital Sign    Days of Exercise per Week: 3 days    Minutes of Exercise per Session: 10 min  Stress: Stress Concern Present (03/25/2022)   [place]    Feeling of Stress : Rather much  Social Connections: Socially Isolated (03/25/2022)   Social Connection and Isolation Panel [NHANES]    Frequency of Communication with Friends and Family: Three times a week    Frequency of Social Gatherings with Friends and Family: Never    Attends Religious Services: Never    Marine scientist or Organizations: No    Attends Archivist Meetings: Never    Marital Status: Never married    Allergies:  Allergies  Allergen Reactions   Other     Seasonal Allergies-pollen   Venlafaxine Hcl Nausea And Vomiting    Vomiting, hallucinations, fever-like reactions with concomitant use of bupropion    Metabolic Disorder Labs: Lab Results  Component Value Date   HGBA1C 5.0 02/16/2022   MPG 97 02/16/2022   MPG 108 08/06/2020   Lab Results  Component Value Date   PROLACTIN 8.8 01/17/2015   Lab Results  Component Value Date   CHOL 230 (H) 02/16/2022   TRIG 210 (H) 02/16/2022   HDL 48 (L) 02/16/2022   CHOLHDL 4.8 02/16/2022   VLDL 35 (H) 08/02/2015   LDLCALC 147 (H) 02/16/2022   LDLCALC 100 10/18/2019   Lab Results  Component Value Date   TSH 0.738 12/25/2021   TSH 2.280 03/17/2021    Therapeutic Level Labs: No results found for: "LITHIUM" No results found for: "VALPROATE" No results found for: "CBMZ"  Current Medications: Current Outpatient Medications  Medication Sig Dispense Refill   albuterol (VENTOLIN  HFA) 108 (90 Base) MCG/ACT inhaler Inhale 2 puffs into the lungs every 6 (six) hours as needed for wheezing or shortness of breath. 8 g 0   hydrOXYzine (ATARAX) 25 MG tablet Take 1 tablet (25 mg total) by mouth daily as needed for anxiety, nausea or vomiting. 90 tablet 0   methylphenidate (RITALIN) 5 MG tablet Take 1 tablet (5 mg total) by mouth 2 (two) times daily. 60 tablet 0   [START ON 07/16/2022] methylphenidate (RITALIN) 5 MG tablet Take 1  tablet (5 mg total) by mouth 2 (two) times daily. 60 tablet 0   [START ON 08/15/2022] methylphenidate (RITALIN) 5 MG tablet Take 1 tablet (5 mg total) by mouth 2 (two) times daily. 60 tablet 0   Omega-3 Fatty Acids (FISH OIL PO) Take by mouth.     [START ON 08/12/2022] buPROPion (WELLBUTRIN XL) 150 MG 24 hr tablet Take 1 tablet (150 mg total) by mouth daily. Take total of 450 mg daily. Take along with 300 mg tab 90 tablet 0   [START ON 08/12/2022] buPROPion (WELLBUTRIN XL) 300 MG 24 hr tablet Take 1 tablet (300 mg total) by mouth daily. Take total of 450 mg daily. Take along with 150 mg tab 90 tablet 0   No current facility-administered medications for this visit.     Musculoskeletal: Strength & Muscle Tone: within normal limits Gait & Station: normal Patient leans: N/A  Psychiatric Specialty Exam: Review of Systems  Psychiatric/Behavioral:  Positive for decreased concentration and sleep disturbance. Negative for agitation, behavioral problems, confusion, dysphoric mood, hallucinations, self-injury and suicidal ideas. The patient is nervous/anxious. The patient is not hyperactive.   All other systems reviewed and are negative.   Blood pressure 128/71, pulse 79, height 5\' 2"  (1.575 m), weight 151 lb (68.5 kg).Body mass index is 27.62 kg/m.  General Appearance: Fairly Groomed  Eye Contact:  Good  Speech:  Clear and Coherent  Volume:  Normal  Mood:   good  Affect:  Appropriate, Congruent, and calm  Thought Process:  Coherent  Orientation:  Full  (Time, Place, and Person)  Thought Content: Logical   Suicidal Thoughts:  No  Homicidal Thoughts:  No  Memory:  Immediate;   Good  Judgement:  Good  Insight:  Good  Psychomotor Activity:  Normal  Concentration:  Concentration: Good and Attention Span: Good  Recall:  Good  Fund of Knowledge: Good  Language: Good  Akathisia:  No  Handed:  Right  AIMS (if indicated): not done  Assets:  Communication Skills Desire for Improvement  ADL's:  Intact  Cognition: WNL  Sleep:  Fair   Screenings: GAD-7    Flowsheet [place] Office Visit from 06/16/2022 in [place] Visit from 04/21/2022 in [place] Office Visit from 03/25/2022 in [place] Office Visit from 01/26/2022 in [place] Office Visit from 12/21/2019 in [place]  Total GAD-7 Score 2 2 9 17 5       PHQ2-9    [place] Office Visit from 06/16/2022 in [place] Office Visit from 04/21/2022 in [place] Office Visit from 04/03/2022 in [place] Visit from 03/25/2022 in [place] Visit from 02/17/2022 in [place]  PHQ-2 Total Score 1 1 1 3  0  PHQ-9 Total Score 5 6 -- 16 --      [place] Office Visit from 06/16/2022 in [place] Office Visit from 04/21/2022 in [place] ED from 03/04/2022 in [place] Error: Question 2 not populated Error: Q3, 4, or 5 should not be populated when Q2 is No No Risk        Assessment and Plan:  Felicia Martinez is a 21 y.o. year old female with a history of depression, who presents for follow up appointment for below.   1. Attention deficit hyperactivity disorder (ADHD), unspecified ADHD type She denies any  palpitation/hypertension since  lowering the dose of Ritalin.  Will continue current dose to target ADHD.    2. MDD (major depressive disorder), recurrent episode, mild ([place]) 3. GAD (generalized anxiety disorder) There has been overall improvement in depressive symptoms and anxiety since the last visit.  She reports great relationship with her boyfriend, who is in [place].  Will continue current dose of bupropion to target depression.   4. Marijuana dependence ([place]) in early remission She has been abstinence from marijuana use for the past month.  Will continue motivational interview.   # insomnia She reports snoring, daytime fatigue.  She could not get hold of the practice she was referred.  Will make referral to another clinic for evaluation of sleep apnea.   Plan Continue bupropion 450 mg daily (filled on 11/16 for 90 days) Decrease Ritalin 5 mg twice a day (was taking 10 mg BID)  Referral for evaluation of sleep apnea Next appointment: 2/29 at 4 PM for 30 mins, in person (She will be going to [place] in a few months. She agrees to contact the office so that we can notify the pharmacy to fill medication sooner if her insurance approves this.)   This clinician has discussed the side effect associated with medication prescribed during this encounter. Please refer to notes in the previous encounters for more details.      Past trials of medication: sertraline, lexapro (anxiety), venlafaxine (vomiting/fever),  Concerta, Abilify (weight gain) Trazodone, melatonin (felt sad)   The patient demonstrates the following risk factors for suicide: Chronic risk factors for suicide include: psychiatric disorder of depression. Acute risk factors for suicide include: N/A. Protective factors for this patient include: positive social support, coping skills and hope for the future. Considering these factors, the overall suicide risk at this point appears to be low. Patient is appropriate for outpatient follow up         Collaboration of  Care: Collaboration of Care: Other reviewed notes in [place]  Patient/Guardian was advised Release of Information must be obtained prior to any record release in order to collaborate their care with an outside provider. Patient/Guardian was advised if they have not already done so to contact the registration department to sign all necessary forms in order for [place] to release information regarding their care.   Consent: Patient/Guardian gives verbal consent for treatment and assignment of benefits for services provided during this visit. Patient/Guardian expressed understanding and agreed to proceed.    Norman Clay, MD 06/16/2022, 3:40 PM

## 2022-06-16 ENCOUNTER — Encounter: Payer: Self-pay | Admitting: Psychiatry

## 2022-06-16 ENCOUNTER — Ambulatory Visit (INDEPENDENT_AMBULATORY_CARE_PROVIDER_SITE_OTHER): Payer: Medicaid Other | Admitting: Psychiatry

## 2022-06-16 VITALS — BP 128/71 | HR 79 | Ht 62.0 in | Wt 151.0 lb

## 2022-06-16 DIAGNOSIS — F909 Attention-deficit hyperactivity disorder, unspecified type: Secondary | ICD-10-CM | POA: Diagnosis not present

## 2022-06-16 DIAGNOSIS — F330 Major depressive disorder, recurrent, mild: Secondary | ICD-10-CM | POA: Diagnosis not present

## 2022-06-16 DIAGNOSIS — G4700 Insomnia, unspecified: Secondary | ICD-10-CM

## 2022-06-16 DIAGNOSIS — F1220 Cannabis dependence, uncomplicated: Secondary | ICD-10-CM | POA: Diagnosis not present

## 2022-06-16 DIAGNOSIS — F411 Generalized anxiety disorder: Secondary | ICD-10-CM | POA: Diagnosis not present

## 2022-06-16 MED ORDER — BUPROPION HCL ER (XL) 150 MG PO TB24: 150.0000 mg | ORAL_TABLET | Freq: Every day | ORAL | 0 refills | Status: DC

## 2022-06-16 MED ORDER — METHYLPHENIDATE HCL 5 MG PO TABS: 5.0000 mg | ORAL_TABLET | Freq: Two times a day (BID) | ORAL | 0 refills | Status: DC

## 2022-06-16 MED ORDER — BUPROPION HCL ER (XL) 300 MG PO TB24: 300.0000 mg | ORAL_TABLET | Freq: Every day | ORAL | 0 refills | Status: DC

## 2022-06-16 NOTE — Patient Instructions (Signed)
Continue bupropion 450 mg daily  Decrease Ritalin 5 mg twice a day  Referral for evaluation of sleep apnea Next appointment: 2/29 at 4 PM

## 2022-08-22 NOTE — Progress Notes (Unsigned)
BH MD/PA/NP OP Progress Note  08/27/2022 5:14 PM Felicia Martinez  MRN:  [id number]  Chief Complaint:  Chief Complaint  Patient presents with   Follow-up   HPI: This is a follow-up appointment for depression, anxiety and ADHD.  She states that she is doing well.  She started a job at [employer].  Today's the first day.  Although she needs to learn, she thinks it would be okay.  She is hoping to be back school in fall.  She would like a letter to support appealing to be back school as she did not follow through classes due to her lack of motivation.  She now has motivation, and she would like to do this.  She stayed in [place] for 2 months.  She is officially engaged.  Although she feels sad living there, she continues to talk with her fianc every day.  She reports conflict with the father of her fianc, who is [place].  He would like her fianc to be with a person from same place.  She has this new job to be prepared in case she needs to support financially due to this strain. The patient has mood symptoms as in PHQ-9/GAD-7.  She denies feeling depressed.  Although her mind tells her that she is going to fail, she is able to tell that it is not true.  Although she does not want to do things, she has to do things, and has been able to do.  She states that although it is intense, it is a growing period. She denies SI.  She denies alcohol use.  She uses marijuana on weekend or less.  Although she still has issues with planning, she thinks her focus is better when she takes Ritalin.  She usually takes once a day, and occasionally takes a second dose with good benefit.   Wt Readings from Last 3 Encounters:  08/27/22 160 lb 9.6 oz (72.8 kg)  06/16/22 151 lb (68.5 kg)  04/21/22 155 lb 12.8 oz (70.7 kg)    Visit Diagnosis:    ICD-10-CM   1. Attention deficit hyperactivity disorder (ADHD), unspecified ADHD type  F90.9     2. MDD (major depressive disorder), recurrent, in partial remission ([place])   F33.41     3. GAD (generalized anxiety disorder)  F41.1       Past Psychiatric History: Please see initial evaluation for full details. I have reviewed the history. No updates at this time.     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 I have reviewed the history. No updates at this time.     Family History:  Family History  Problem Relation Age of Onset   Hypertension Mother    Anxiety disorder Mother    Depression Mother    Lung cancer Maternal Grandmother    Schizophrenia Maternal Aunt     Social History:  Social History   Socioeconomic History   Marital status: Single    Spouse name: Not on file   Number of children: Not on file   Years  of education: Not on file   Highest education level: Not on file  Occupational History   Not on file  Tobacco Use   Smoking status: Never   Smokeless tobacco: Never  Vaping Use   Vaping Use: Former   Substances: CBD  Substance and Sexual Activity   Alcohol use: Yes    Comment: occasional   Drug use: Not Currently    Types: Marijuana   Sexual activity: Yes    Birth control/protection: Condom    Comment: menarch age 55.  Lives with mom.  Parents separated.  Other Topics Concern   Not on file  Social History Narrative   In 9th grade at [employer]. She is performing good in school.  No regular exercise.  More than 3 hours of screen time per day.  Enjoys watching netflix, writing poetry and talking to friends.    Social Determinants of Health   Financial Resource Strain: Medium Risk (03/25/2022)   Overall  Financial Resource Strain (CARDIA)    Difficulty of Paying Living Expenses: Somewhat hard  Food Insecurity: Food Insecurity Present (03/25/2022)   Hunger Vital Sign    Worried About Running Out of Food in the Last Year: Often true    Ran Out of Food in the Last Year: Often true  Transportation Needs: No Transportation Needs (03/25/2022)   PRAPARE - Hydrologist (Medical): No    Lack of Transportation (Non-Medical): No  Physical Activity: Insufficiently Active (03/25/2022)   Exercise Vital Sign    Days of Exercise per Week: 3 days    Minutes of Exercise per Session: 10 min  Stress: Stress Concern Present (03/25/2022)   [place]    Feeling of Stress : Rather much  Social Connections: Socially Isolated (03/25/2022)   Social Connection and Isolation Panel [NHANES]    Frequency of Communication with Friends and Family: Three times a week    Frequency of Social Gatherings with Friends and Family: Never    Attends Religious Services: Never    Marine scientist or Organizations: No    Attends Archivist Meetings: Never    Marital Status: Never married    Allergies:  Allergies  Allergen Reactions   Other     Seasonal Allergies-pollen   Venlafaxine Hcl Nausea And Vomiting    Vomiting, hallucinations, fever-like reactions with concomitant use of bupropion    Metabolic Disorder Labs: Lab Results  Component Value Date   HGBA1C 5.0 02/16/2022   MPG 97 02/16/2022   MPG 108 08/06/2020   Lab Results  Component Value Date   PROLACTIN 8.8 01/17/2015   Lab Results  Component Value Date   CHOL 230 (H) 02/16/2022   TRIG 210 (H) 02/16/2022   HDL 48 (L) 02/16/2022   CHOLHDL 4.8 02/16/2022   VLDL 35 (H) 08/02/2015   LDLCALC 147 (H) 02/16/2022   LDLCALC 100 10/18/2019   Lab Results  Component Value Date   TSH 0.738 12/25/2021   TSH 2.280 03/17/2021    Therapeutic Level  Labs: No results found for: "LITHIUM" No results found for: "VALPROATE" No results found for: "CBMZ"  Current Medications: Current Outpatient Medications  Medication Sig Dispense Refill   albuterol (VENTOLIN HFA) 108 (90 Base) MCG/ACT inhaler Inhale 2 puffs into the lungs every 6 (six) hours as needed for wheezing or shortness of breath. 8 g 0   buPROPion (WELLBUTRIN XL) 150 MG 24 hr tablet Take 1 tablet (  150 mg total) by mouth daily. Take total of 450 mg daily. Take along with 300 mg tab 90 tablet 0   buPROPion (WELLBUTRIN XL) 300 MG 24 hr tablet Take 1 tablet (300 mg total) by mouth daily. Take total of 450 mg daily. Take along with 150 mg tab 90 tablet 0   methylphenidate (RITALIN) 5 MG tablet Take 1 tablet (5 mg total) by mouth 2 (two) times daily. 60 tablet 0   Omega-3 Fatty Acids (FISH OIL PO) Take by mouth.     methylphenidate (RITALIN) 5 MG tablet Take 1 tablet (5 mg total) by mouth 2 (two) times daily. 60 tablet 0   methylphenidate (RITALIN) 5 MG tablet Take 1 tablet (5 mg total) by mouth 2 (two) times daily. 60 tablet 0   No current facility-administered medications for this visit.     Musculoskeletal: Strength & Muscle Tone: within normal limits Gait & Station: normal Patient leans: N/A  Psychiatric Specialty Exam: Review of Systems  Psychiatric/Behavioral:  Positive for decreased concentration. Negative for agitation, behavioral problems, confusion, dysphoric mood, hallucinations, self-injury, sleep disturbance and suicidal ideas. The patient is not nervous/anxious and is not hyperactive.   All other systems reviewed and are negative.   Blood pressure 137/79, pulse 95, temperature 97.9 F (36.6 C), temperature source Skin, height '5\' 2"'$  (1.575 m), weight 160 lb 9.6 oz (72.8 kg).Body mass index is 29.37 kg/m.  General Appearance: Fairly Groomed  Eye Contact:  Good  Speech:  Clear and Coherent  Volume:  Normal  Mood:   good  Affect:  Appropriate, Congruent, and Full  Range  Thought Process:  Coherent  Orientation:  Full (Time, Place, and Person)  Thought Content: Logical   Suicidal Thoughts:  No  Homicidal Thoughts:  No  Memory:  Immediate;   Good  Judgement:  Good  Insight:  Good  Psychomotor Activity:  Normal  Concentration:  Concentration: Good and Attention Span: Good  Recall:  Good  Fund of Knowledge: Good  Language: Good  Akathisia:  No  Handed:  Right  AIMS (if indicated): not done  Assets:  Communication Skills Desire for Improvement  ADL's:  Intact  Cognition: WNL  Sleep:  Fair   Screenings: GAD-7    Personnel officer Visit from 08/27/2022 in [place] Office Visit from 06/16/2022 in [place] Office Visit from 04/21/2022 in [place] Office Visit from 03/25/2022 in [facility] [place] Visit from 01/26/2022 in [place]  Total GAD-7 Score '5 2 2 9 17      '$ PHQ2-9    [place] Office Visit from 08/27/2022 in [place] Office Visit from 06/16/2022 in [place] Office Visit from 04/21/2022 in [place] Office Visit from 04/03/2022 in [place] Office Visit from [facility]/20[facility] in [facility]  PHQ-2 Total Score 0 '1 1 1 3  '$ PHQ-9 Total Score '3 5 6 '$ -- [place] Office Visit from 06/16/2022 in [place] Office Visit from 04/21/2022 in [place] ED from 03/04/2022 in [facility] Department at [place] Error: Question 2 not populated Error: Q3, 4, or 5 should not be populated when  Q2 is No No Risk         Assessment and Plan:  Felicia Martinez is a 22 y.o. year old female with a history of depression, who presents for follow up appointment for below.   1. Attention deficit hyperactivity disorder (ADHD), unspecified ADHD type Although she continues to have issues with planning, she reports significant benefit from Ritalin.  Will continue current dose to target ADHD  2. MDD (major depressive disorder), recurrent episode, in partial remission ([place]) 3. GAD (generalized anxiety disorder) Acute stressors include: conflict with her fiance's father  Other stressors include:    History:   There has been a steady improvement in depressive symptoms and anxiety since the last visit.  She reports great relationship with her fianc in [place].  Will continue current dose to target depression.    4. Marijuana dependence ([place])  She uses marijuana weekly, although she is motivated for sobriety.  Will continue motivational interview.   # insomnia She reports snoring, daytime fatigue.  She has an upcoming appointment for sleep evaluation.   Plan Continue bupropion 450 mg daily  Continue Ritalin 5 mg twice a day - two refills left Referred for evaluation of sleep apnea Next appointment: 5/23 at 3 PM, IP She would like a letter to be written to support her to be back school.  This will be provided after obtaining ROI.    This clinician has discussed the side effect associated with medication prescribed during this encounter. Please refer to notes in the previous encounters for more details.      Past trials of medication: sertraline, lexapro (anxiety), venlafaxine (vomiting/fever),  Concerta, Abilify (weight gain) Trazodone, melatonin (felt sad)   The patient demonstrates the following risk factors for suicide: Chronic risk factors for suicide include: psychiatric disorder of depression. Acute risk factors for suicide include: N/A. Protective factors for this patient include: positive social  support, coping skills and hope for the future. Considering these factors, the overall suicide risk at this point appears to be low. Patient is appropriate for outpatient follow up          Collaboration of Care: Collaboration of Care: Other reviewed notes in Epic  Patient/Guardian was advised Release of Information must be obtained prior to any record release in order to collaborate their care with an outside provider. Patient/Guardian was advised if they have not already done so to contact the registration department to sign all necessary forms in order for [place] to release information regarding their care.   Consent: Patient/Guardian gives verbal consent for treatment and assignment of benefits for services provided during this visit. Patient/Guardian expressed understanding and agreed to proceed.    Norman Clay, MD 08/27/2022, 5:14 PM

## 2022-08-27 ENCOUNTER — Ambulatory Visit (INDEPENDENT_AMBULATORY_CARE_PROVIDER_SITE_OTHER): Payer: Medicaid Other | Admitting: Psychiatry

## 2022-08-27 ENCOUNTER — Encounter: Payer: Self-pay | Admitting: Psychiatry

## 2022-08-27 VITALS — BP 137/79 | HR 95 | Temp 97.9°F | Ht 62.0 in | Wt 160.6 lb

## 2022-08-27 DIAGNOSIS — F909 Attention-deficit hyperactivity disorder, unspecified type: Secondary | ICD-10-CM

## 2022-08-27 DIAGNOSIS — F411 Generalized anxiety disorder: Secondary | ICD-10-CM

## 2022-08-27 DIAGNOSIS — F3341 Major depressive disorder, recurrent, in partial remission: Secondary | ICD-10-CM | POA: Diagnosis not present

## 2022-08-27 NOTE — Patient Instructions (Signed)
Continue bupropion 450 mg daily  Continue Ritalin 5 mg twice a day Referred for evaluation of sleep apnea Next appointment: 5/23 at 3 PM

## 2022-08-31 ENCOUNTER — Institutional Professional Consult (permissible substitution): Payer: Medicaid Other | Admitting: Pulmonary Disease

## 2022-10-02 ENCOUNTER — Ambulatory Visit (INDEPENDENT_AMBULATORY_CARE_PROVIDER_SITE_OTHER): Payer: Medicaid Other | Admitting: Pulmonary Disease

## 2022-10-02 ENCOUNTER — Encounter: Payer: Self-pay | Admitting: Pulmonary Disease

## 2022-10-02 VITALS — BP 112/63 | HR 86 | Ht 62.0 in | Wt 157.6 lb

## 2022-10-02 DIAGNOSIS — R0683 Snoring: Secondary | ICD-10-CM | POA: Diagnosis not present

## 2022-10-02 DIAGNOSIS — R053 Chronic cough: Secondary | ICD-10-CM

## 2022-10-02 NOTE — Progress Notes (Signed)
Subjective:    Patient ID: Felicia CairoEvelyn M Liptak, female    DOB: 2001/06/29, 22 y.o.   MRN: [id number]  HPI  Chief Complaint  Patient presents with   Consult    Pt consult, she states that her psychiatrist referred her because pt is having increased difficulty sleeping. She verbalized that she has ADHD   22 year old referred by psychiatry for evaluation of sleep disordered breathing. She is being evaluated by psychiatry for ADHD and anxiety and reports trouble getting to sleep and staying asleep with frequent awakenings and nonrefreshing sleep. She reports chronic daytime fatigue although reports Epworth sleepiness score low at 2.  She vapes CBD for at least 2 to 4 years. 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 Bedtime is between 11 PM to 2 AM, occasionally she takes hydroxyzine a few times a month and this helps her get to sleep quickly otherwise sleep latency can be up to 3 hours, reports 2-4 nocturnal awakenings, she sleeps on her side with cervical pillow and is out of bed close to noon time feeling tired with occasional headaches.  She took trazodone when she was in high school for insomnia but this gave her a hangover effect. -Has noted loud snoring.  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     Past Surgical History:  Procedure Laterality Date   CHOLECYSTECTOMY N/A 04/21/2021   Procedure: LAPAROSCOPIC CHOLECYSTECTOMY;  Surgeon: Franky MachoJenkins, Mark, MD;  Location: [facility];  Service: General;  Laterality: N/A;    Allergies  Allergen Reactions   Other     Seasonal Allergies-pollen   Venlafaxine Hcl Nausea And  Vomiting    Vomiting, hallucinations, fever-like reactions with concomitant use of bupropion    Social History   Socioeconomic History   Marital status: Single    Spouse name: Not on file   Number of children: Not on file   Years of education: Not on file   Highest education level: Not on file  Occupational History   Not on file  Tobacco Use   Smoking status: Never   Smokeless tobacco: Never  Vaping Use   Vaping Use: Former   Substances: CBD  Substance and Sexual Activity   Alcohol use: Yes    Comment: occasional   Drug use: Not Currently    Types: Marijuana   Sexual activity: Yes    Birth control/protection: Condom    Comment: menarch age 22.  Lives with mom.  Parents separated.  Other Topics Concern   Not on file  Social History Narrative   In 9th grade at [employer]. She is performing good in school.  No regular exercise.  More than 3 hours of screen time per day.  Enjoys watching netflix, writing poetry and talking to friends.    Social Determinants of Health   Financial Resource Strain: Medium Risk (03/25/2022)   Overall Financial Resource Strain (CARDIA)    Difficulty of Paying Living Expenses: Somewhat hard  Food Insecurity: Food Insecurity Present (03/25/2022)   Hunger Vital Sign    Worried About Running Out of Food in the  Last Year: Often true    Ran Out of Food in the Last Year: Often true  Transportation Needs: No Transportation Needs (03/25/2022)   PRAPARE - Administrator, Civil Service (Medical): No    Lack of Transportation (Non-Medical): No  Physical Activity: Insufficiently Active (03/25/2022)   Exercise Vital Sign    Days of Exercise per Week: 3 days    Minutes of Exercise per Session: 10 min  Stress: Stress Concern Present (03/25/2022)   [employer] of Occupational Health - Occupational Stress Questionnaire    Feeling of Stress : Rather much  Social Connections: Socially Isolated (03/25/2022)   Social Connection and  Isolation Panel [NHANES]    Frequency of Communication with Friends and Family: Three times a week    Frequency of Social Gatherings with Friends and Family: Never    Attends Religious Services: Never    Database administrator or Organizations: No    Attends Banker Meetings: Never    Marital Status: Never married  Intimate Partner Violence: Not At Risk (03/25/2022)   Humiliation, Afraid, Rape, and Kick questionnaire    Fear of Current or Ex-Partner: No    Emotionally Abused: No    Physically Abused: No    Sexually Abused: No    Family History  Problem Relation Age of Onset   Hypertension Mother    Anxiety disorder Mother    Depression Mother    Lung cancer Maternal Grandmother    Schizophrenia Maternal Aunt      Review of Systems Constitutional: negative for anorexia, fevers and sweats  Eyes: negative for irritation, redness and visual disturbance  Ears, nose, mouth, throat, and face: negative for earaches, epistaxis, nasal congestion and sore throat  Respiratory: negative for cough, dyspnea on exertion, sputum and wheezing  Cardiovascular: negative for chest pain, dyspnea, lower extremity edema, orthopnea, palpitations and syncope  Gastrointestinal: negative for abdominal pain, constipation, diarrhea, melena, nausea and vomiting  Genitourinary:negative for dysuria, frequency and hematuria  Hematologic/lymphatic: negative for bleeding, easy bruising and lymphadenopathy  Musculoskeletal:negative for arthralgias, muscle weakness and stiff joints  Neurological: negative for coordination problems, gait problems, headaches and weakness  Endocrine: negative for diabetic symptoms including polydipsia, polyuria and weight loss     Objective:   Physical Exam  Gen. Pleasant, well-nourished, in no distress ENT - no thrush, no pallor/icterus,no post nasal drip, class 2 airway Neck: No JVD, no thyromegaly, no carotid bruits Lungs: no use of accessory muscles, no dullness  to percussion, clear without rales or rhonchi  Cardiovascular: Rhythm regular, heart sounds  normal, no murmurs or gallops, no peripheral edema Musculoskeletal: No deformities, no cyanosis or clubbing        Assessment & Plan:

## 2022-10-02 NOTE — Patient Instructions (Addendum)
X home sleep test  OK to take melatonin 5 mg - about 2h before bedtime

## 2022-10-02 NOTE — Assessment & Plan Note (Signed)
This is likely related to vaping CBD.  I explained to her the dangers including that of ARDS that has been reported with this behavior

## 2022-10-02 NOTE — Assessment & Plan Note (Signed)
Given excessive daytime somnolence, narrow pharyngeal exam, witnessed apneas & loud snoring, obstructive sleep apnea is very likely & an overnight polysomnogram will be scheduled as a home study. The pathophysiology of obstructive sleep apnea , it's cardiovascular consequences & modes of treatment including CPAP were discused with the patient in detail & they evidenced understanding.  Pretest probably is high.  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  She would be willing to use CPAP if needed.  She has previously tried a boil and bite device and this has not worked for

## 2022-11-04 NOTE — Progress Notes (Deleted)
BH MD/PA/NP OP Progress Note  11/04/2022 5:22 PM Felicia Martinez  MRN:  [id number]  Chief Complaint: No chief complaint on file.  HPI: *** Visit Diagnosis: No diagnosis found.  Past Psychiatric History: Please see initial evaluation for full details. I have reviewed the history. No updates at this time.     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 I have reviewed the history. No updates at this time.     Family History:  Family History  Problem Relation Age of Onset   Hypertension Mother    Anxiety disorder Mother    Depression Mother    Lung cancer Maternal Grandmother    Schizophrenia Maternal Aunt     Social History:  Social History   Socioeconomic History   Marital status: Single    Spouse name: Not on file   Number of children: Not on file   Years of education: Not on file   Highest education level: Not on file  Occupational History   Not on file  Tobacco Use   Smoking status: Never   Smokeless tobacco: Never  Vaping Use   Vaping Use: Former   Substances: CBD  Substance and Sexual Activity   Alcohol use: Yes    Comment: occasional   Drug use: Not Currently    Types: Marijuana   Sexual activity: Yes    Birth control/protection: Condom    Comment: menarch age 13.  Lives with mom.  Parents separated.  Other Topics Concern   Not on file  Social History Narrative   In 9th grade at [employer]. She is performing good in school.  No regular exercise.  More than 3 hours of screen time per day.  Enjoys watching netflix, writing poetry and talking  to friends.    Social Determinants of Health   Financial Resource Strain: Medium Risk (03/25/2022)   Overall Financial Resource Strain (CARDIA)    Difficulty of Paying Living Expenses: Somewhat hard  Food Insecurity: Food Insecurity Present (03/25/2022)   Hunger Vital Sign    Worried About Running Out of Food in the Last Year: Often true    Ran Out of Food in the Last Year: Often true  Transportation Needs: No Transportation Needs (03/25/2022)   PRAPARE - Administrator, Civil Service (Medical): No    Lack of Transportation (Non-Medical): No  Physical Activity: Insufficiently Active (03/25/2022)   Exercise Vital Sign    Days of Exercise per Week: 3 days    Minutes of Exercise per Session: 10 min  Stress: Stress Concern Present (03/25/2022)   [employer] of Occupational Health - Occupational Stress Questionnaire    Feeling of Stress : Rather much  Social Connections: Socially Isolated (03/25/2022)   Social Connection and Isolation Panel [NHANES]    Frequency of Communication with Friends and Family: Three times a week    Frequency of Social Gatherings with Friends and Family: Never    Attends Religious Services: Never    Database administrator or Organizations: No    Attends Club or  Organization Meetings: Never    Marital Status: Never married    Allergies:  Allergies  Allergen Reactions   Other     Seasonal Allergies-pollen   Venlafaxine Hcl Nausea And Vomiting    Vomiting, hallucinations, fever-like reactions with concomitant use of bupropion    Metabolic Disorder Labs: Lab Results  Component Value Date   HGBA1C 5.0 02/16/2022   MPG 97 02/16/2022   MPG 108 08/06/2020   Lab Results  Component Value Date   PROLACTIN 8.8 01/17/2015   Lab Results  Component Value Date   CHOL 230 (H) 02/16/2022   TRIG 210 (H) 02/16/2022   HDL 48 (L) 02/16/2022   CHOLHDL 4.8 02/16/2022   VLDL 35 (H) 08/02/2015   LDLCALC 147 (H) 02/16/2022   LDLCALC 100 10/18/2019    Lab Results  Component Value Date   TSH 0.738 12/25/2021   TSH 2.280 03/17/2021    Therapeutic Level Labs: No results found for: "LITHIUM" No results found for: "VALPROATE" No results found for: "CBMZ"  Current Medications: Current Outpatient Medications  Medication Sig Dispense Refill   albuterol (VENTOLIN HFA) 108 (90 Base) MCG/ACT inhaler Inhale 2 puffs into the lungs every 6 (six) hours as needed for wheezing or shortness of breath. 8 g 0   buPROPion (WELLBUTRIN XL) 150 MG 24 hr tablet Take 1 tablet (150 mg total) by mouth daily. Take total of 450 mg daily. Take along with 300 mg tab 90 tablet 0   buPROPion (WELLBUTRIN XL) 300 MG 24 hr tablet Take 1 tablet (300 mg total) by mouth daily. Take total of 450 mg daily. Take along with 150 mg tab 90 tablet 0   methylphenidate (RITALIN) 5 MG tablet Take 1 tablet (5 mg total) by mouth 2 (two) times daily. 60 tablet 0   Omega-3 Fatty Acids (FISH OIL PO) Take by mouth.     No current facility-administered medications for this visit.     Musculoskeletal: Strength & Muscle Tone: within normal limits Gait & Station: normal Patient leans: N/A  Psychiatric Specialty Exam: Review of Systems  There were no vitals taken for this visit.There is no height or weight on file to calculate BMI.  General Appearance: {Appearance:22683}  Eye Contact:  {BHH EYE CONTACT:22684}  Speech:  Clear and Coherent  Volume:  Normal  Mood:  {BHH MOOD:22306}  Affect:  {Affect (PAA):22687}  Thought Process:  Coherent  Orientation:  Full (Time, Place, and Person)  Thought Content: Logical   Suicidal Thoughts:  {ST/HT (PAA):22692}  Homicidal Thoughts:  {ST/HT (PAA):22692}  Memory:  Immediate;   Good  Judgement:  {Judgement (PAA):22694}  Insight:  {Insight (PAA):22695}  Psychomotor Activity:  Normal  Concentration:  Concentration: Good and Attention Span: Good  Recall:  Good  Fund of Knowledge: Good  Language: Good  Akathisia:  No  Handed:  Right   AIMS (if indicated): not done  Assets:  Communication Skills Desire for Improvement  ADL's:  Intact  Cognition: WNL  Sleep:  {BHH GOOD/FAIR/POOR:22877}   Screenings: GAD-7    Loss adjuster, chartered Office Visit from 08/27/2022 in [facility] Office Visit from 06/16/2022 in [facility] Office Visit from 04/21/2022 in [facility] Office Visit from 03/25/2022 in [facility] Office Visit from 01/26/2022 in [facility]  Total GAD-7 Score 5 2 2 9 17       [employer]    Flowsheet Row  Office Visit from 08/27/2022 in [facility] Office Visit from 06/16/2022 in [facility] Office Visit from 04/21/2022 in [facility] Office Visit from 04/03/2022 in [facility] Office Visit from 03/25/2022 in [facility]  PHQ-2 Total Score 0 1 1 1 3   PHQ-9 Total Score 3 5 6  -- 16      Flowsheet Row Office Visit from 06/16/2022 in [facility] Office Visit from 04/21/2022 in [facility] ED from 03/04/2022 in [facility] Emergency Department at [facility]  C-SSRS RISK CATEGORY Error: Question 2 not populated Error: Q3, 4, or 5 should not be populated when Q2 is No No Risk        Assessment and Plan:  Felicia Martinez is a 22 y.o. year old female with a history of depression, who presents for follow up appointment for below.    1. Attention deficit hyperactivity disorder (ADHD), unspecified ADHD type  - UDS cannabinoid positive 03/2022 lthough she continues to have issues with planning, she reports significant benefit from Ritalin.  Will  continue current dose to target ADHD   2. MDD (major depressive disorder), recurrent episode, in partial remission (HCC) 3. GAD (generalized anxiety disorder) Acute stressors include: conflict with her fiance's father  Other stressors include:    History:   There has been a steady improvement in depressive symptoms and anxiety since the last visit.  She reports great relationship with her fianc in [place].  Will continue current dose to target depression.    4. Marijuana dependence (HCC)  She uses marijuana weekly, although she is motivated for sobriety.  Will continue motivational interview.    # insomnia She reports snoring, daytime fatigue.  She has an upcoming appointment for sleep evaluation.    Plan Continue bupropion 450 mg daily  Continue Ritalin 5 mg twice a day - two refills left Referred for evaluation of sleep apnea Next appointment: 5/23 at 3 PM, IP She would like a letter to be written to support her to be back school.  This will be provided after obtaining ROI.    This clinician has discussed the side effect associated with medication prescribed during this encounter. Please refer to notes in the previous encounters for more details.      Past trials of medication: sertraline, lexapro (anxiety), venlafaxine (vomiting/fever),  Concerta, Abilify (weight gain) Trazodone, melatonin (felt sad)   The patient demonstrates the following risk factors for suicide: Chronic risk factors for suicide include: psychiatric disorder of depression. Acute risk factors for suicide include: N/A. Protective factors for this patient include: positive social support, coping skills and hope for the future. Considering these factors, the overall suicide risk at this point appears to be low. Patient is appropriate for outpatient follow up        Collaboration of Care: Collaboration of Care: [facility] OP Collaboration of VHQI:[id number]}  Patient/Guardian was advised Release of Information must be  obtained prior to any record release in order to collaborate their care with an outside provider. Patient/Guardian was advised if they have not already done so to contact the registration department to sign all necessary forms in order for [place] to release information regarding their care.   Consent: Patient/Guardian gives verbal consent for treatment and assignment of benefits for services provided during this visit. Patient/Guardian expressed understanding and agreed to proceed.  Neysa Hotter, MD 11/04/2022, 5:22 PM

## 2022-11-05 ENCOUNTER — Telehealth: Payer: Self-pay | Admitting: Psychiatry

## 2022-11-05 ENCOUNTER — Ambulatory Visit: Payer: Medicaid Other | Admitting: Psychiatry

## 2022-11-05 ENCOUNTER — Other Ambulatory Visit: Payer: Self-pay | Admitting: Psychiatry

## 2022-11-05 MED ORDER — METHYLPHENIDATE HCL 5 MG PO TABS: 5.0000 mg | ORAL_TABLET | Freq: Two times a day (BID) | ORAL | 0 refills | Status: DC

## 2022-11-05 MED ORDER — BUPROPION HCL ER (XL) 150 MG PO TB24: 150.0000 mg | ORAL_TABLET | Freq: Every day | ORAL | 0 refills | Status: DC

## 2022-11-05 MED ORDER — BUPROPION HCL ER (XL) 300 MG PO TB24: 300.0000 mg | ORAL_TABLET | Freq: Every day | ORAL | 0 refills | Status: DC

## 2022-11-05 NOTE — Telephone Encounter (Signed)
Patient forgot about 5/9 appointment was rescheduled for July and put on waitlist. Patient requested refills on her current medications.

## 2022-12-22 NOTE — Progress Notes (Deleted)
BH MD/PA/NP OP Progress Note  12/22/2022 10:07 AM Felicia Martinez  MRN:  [id number]  Chief Complaint: No chief complaint on file.  HPI: *** Visit Diagnosis: No diagnosis found.  Past Psychiatric History: Please see initial evaluation for full details. I have reviewed the history. No updates at this time.     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 I have reviewed the history. No updates at this time.     Family History:  Family History  Problem Relation Age of Onset   Hypertension Mother    Anxiety disorder Mother    Depression Mother    Lung cancer Maternal Grandmother    Schizophrenia Maternal Aunt     Social History:  Social History   Socioeconomic History   Marital status: Single    Spouse name: Not on file   Number of children: Not on file   Years of education: Not on file   Highest education level: Not on file  Occupational History   Not on file  Tobacco Use   Smoking status: Never   Smokeless tobacco: Never  Vaping Use   Vaping Use: Former   Substances: CBD  Substance and Sexual Activity   Alcohol use: Yes    Comment: occasional   Drug use: Not Currently    Types: Marijuana   Sexual activity: Yes    Birth control/protection: Condom    Comment: menarch age 68.  Lives with mom.  Parents separated.  Other Topics Concern   Not on file  Social History Narrative   In 9th grade at [employer]. She is performing good in school.  No regular exercise.  More than 3 hours of screen time per day.  Enjoys watching netflix, writing poetry and  talking to friends.    Social Determinants of Health   Financial Resource Strain: Medium Risk (03/25/2022)   Overall Financial Resource Strain (CARDIA)    Difficulty of Paying Living Expenses: Somewhat hard  Food Insecurity: Food Insecurity Present (03/25/2022)   Hunger Vital Sign    Worried About Running Out of Food in the Last Year: Often true    Ran Out of Food in the Last Year: Often true  Transportation Needs: No Transportation Needs (03/25/2022)   PRAPARE - Administrator, Civil Service (Medical): No    Lack of Transportation (Non-Medical): No  Physical Activity: Insufficiently Active (03/25/2022)   Exercise Vital Sign    Days of Exercise per Week: 3 days    Minutes of Exercise per Session: 10 min  Stress: Stress Concern Present (03/25/2022)   [employer] of Occupational Health - Occupational Stress Questionnaire    Feeling of Stress : Rather much  Social Connections: Socially Isolated (03/25/2022)   Social Connection and Isolation Panel [NHANES]    Frequency of Communication with Friends and Family: Three times a week    Frequency of Social Gatherings with Friends and Family: Never    Attends Religious Services: Never    Database administrator or Organizations: No    Attends Club or  Organization Meetings: Never    Marital Status: Never married    Allergies:  Allergies  Allergen Reactions   Other     Seasonal Allergies-pollen   Venlafaxine Hcl Nausea And Vomiting    Vomiting, hallucinations, fever-like reactions with concomitant use of bupropion    Metabolic Disorder Labs: Lab Results  Component Value Date   HGBA1C 5.0 02/16/2022   MPG 97 02/16/2022   MPG 108 08/06/2020   Lab Results  Component Value Date   PROLACTIN 8.8 01/17/2015   Lab Results  Component Value Date   CHOL 230 (H) 02/16/2022   TRIG 210 (H) 02/16/2022   HDL 48 (L) 02/16/2022   CHOLHDL 4.8 02/16/2022   VLDL 35 (H) 08/02/2015   LDLCALC 147 (H) 02/16/2022   LDLCALC 100  10/18/2019   Lab Results  Component Value Date   TSH 0.738 12/25/2021   TSH 2.280 03/17/2021    Therapeutic Level Labs: No results found for: "LITHIUM" No results found for: "VALPROATE" No results found for: "CBMZ"  Current Medications: Current Outpatient Medications  Medication Sig Dispense Refill   albuterol (VENTOLIN HFA) 108 (90 Base) MCG/ACT inhaler Inhale 2 puffs into the lungs every 6 (six) hours as needed for wheezing or shortness of breath. 8 g 0   buPROPion (WELLBUTRIN XL) 150 MG 24 hr tablet Take 1 tablet (150 mg total) by mouth daily. Take total of 450 mg daily. Take along with 300 mg tab 90 tablet 0   buPROPion (WELLBUTRIN XL) 300 MG 24 hr tablet Take 1 tablet (300 mg total) by mouth daily. Take total of 450 mg daily. Take along with 150 mg tab 90 tablet 0   methylphenidate (RITALIN) 5 MG tablet Take 1 tablet (5 mg total) by mouth 2 (two) times daily. 60 tablet 0   methylphenidate (RITALIN) 5 MG tablet Take 1 tablet (5 mg total) by mouth 2 (two) times daily. 60 tablet 0   Omega-3 Fatty Acids (FISH OIL PO) Take by mouth.     No current facility-administered medications for this visit.     Musculoskeletal: Strength & Muscle Tone: normal Gait & Station: normal Patient leans: N/A  Psychiatric Specialty Exam: Review of Systems  There were no vitals taken for this visit.There is no height or weight on file to calculate BMI.  General Appearance: {Appearance:22683}  Eye Contact:  {BHH EYE CONTACT:22684}  Speech:  Clear and Coherent  Volume:  Normal  Mood:  {BHH MOOD:22306}  Affect:  {Affect (PAA):22687}  Thought Process:  Coherent  Orientation:  Full (Time, Place, and Person)  Thought Content: Logical   Suicidal Thoughts:  {ST/HT (PAA):22692}  Homicidal Thoughts:  {ST/HT (PAA):22692}  Memory:  Immediate;   Good  Judgement:  {Judgement (PAA):22694}  Insight:  {Insight (PAA):22695}  Psychomotor Activity:  Normal  Concentration:  Concentration: Good and Attention  Span: Good  Recall:  Good  Fund of Knowledge: Good  Language: Good  Akathisia:  No  Handed:  Right  AIMS (if indicated): not done  Assets:  Communication Skills Desire for Improvement  ADL's:  Intact  Cognition: WNL  Sleep:  {BHH GOOD/FAIR/POOR:22877}   Screenings: GAD-7    Loss adjuster, chartered Office Visit from 08/27/2022 in [facility] Office Visit from 06/16/2022 in [facility] Office Visit from 04/21/2022 in [facility] Office Visit from 03/25/2022 in [facility] for [facility] at [facility] Office Visit from 01/26/2022 in [facility]  Total GAD-7 Score 5 2 2 9 17       PHQ2-9    Flowsheet Row Office Visit from 08/27/2022 in [facility] Office Visit from 06/16/2022 in [facility] Office Visit from 04/21/2022 in [facility] Office Visit from 04/03/2022 in [facility] Office Visit from 03/25/2022 in [facility] at [facility]  PHQ-2 Total Score 0 1 1 1 3   PHQ-9 Total Score 3 5 6  -- 16      Flowsheet Row Office Visit from 06/16/2022 in [facility] Office Visit from 04/21/2022 in [facility] ED from 03/04/2022 in [facility] Emergency Department at [facility]  C-SSRS RISK CATEGORY Error: Question 2 not populated Error: Q3, 4, or 5 should not be populated when Q2 is No No Risk        Assessment and Plan:  CALLEEN ALVIS is a 22 y.o. year old female with a history of depression, who presents for follow up appointment for below.    1. Attention deficit hyperactivity disorder (ADHD), unspecified ADHD type Although she continues to have issues with  planning, she reports significant benefit from Ritalin.  Will continue current dose to target ADHD   2. MDD (major depressive disorder), recurrent episode, in partial remission (HCC) 3. GAD (generalized anxiety disorder) Acute stressors include: conflict with her fiance's father  Other stressors include:    History:   There has been a steady improvement in depressive symptoms and anxiety since the last visit.  She reports great relationship with her fianc in [place].  Will continue current dose to target depression.    4. Marijuana dependence (HCC)  She uses marijuana weekly, although she is motivated for sobriety.  Will continue motivational interview.    # insomnia She reports snoring, daytime fatigue.  She has an upcoming appointment for sleep evaluation.    Plan Continue bupropion 450 mg daily  Continue Ritalin 5 mg twice a day - two refills left Referred for evaluation of sleep apnea Next appointment: 5/23 at 3 PM, IP She would like a letter to be written to support her to be back school.  This will be provided after obtaining ROI.    This clinician has discussed the side effect associated with medication prescribed during this encounter. Please refer to notes in the previous encounters for more details.      Past trials of medication: sertraline, lexapro (anxiety), venlafaxine (vomiting/fever),  Concerta, Abilify (weight gain) Trazodone, melatonin (felt sad)   The patient demonstrates the following risk factors for suicide: Chronic risk factors for suicide include: psychiatric disorder of depression. Acute risk factors for suicide include: N/A. Protective factors for this patient include: positive social support, coping skills and hope for the future. Considering these factors, the overall suicide risk at this point appears to be low. Patient is appropriate for outpatient follow up      Collaboration of Care: Collaboration of Care: [facility] OP Collaboration of  GEXB:[id number]}  Patient/Guardian was advised Release of Information must be obtained prior to any record release in order to collaborate their care with an outside provider. Patient/Guardian was advised if they have not already done so to contact the registration department to sign all necessary forms in order for [place] to release information regarding their care.   Consent: Patient/Guardian gives verbal consent for treatment and assignment of benefits for services  provided during this visit. Patient/Guardian expressed understanding and agreed to proceed.    Neysa Hotter, MD 12/22/2022, 10:07 AM

## 2022-12-28 ENCOUNTER — Ambulatory Visit: Payer: Medicaid Other | Admitting: Psychiatry

## 2023-01-11 ENCOUNTER — Ambulatory Visit: Payer: Medicaid Other | Admitting: Psychiatry

## 2023-02-02 ENCOUNTER — Telehealth: Payer: Self-pay | Admitting: Psychiatry

## 2023-02-02 NOTE — Telephone Encounter (Signed)
Have been trying to contact patient but she has no voice mail set up. She needs an appointment scheduled.

## 2023-02-15 ENCOUNTER — Other Ambulatory Visit: Payer: Self-pay | Admitting: Psychiatry

## 2023-02-15 ENCOUNTER — Telehealth: Payer: Self-pay | Admitting: Psychiatry

## 2023-02-15 MED ORDER — METHYLPHENIDATE HCL 5 MG PO TABS: 5.0000 mg | ORAL_TABLET | Freq: Two times a day (BID) | ORAL | 0 refills | Status: DC

## 2023-02-15 NOTE — Telephone Encounter (Signed)
Patient needs a refill on methylphenidate 5 mg. Send to [facility] in [place]

## 2023-02-15 NOTE — Telephone Encounter (Signed)
Ordered

## 2023-02-27 NOTE — Progress Notes (Unsigned)
BH MD/PA/NP OP Progress Note  03/04/2023 3:12 PM Felicia Martinez  MRN:  [id number]  Chief Complaint:  Chief Complaint  Patient presents with   Follow-up   HPI:  - she is not seen since Feb.  This is a follow-up appointment for ADHD, depression. She states that she and her fianc is not together anymore.  Her fianc law is father, and he did not have time for relationship.  He went back.  Although it was hard, and it took time, she feels good about this.  She has been trying to be healthier.  She is also hoping to get financial aid through school.  They did not accept the appeal, and she has to pay to them.  She continues to have issues with procrastination.  Although she tries to clean the room, she does not know where to start.  She thinks her concentration is good after she is able to start things.  She thinks the current dose of Ritalin is a sweet spot.  She sleeps well.  She denies change in appetite.  She denies feeling depressed or anxiety.  She denies SI.  She agrees to drink less alcohol.  She uses marijuana every day.  She knows she should stop, but it is hard as it helps to be creative.  She states that she tends to be addicted to things such as a game.    Substance use  Tobacco Alcohol Other substances/  Current  Once or twice a week, four to five shots THC every day  Past     Past Treatment        Wt Readings from Last 3 Encounters:  03/04/23 162 lb 3.2 oz (73.6 kg)  10/02/22 157 lb 9.6 oz (71.5 kg)  08/27/22 160 lb 9.6 oz (72.8 kg)     Visit Diagnosis:    ICD-10-CM   1. Attention deficit hyperactivity disorder (ADHD), unspecified ADHD type  F90.9     2. MDD (major depressive disorder), recurrent, in partial remission (HCC)  F33.41     3. GAD (generalized anxiety disorder)  F41.1     4. Marijuana dependence (HCC)  F12.20       Past Psychiatric History: Please see initial evaluation for full details. I have reviewed the history. No updates at this time.      Past Medical History:  Past Medical History:  Diagnosis Date   ADHD    Allergy    Asthma    Morbid obesity (HCC)    Schizophrenia in children [facility])     Past Surgical History:  Procedure Laterality Date   CHOLECYSTECTOMY N/A 04/21/2021   Procedure: LAPAROSCOPIC CHOLECYSTECTOMY;  Surgeon: Franky Macho, MD;  Location: [facility];  Service: General;  Laterality: N/A;    Family Psychiatric History: Please see initial evaluation for full details. I have reviewed the history. No updates at this time.     Family History:  Family History  Problem Relation Age of Onset   Hypertension Mother    Anxiety disorder Mother    Depression Mother    Lung cancer Maternal Grandmother    Schizophrenia Maternal Aunt     Social History:  Social History   Socioeconomic History   Marital status: Single    Spouse name: Not on file   Number of children: Not on file   Years of education: Not on file   Highest education level: Not on file  Occupational History   Not on file  Tobacco Use   Smoking  status: Never   Smokeless tobacco: Never  Vaping Use   Vaping status: Former   Substances: CBD  Substance and Sexual Activity   Alcohol use: Yes    Comment: occasional   Drug use: Not Currently    Types: Marijuana   Sexual activity: Yes    Birth control/protection: Condom    Comment: menarch age 71.  Lives with mom.  Parents separated.  Other Topics Concern   Not on file  Social History Narrative   In 9th grade at [employer]. She is performing good in school.  No regular exercise.  More than 3 hours of screen time per day.  Enjoys watching netflix, writing poetry and talking to friends.    Social Determinants of Health   Financial Resource Strain: Medium Risk (03/25/2022)   Overall Financial Resource Strain (CARDIA)    Difficulty of Paying Living Expenses: Somewhat hard  Food Insecurity: Food Insecurity Present (03/25/2022)   Hunger Vital Sign    Worried About Running Out of  Food in the Last Year: Often true    Ran Out of Food in the Last Year: Often true  Transportation Needs: No Transportation Needs (03/25/2022)   PRAPARE - Administrator, Civil Service (Medical): No    Lack of Transportation (Non-Medical): No  Physical Activity: Insufficiently Active (03/25/2022)   Exercise Vital Sign    Days of Exercise per Week: 3 days    Minutes of Exercise per Session: 10 min  Stress: Stress Concern Present (03/25/2022)   [employer] of Occupational Health - Occupational Stress Questionnaire    Feeling of Stress : Rather much  Social Connections: Socially Isolated (03/25/2022)   Social Connection and Isolation Panel [NHANES]    Frequency of Communication with Friends and Family: Three times a week    Frequency of Social Gatherings with Friends and Family: Never    Attends Religious Services: Never    Database administrator or Organizations: No    Attends Banker Meetings: Never    Marital Status: Never married    Allergies:  Allergies  Allergen Reactions   Other     Seasonal Allergies-pollen   Venlafaxine Hcl Nausea And Vomiting    Vomiting, hallucinations, fever-like reactions with concomitant use of bupropion    Metabolic Disorder Labs: Lab Results  Component Value Date   HGBA1C 5.0 02/16/2022   MPG 97 02/16/2022   MPG 108 08/06/2020   Lab Results  Component Value Date   PROLACTIN 8.8 01/17/2015   Lab Results  Component Value Date   CHOL 230 (H) 02/16/2022   TRIG 210 (H) 02/16/2022   HDL 48 (L) 02/16/2022   CHOLHDL 4.8 02/16/2022   VLDL 35 (H) 08/02/2015   LDLCALC 147 (H) 02/16/2022   LDLCALC 100 10/18/2019   Lab Results  Component Value Date   TSH 0.738 12/25/2021   TSH 2.280 03/17/2021    Therapeutic Level Labs: No results found for: "LITHIUM" No results found for: "VALPROATE" No results found for: "CBMZ"  Current Medications: Current Outpatient Medications  Medication Sig Dispense Refill    hydrOXYzine (ATARAX) 25 MG tablet Take 25 mg by mouth daily as needed.     [START ON 04/16/2023] methylphenidate (RITALIN) 5 MG tablet Take 1 tablet (5 mg total) by mouth 2 (two) times daily. 60 tablet 0   albuterol (VENTOLIN HFA) 108 (90 Base) MCG/ACT inhaler Inhale 2 puffs into the lungs every 6 (six) hours as needed for wheezing or shortness of breath. 8  g 0   buPROPion (WELLBUTRIN XL) 150 MG 24 hr tablet Take 1 tablet (150 mg total) by mouth daily. Take total of 450 mg daily. Take along with 300 mg tab 90 tablet 0   buPROPion (WELLBUTRIN XL) 300 MG 24 hr tablet Take 1 tablet (300 mg total) by mouth daily. Take total of 450 mg daily. Take along with 150 mg tab 90 tablet 0   methylphenidate (RITALIN) 5 MG tablet Take 1 tablet (5 mg total) by mouth 2 (two) times daily. 60 tablet 0   [START ON 03/17/2023] methylphenidate (RITALIN) 5 MG tablet Take 1 tablet (5 mg total) by mouth 2 (two) times daily. 60 tablet 0   Omega-3 Fatty Acids (FISH OIL PO) Take by mouth.     No current facility-administered medications for this visit.     Musculoskeletal: Strength & Muscle Tone: within normal limits Gait & Station: normal Patient leans: N/A  Psychiatric Specialty Exam: Review of Systems  Psychiatric/Behavioral:  Positive for decreased concentration. Negative for agitation, behavioral problems, confusion, dysphoric mood, hallucinations, self-injury, sleep disturbance and suicidal ideas. The patient is not nervous/anxious and is not hyperactive.   All other systems reviewed and are negative.   Blood pressure 106/70, pulse 81, temperature 97.7 F (36.5 C), temperature source Skin, height 5\' 2"  (1.575 m), weight 162 lb 3.2 oz (73.6 kg).Body mass index is 29.67 kg/m.  General Appearance: Fairly Groomed  Eye Contact:  Good  Speech:  Clear and Coherent  Volume:  Normal  Mood:   good  Affect:  Appropriate, Congruent, and Full Range  Thought Process:  Coherent  Orientation:  Full (Time, Place, and  Person)  Thought Content: Logical   Suicidal Thoughts:  No  Homicidal Thoughts:  No  Memory:  Immediate;   Good  Judgement:  Good  Insight:  Good  Psychomotor Activity:  Normal  Concentration:  Concentration: Good and Attention Span: Good  Recall:  Good  Fund of Knowledge: Good  Language: Good  Akathisia:  No  Handed:  Right  AIMS (if indicated): not done  Assets:  Communication Skills Desire for Improvement  ADL's:  Intact  Cognition: WNL  Sleep:  Good   Screenings: GAD-7    Flowsheet Row Office Visit from 08/27/2022 in [facility] Office Visit from 06/16/2022 in [facility] Office Visit from 04/21/2022 in [facility] Office Visit from 03/25/2022 in [facility] at [facility] Office Visit from 01/26/2022 in [facility]  Total GAD-7 Score 5 2 2 9 17       PHQ2-9    Flowsheet Row Office Visit from 08/27/2022 in [facility] Office Visit from 06/16/2022 in [facility] Office Visit from 04/21/2022 in [facility] Office Visit from 04/03/2022 in [facility] Office Visit from 03/25/2022 in [facility] Healthcare at [facility]  PHQ-2 Total Score 0 1 1 1 3   PHQ-9 Total Score 3 5 6  -- 16      Flowsheet Row Office Visit from 06/16/2022 in [facility] Office Visit from 04/21/2022 in [facility] ED from 03/04/2022 in [facility] Emergency Department at [facility]  C-SSRS RISK CATEGORY Error: Question 2 not populated Error: Q3, 4, or 5 should not be populated when Q2 is No No  Risk        Assessment and Plan:  Felicia Martinez is a 22 y.o. year  old female with a history of depression, who presents for follow up appointment for below.   1. Attention deficit hyperactivity disorder (ADHD), unspecified ADHD type She continues to experience procrastination, she reports good benefit from Ritalin.  Will continue current dose of Ritalin to target ADHD.  It has been discussed with the patient that if she were to continue using marijuana, this writer will not be able to continue to prescribe Ritalin.  She has not undergone neuropsychological testing due to issues with insurance.  She expressed understanding of this.   2. MDD (major depressive disorder), recurrent, in partial remission (HCC) 3. GAD (generalized anxiety disorder) Acute stressors include: broke up with her fiance Other stressors include:    History:   She reports overall stable mood since last visit.  Will continue current dose of bupropion to target depression.   4. Marijuana dependence (HCC) It has been repeatedly discussed to be abstinent from marijuana use due to its possible impact on her inattention.  She expressed understanding that retirement will not be continued if she were to continue marijuana.    # insomnia She reports snoring, daytime fatigue.  Although she was informed of conflicts testing, she has not heard back from the clinic.  She agrees to contact the clinic to pursue this   Plan Continue bupropion 450 mg daily  Continue Ritalin 5 mg twice a day  Next appointment: 11/18 at 4 pm - discussed attending policy   This clinician has discussed the side effect associated with medication prescribed during this encounter. Please refer to notes in the previous encounters for more details.      Past trials of medication: sertraline, lexapro (anxiety), venlafaxine (vomiting/fever),  Concerta, Abilify (weight gain) Trazodone, melatonin (felt sad)   The patient demonstrates the following risk factors for suicide: Chronic risk factors for suicide include: psychiatric  disorder of depression. Acute risk factors for suicide include: N/A. Protective factors for this patient include: positive social support, coping skills and hope for the future. Considering these factors, the overall suicide risk at this point appears to be low. Patient is appropriate for outpatient follow up        Collaboration of Care: Collaboration of Care: Other reviewed notes in Epic  Patient/Guardian was advised Release of Information must be obtained prior to any record release in order to collaborate their care with an outside provider. Patient/Guardian was advised if they have not already done so to contact the registration department to sign all necessary forms in order for [place] to release information regarding their care.   Consent: Patient/Guardian gives verbal consent for treatment and assignment of benefits for services provided during this visit. Patient/Guardian expressed understanding and agreed to proceed.   I have utilized the [place] Controlled Substances Reporting System (PMP AWARxE) to confirm adherence regarding the patient's medication. My review reveals appropriate prescription fills.    Neysa Hotter, MD 03/04/2023, 3:12 PM

## 2023-03-04 ENCOUNTER — Ambulatory Visit (INDEPENDENT_AMBULATORY_CARE_PROVIDER_SITE_OTHER): Payer: Medicaid Other | Admitting: Psychiatry

## 2023-03-04 ENCOUNTER — Encounter: Payer: Self-pay | Admitting: Psychiatry

## 2023-03-04 ENCOUNTER — Encounter: Payer: Self-pay | Admitting: Pulmonary Disease

## 2023-03-04 VITALS — BP 106/70 | HR 81 | Temp 97.7°F | Ht 62.0 in | Wt 162.2 lb

## 2023-03-04 DIAGNOSIS — F909 Attention-deficit hyperactivity disorder, unspecified type: Secondary | ICD-10-CM

## 2023-03-04 DIAGNOSIS — F411 Generalized anxiety disorder: Secondary | ICD-10-CM

## 2023-03-04 DIAGNOSIS — F3341 Major depressive disorder, recurrent, in partial remission: Secondary | ICD-10-CM

## 2023-03-04 DIAGNOSIS — R0683 Snoring: Secondary | ICD-10-CM

## 2023-03-04 DIAGNOSIS — F1220 Cannabis dependence, uncomplicated: Secondary | ICD-10-CM | POA: Diagnosis not present

## 2023-03-04 MED ORDER — BUPROPION HCL ER (XL) 150 MG PO TB24: 150.0000 mg | ORAL_TABLET | Freq: Every day | ORAL | 0 refills | Status: DC

## 2023-03-04 MED ORDER — BUPROPION HCL ER (XL) 300 MG PO TB24: 300.0000 mg | ORAL_TABLET | Freq: Every day | ORAL | 0 refills | Status: DC

## 2023-03-04 MED ORDER — METHYLPHENIDATE HCL 5 MG PO TABS: 5.0000 mg | ORAL_TABLET | Freq: Two times a day (BID) | ORAL | 0 refills | Status: DC

## 2023-03-04 NOTE — Patient Instructions (Signed)
Continue bupropion 450 mg daily  Continue Ritalin 5 mg twice a day  Next appointment: 11/18 at 4 pm

## 2023-03-31 ENCOUNTER — Ambulatory Visit: Payer: Medicaid Other

## 2023-03-31 DIAGNOSIS — R0683 Snoring: Secondary | ICD-10-CM

## 2023-04-03 ENCOUNTER — Other Ambulatory Visit: Payer: Self-pay | Admitting: Psychiatry

## 2023-04-09 DIAGNOSIS — R0683 Snoring: Secondary | ICD-10-CM | POA: Diagnosis not present

## 2023-04-23 ENCOUNTER — Other Ambulatory Visit: Payer: Self-pay | Admitting: Family Medicine

## 2023-05-12 ENCOUNTER — Other Ambulatory Visit: Payer: Self-pay

## 2023-05-12 DIAGNOSIS — R053 Chronic cough: Secondary | ICD-10-CM

## 2023-05-12 MED ORDER — ALBUTEROL SULFATE HFA 108 (90 BASE) MCG/ACT IN AERS: 2.0000 | INHALATION_SPRAY | Freq: Four times a day (QID) | RESPIRATORY_TRACT | 0 refills | Status: DC | PRN

## 2023-05-14 NOTE — Progress Notes (Unsigned)
BH MD/PA/NP OP Progress Note  05/17/2023 5:29 PM Felicia Martinez  MRN:  [id number]  Chief Complaint:  Chief Complaint  Patient presents with   Follow-up   HPI:  This is a follow-up appointment for depression, anxiety, ADHD and insomnia.  She states that she quit marijuana a few weeks ago.  She is more in control of herself.  Although she did not have desire to change, she now has it, and she has been going to the gym every day, not staying late, and video games.  She eats healthy diet.  She thinks her focus has been a lot better, although she may still misplace things at times. she appreciates the push she had at the last visit so that she can quit marijuana. She feels happier. The only concern is that she has initial insomnia.  Her mind does not shut down.  She plays video game until she go to the bed, although she reduced a lot compared to before.  She agrees to try limiting any bruise-like 2 hours prior to going to bed.  She denies feeling depressed.  She denies anxiety.  She continues to take Ritalin every day, although she may occasionally not takes evening dose.  She denies SI.  He agrees with the plan as outlined below.   Wt Readings from Last 3 Encounters:  05/17/23 160 lb (72.6 kg)  03/04/23 162 lb 3.2 oz (73.6 kg)  10/02/22 157 lb 9.6 oz (71.5 kg)    Visit Diagnosis:    ICD-10-CM   1. MDD (major depressive disorder), recurrent, in partial remission (HCC)  F33.41     2. GAD (generalized anxiety disorder)  F41.1     3. Attention deficit hyperactivity disorder (ADHD), unspecified ADHD type  F90.9     4. Marijuana abuse in remission  F12.11       Past Psychiatric History: Please see initial evaluation for full details. I have reviewed the history. No updates at this time.     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 I have reviewed the history. No updates at this time.     Family History:  Family History  Problem Relation Age of Onset   Hypertension Mother    Anxiety disorder Mother    Depression Mother    Lung cancer Maternal Grandmother    Schizophrenia Maternal Aunt     Social History:  Social History   Socioeconomic History   Marital status: Single    Spouse name: Not on file   Number of children: Not on file   Years of education: Not on file   Highest education level: Not on file  Occupational History   Not on file  Tobacco Use   Smoking status: Never   Smokeless tobacco: Never  Vaping Use   Vaping status: Former   Substances: CBD  Substance and Sexual Activity   Alcohol use: Yes    Comment: occasional   Drug use: Not Currently    Types: Marijuana   Sexual activity: Yes    Birth control/protection: Condom  Comment: menarch age 4.  Lives with mom.  Parents separated.  Other Topics Concern   Not on file  Social History Narrative   In 9th grade at [employer]. She is performing good in school.  No regular exercise.  More than 3 hours of screen time per day.  Enjoys watching netflix, writing poetry and talking to friends.    Social Determinants of Health   Financial Resource Strain: Medium Risk (03/25/2022)   Overall Financial Resource Strain (CARDIA)    Difficulty of Paying Living Expenses: Somewhat hard  Food Insecurity: Food Insecurity Present (03/25/2022)   Hunger Vital Sign    Worried About Running Out of Food in the Last Year: Often true    Ran Out of Food in the Last Year: Often true  Transportation Needs: No Transportation Needs (03/25/2022)   PRAPARE - Administrator, Civil Service  (Medical): No    Lack of Transportation (Non-Medical): No  Physical Activity: Insufficiently Active (03/25/2022)   Exercise Vital Sign    Days of Exercise per Week: 3 days    Minutes of Exercise per Session: 10 min  Stress: Stress Concern Present (03/25/2022)   [employer] of Occupational Health - Occupational Stress Questionnaire    Feeling of Stress : Rather much  Social Connections: Socially Isolated (03/25/2022)   Social Connection and Isolation Panel [NHANES]    Frequency of Communication with Friends and Family: Three times a week    Frequency of Social Gatherings with Friends and Family: Never    Attends Religious Services: Never    Database administrator or Organizations: No    Attends Banker Meetings: Never    Marital Status: Never married    Allergies:  Allergies  Allergen Reactions   Other     Seasonal Allergies-pollen   Venlafaxine Hcl Nausea And Vomiting    Vomiting, hallucinations, fever-like reactions with concomitant use of bupropion    Metabolic Disorder Labs: Lab Results  Component Value Date   HGBA1C 5.0 02/16/2022   MPG 97 02/16/2022   MPG 108 08/06/2020   Lab Results  Component Value Date   PROLACTIN 8.8 01/17/2015   Lab Results  Component Value Date   CHOL 230 (H) 02/16/2022   TRIG 210 (H) 02/16/2022   HDL 48 (L) 02/16/2022   CHOLHDL 4.8 02/16/2022   VLDL 35 (H) 08/02/2015   LDLCALC 147 (H) 02/16/2022   LDLCALC 100 10/18/2019   Lab Results  Component Value Date   TSH 0.738 12/25/2021   TSH 2.280 03/17/2021    Therapeutic Level Labs: No results found for: "LITHIUM" No results found for: "VALPROATE" No results found for: "CBMZ"  Current Medications: Current Outpatient Medications  Medication Sig Dispense Refill   albuterol (VENTOLIN HFA) 108 (90 Base) MCG/ACT inhaler Inhale 2 puffs into the lungs every 6 (six) hours as needed for wheezing or shortness of breath. 8 g 0   hydrOXYzine (ATARAX) 25 MG tablet Take 25 mg by  mouth daily as needed.     Omega-3 Fatty Acids (FISH OIL PO) Take by mouth.     [START ON 06/02/2023] buPROPion (WELLBUTRIN XL) 150 MG 24 hr tablet Take 1 tablet (150 mg total) by mouth daily. Take total of 450 mg daily. Take along with 300 mg tab 90 tablet 1   [START ON 06/02/2023] buPROPion (WELLBUTRIN XL) 300 MG 24 hr tablet Take 1 tablet (300 mg total) by mouth daily. Take total of 450 mg daily. Take along with 150  mg tab 90 tablet 1   methylphenidate (RITALIN) 5 MG tablet Take 1 tablet (5 mg total) by mouth 2 (two) times daily. 60 tablet 0   [START ON 06/16/2023] methylphenidate (RITALIN) 5 MG tablet Take 1 tablet (5 mg total) by mouth 2 (two) times daily. 60 tablet 0   [START ON 07/16/2023] methylphenidate (RITALIN) 5 MG tablet Take 1 tablet (5 mg total) by mouth 2 (two) times daily. 60 tablet 0   No current facility-administered medications for this visit.     Musculoskeletal: Strength & Muscle Tone: within normal limits Gait & Station: normal Patient leans: N/A  Psychiatric Specialty Exam: Review of Systems  Psychiatric/Behavioral:  Positive for sleep disturbance. Negative for agitation, behavioral problems, confusion, decreased concentration, dysphoric mood, hallucinations, self-injury and suicidal ideas. The patient is not nervous/anxious and is not hyperactive.   All other systems reviewed and are negative.   Blood pressure 111/76, pulse 88, temperature (!) 97.4 F (36.3 C), temperature source Skin, height 5\' 2"  (1.575 m), weight 160 lb (72.6 kg).Body mass index is 29.26 kg/m.  General Appearance: Well Groomed  Eye Contact:  Good  Speech:  Clear and Coherent  Volume:  Normal  Mood:   better  Affect:  Appropriate, Congruent, and Full Range  Thought Process:  Coherent  Orientation:  Full (Time, Place, and Person)  Thought Content: Logical   Suicidal Thoughts:  No  Homicidal Thoughts:  No  Memory:  Immediate;   Good  Judgement:  Good  Insight:  Good  Psychomotor Activity:   Normal  Concentration:  Concentration: Good and Attention Span: Good  Recall:  Good  Fund of Knowledge: Good  Language: Good  Akathisia:  No  Handed:  Right  AIMS (if indicated): not done  Assets:  Communication Skills Desire for Improvement  ADL's:  Intact  Cognition: WNL  Sleep:  Fair   Screenings: GAD-7    Garment/textile technologist Visit from 08/27/2022 in [facility] Office Visit from 06/16/2022 in [facility] Office Visit from 04/21/2022 in [facility] Office Visit from 03/25/2022 in [facility] Office Visit from 01/26/2022 in [facility]  Total GAD-7 Score 5 2 2 9 17       PHQ2-9    Flowsheet Row Office Visit from 08/27/2022 in [facility] Office Visit from 06/16/2022 in [facility] Office Visit from 04/21/2022 in [facility] Office Visit from 04/03/2022 in [facility] Office Visit from 03/25/2022 in [facility]  PHQ-2 Total Score 0 1 1 1 3   PHQ-9 Total Score 3 5 6  -- 16      Flowsheet Row Office Visit from 06/16/2022 in [facility] Office Visit from 04/21/2022 in [facility] ED from 03/04/2022 in [facility] Department at [facility]  C-SSRS RISK CATEGORY Error: Question 2 not populated Error: Q3, 4, or 5 should not be populated when Q2 is No No Risk        Assessment and Plan:  IDONNA KNOCHE is a 22 y.o. year old female with a history of depression, who presents for follow up appointment for below.     1. MDD (major depressive disorder), recurrent, in partial remission (HCC) 2.  GAD (generalized anxiety  disorder) Acute stressors include: broke up with her fiance Other stressors include:    History:   She reports significant improvement since discontinuation of marijuana.  Will continue current dose of bupropion to target depression.   3. Attention deficit hyperactivity disorder (ADHD), unspecified ADHD type - could not afford neuropsych testing She reports significant improvement in concentration, procrastination since discontinuation of marijuana.  Will continue current dose of Ritalin to target ADHD.   4. Marijuana use in partial remission She has been abstinent for the past few weeks.  Will continue motivational interviewing.   # Insomnia She was reportedly had sleep testing, which was negative for sleep apnea.  Provided psycho education about sleep hygiene, such as limiting video games late in the day.  Will continue to assess.   Plan Continue bupropion 450 mg daily  Continue Ritalin 5 mg twice a day  Next appointment: 2/10 at 4:30, IP - discussed attending policy   Past trials of medication: sertraline, lexapro (anxiety), venlafaxine (vomiting/fever),  Concerta, Abilify (weight gain) Trazodone, melatonin (felt sad)   The patient demonstrates the following risk factors for suicide: Chronic risk factors for suicide include: psychiatric disorder of depression. Acute risk factors for suicide include: N/A. Protective factors for this patient include: positive social support, coping skills and hope for the future. Considering these factors, the overall suicide risk at this point appears to be low. Patient is appropriate for outpatient follow up      Collaboration of Care: Collaboration of Care: Other reviewed notes in Epic  Patient/Guardian was advised Release of Information must be obtained prior to any record release in order to collaborate their care with an outside provider. Patient/Guardian was advised if they have not already done so to contact the  registration department to sign all necessary forms in order for [place] to release information regarding their care.   Consent: Patient/Guardian gives verbal consent for treatment and assignment of benefits for services provided during this visit. Patient/Guardian expressed understanding and agreed to proceed.    Neysa Hotter, MD 05/17/2023, 5:29 PM

## 2023-05-17 ENCOUNTER — Ambulatory Visit (INDEPENDENT_AMBULATORY_CARE_PROVIDER_SITE_OTHER): Payer: Medicaid Other | Admitting: Psychiatry

## 2023-05-17 ENCOUNTER — Encounter: Payer: Self-pay | Admitting: Psychiatry

## 2023-05-17 VITALS — BP 111/76 | HR 88 | Temp 97.4°F | Ht 62.0 in | Wt 160.0 lb

## 2023-05-17 DIAGNOSIS — F1211 Cannabis abuse, in remission: Secondary | ICD-10-CM | POA: Diagnosis not present

## 2023-05-17 DIAGNOSIS — F3341 Major depressive disorder, recurrent, in partial remission: Secondary | ICD-10-CM | POA: Diagnosis not present

## 2023-05-17 DIAGNOSIS — F909 Attention-deficit hyperactivity disorder, unspecified type: Secondary | ICD-10-CM | POA: Diagnosis not present

## 2023-05-17 DIAGNOSIS — F411 Generalized anxiety disorder: Secondary | ICD-10-CM

## 2023-05-17 MED ORDER — METHYLPHENIDATE HCL 5 MG PO TABS: 5.0000 mg | ORAL_TABLET | Freq: Two times a day (BID) | ORAL | 0 refills | Status: DC

## 2023-05-17 MED ORDER — BUPROPION HCL ER (XL) 300 MG PO TB24: 300.0000 mg | ORAL_TABLET | Freq: Every day | ORAL | 1 refills | Status: DC

## 2023-05-17 MED ORDER — BUPROPION HCL ER (XL) 150 MG PO TB24: 150.0000 mg | ORAL_TABLET | Freq: Every day | ORAL | 1 refills | Status: DC

## 2023-05-17 NOTE — Patient Instructions (Signed)
Continue bupropion 450 mg daily  Continue Ritalin 5 mg twice a day  Next appointment: 2/10 at 4:30

## 2023-05-20 ENCOUNTER — Ambulatory Visit (INDEPENDENT_AMBULATORY_CARE_PROVIDER_SITE_OTHER): Payer: Medicaid Other | Admitting: Family Medicine

## 2023-05-20 VITALS — HR 96 | Temp 98.4°F | Ht 62.0 in | Wt 157.5 lb

## 2023-05-20 DIAGNOSIS — N760 Acute vaginitis: Secondary | ICD-10-CM | POA: Diagnosis not present

## 2023-05-20 LAB — WET PREP FOR TRICH, YEAST, CLUE

## 2023-05-20 MED ORDER — METRONIDAZOLE 500 MG PO TABS: 500.0000 mg | ORAL_TABLET | Freq: Two times a day (BID) | ORAL | 0 refills | Status: AC

## 2023-05-20 NOTE — Progress Notes (Signed)
Subjective:    Patient ID: Felicia Martinez, female    DOB: 2001/02/27, 22 y.o.   MRN: [id number]  Vaginal Discharge The patient's primary symptoms include vaginal discharge.  Vaginal Itching The patient's primary symptoms include vaginal discharge.  Patient presents with a 2-week history of vaginal discharge and vaginal itching.  She states that the discharge has a foul odor.  She is sexually active.  She denies any pelvic pain.  She denies any nausea or vomiting.  She has not missed a period. 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 8 g 0   [START ON 06/02/2023] buPROPion (WELLBUTRIN XL) 300 MG 24 hr tablet Take 1 tablet (300 mg total) by mouth daily. Take total of 450 mg daily. Take along with 150 mg tab 90 tablet 1   hydrOXYzine (ATARAX) 25 MG tablet Take 25 mg by mouth daily as needed.     methylphenidate (RITALIN) 5 MG tablet Take 1 tablet (5 mg total) by mouth 2 (two) times daily. 60 tablet 0   Omega-3 Fatty Acids (FISH OIL PO) Take by mouth.     [START ON 06/02/2023] buPROPion (WELLBUTRIN XL) 150 MG 24 hr tablet Take 1 tablet (150 mg total) by mouth daily. Take total of 450 mg daily. Take along with 300 mg tab 90 tablet 1   No current facility-administered medications on file prior to visit.   Allergies  Allergen Reactions   Other     Seasonal Allergies-pollen   Venlafaxine Hcl Nausea And Vomiting    Vomiting, hallucinations, fever-like  reactions with concomitant use of bupropion   Social History   Socioeconomic History   Marital status: Single    Spouse name: Not on file   Number of children: Not on file   Years of education: Not on file   Highest education level: Not on file  Occupational History   Not on file  Tobacco Use   Smoking status: Never   Smokeless tobacco: Never  Vaping Use   Vaping status: Former   Substances: CBD  Substance and Sexual Activity   Alcohol use: Yes    Comment: occasional   Drug use: Not Currently    Types: Marijuana   Sexual activity: Yes    Birth control/protection: Condom    Comment: menarch age 42.  Lives with mom.  Parents separated.  Other Topics Concern   Not on file  Social History Narrative   In 9th grade at [employer]. She is performing good in school.  No regular exercise.  More than 3 hours of screen time per day.  Enjoys watching netflix, writing poetry and talking to friends.    Social Determinants of Health   Financial Resource Strain: Medium Risk (03/25/2022)   Overall Financial Resource Strain (CARDIA)    Difficulty of Paying Living Expenses: Somewhat hard  Food Insecurity: Food Insecurity Present (03/25/2022)   Hunger Vital Sign  Worried About Programme researcher, broadcasting/film/video in the Last Year: Often true    [employer] of Food in the Last Year: Often true  Transportation Needs: No Transportation Needs (03/25/2022)   PRAPARE - Administrator, Civil Service (Medical): No    Lack of Transportation (Non-Medical): No  Physical Activity: Insufficiently Active (03/25/2022)   Exercise Vital Sign    Days of Exercise per Week: 3 days    Minutes of Exercise per Session: 10 min  Stress: Stress Concern Present (03/25/2022)   [employer] of Occupational Health - Occupational Stress Questionnaire    Feeling of Stress : Rather much  Social Connections: Socially Isolated (03/25/2022)   Social Connection and Isolation Panel [NHANES]    Frequency of  Communication with Friends and Family: Three times a week    Frequency of Social Gatherings with Friends and Family: Never    Attends Religious Services: Never    Database administrator or Organizations: No    Attends Banker Meetings: Never    Marital Status: Never married  Intimate Partner Violence: Not At Risk (03/25/2022)   Humiliation, Afraid, Rape, and Kick questionnaire    Fear of Current or Ex-Partner: No    Emotionally Abused: No    Physically Abused: No    Sexually Abused: No      Review of Systems  Genitourinary:  Positive for vaginal discharge.  All other systems reviewed and are negative.      Objective:   Physical Exam Exam conducted with a chaperone present.  Constitutional:      Appearance: Normal appearance. She is obese.  HENT:     Nose: No congestion or rhinorrhea.     Mouth/Throat:     Mouth: Mucous membranes are moist.     Pharynx: Oropharynx is clear. No oropharyngeal exudate or posterior oropharyngeal erythema.  Eyes:     Conjunctiva/sclera: Conjunctivae normal.  Cardiovascular:     Rate and Rhythm: Normal rate and regular rhythm.     Heart sounds: Normal heart sounds. No murmur heard.    No gallop.  Pulmonary:     Effort: Pulmonary effort is normal. No respiratory distress.     Breath sounds: Normal breath sounds.  Genitourinary:    Exam position: Lithotomy position.     Labia:        Right: No rash.        Left: No rash.      Cervix: Discharge present. No cervical motion tenderness, friability or erythema.     Uterus: Normal.      Adnexa: Right adnexa normal and left adnexa normal.  Musculoskeletal:     Cervical back: Neck supple.     Right lower leg: No edema.     Left lower leg: No edema.  Neurological:     Mental Status: She is alert.           Assessment & Plan:  Acute vaginitis - Plan: WET PREP FOR TRICH, YEAST, CLUE, C. trachomatis/N. gonorrhoeae RNA Will screen the patient for gonorrhea and chlamydia.   Meanwhile we will also check a wet prep to evaluate for bacterial vaginosis as well as a yeast infection.  Wet prep shows BV.  Flagyl 500 mg pobid for 7 days

## 2023-05-21 LAB — C. TRACHOMATIS/N. GONORRHOEAE RNA
C. trachomatis RNA, TMA: NOT DETECTED
N. gonorrhoeae RNA, TMA: NOT DETECTED

## 2023-05-25 IMAGING — CT CT ABD-PELV W/ CM
2 of 7 series · 16 of 46 positions shown, 18 images · IV contrast (Omnipaque or Isovue)
Comparison: None.

CLINICAL DATA: Nausea, vomiting, mid abdominal pain

EXAM:
CT ABDOMEN AND PELVIS WITH CONTRAST
TECHNIQUE: Multidetector CT imaging of the abdomen and pelvis was performed
using the standard protocol following bolus administration of
intravenous contrast.
CONTRAST:  85mL OMNIPAQUE IOHEXOL 350 MG/ML SOLN

[Series 2: axial st · axial · 0.82mm/px · z∈[+838,+1263]mm · 13 of 97 slices shown, 15 images]
[im 6/97  soft-tissue]
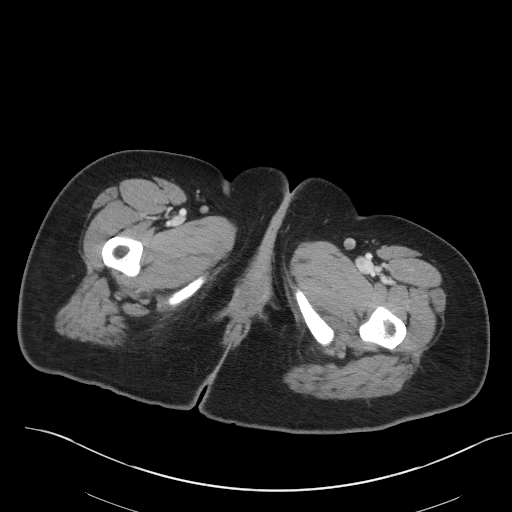
[im 6/97  bone]
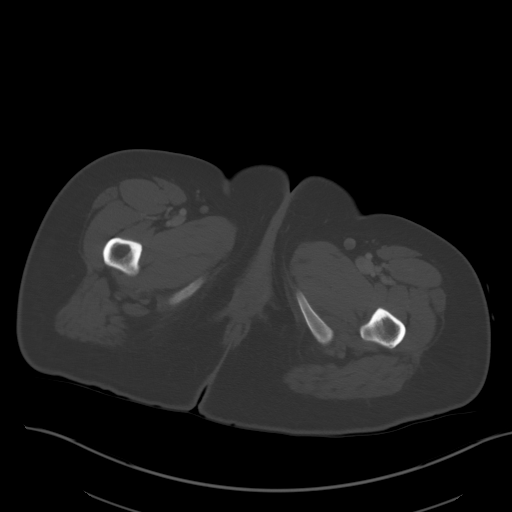
[im 11/97  soft-tissue]
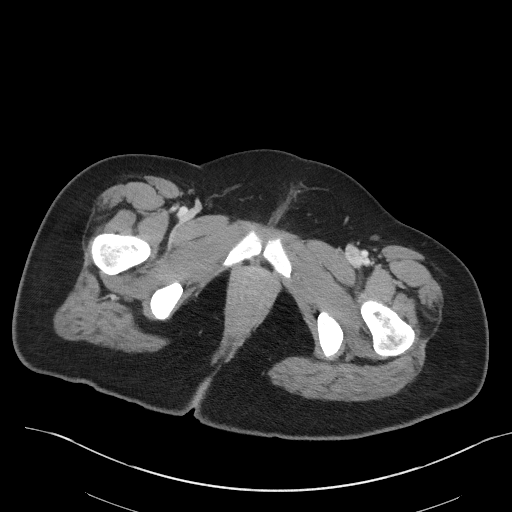
[im 22/97  soft-tissue]
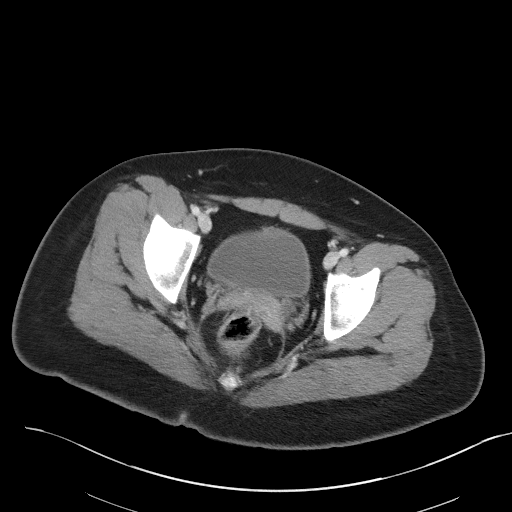
[im 27/97  soft-tissue]
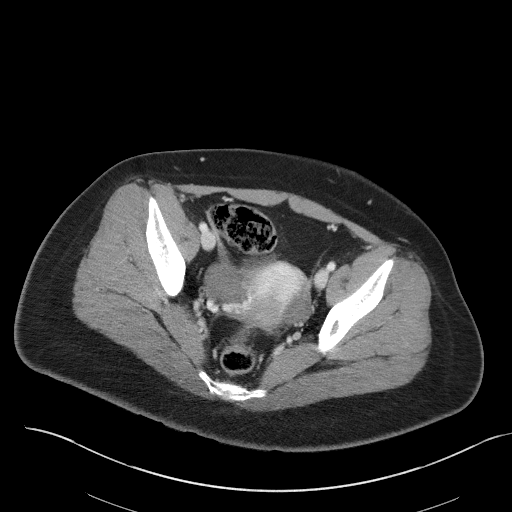
[im 33/97  soft-tissue]
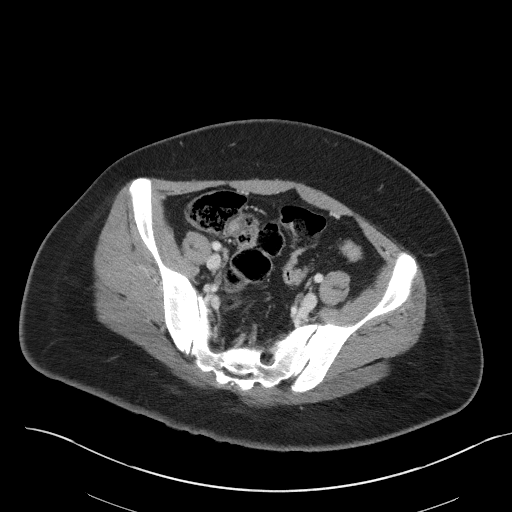
[im 43/97  soft-tissue]
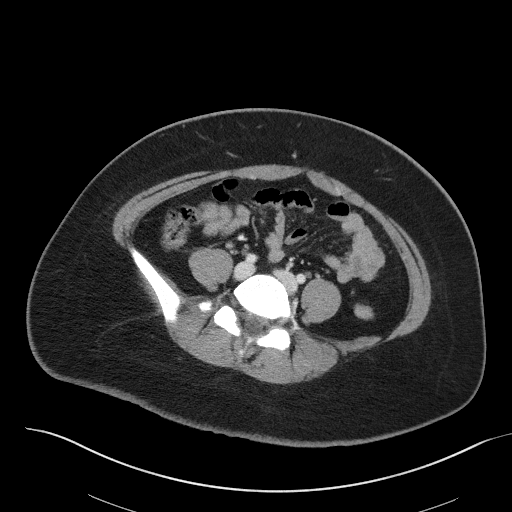
[im 49/97  soft-tissue]
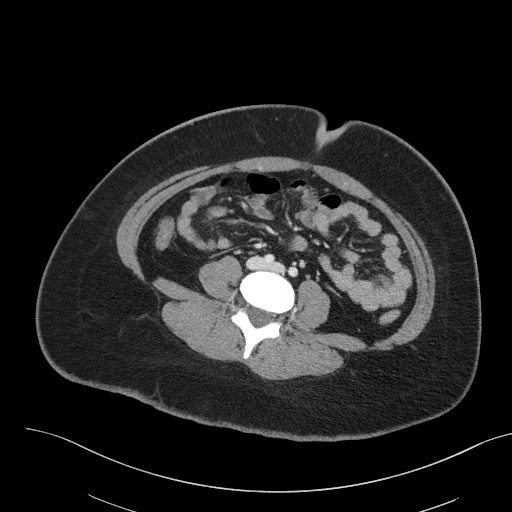
[im 54/97  soft-tissue]
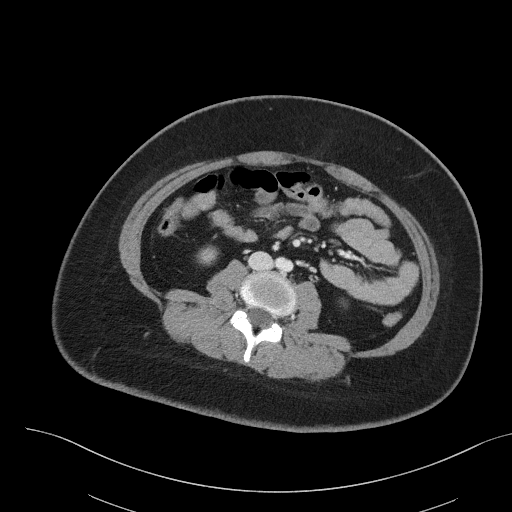
[im 65/97  soft-tissue]
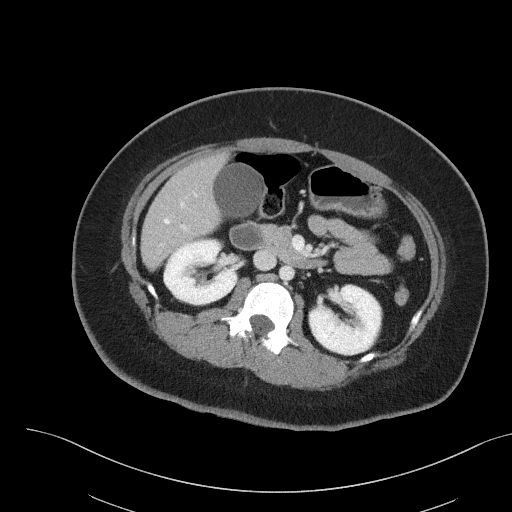
[im 65/97  bone]
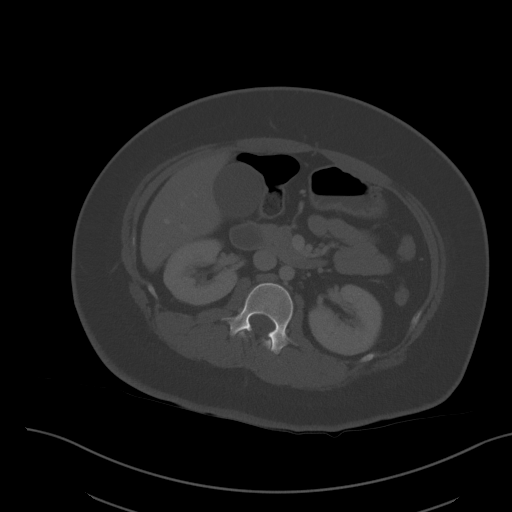
[im 70/97  soft-tissue]
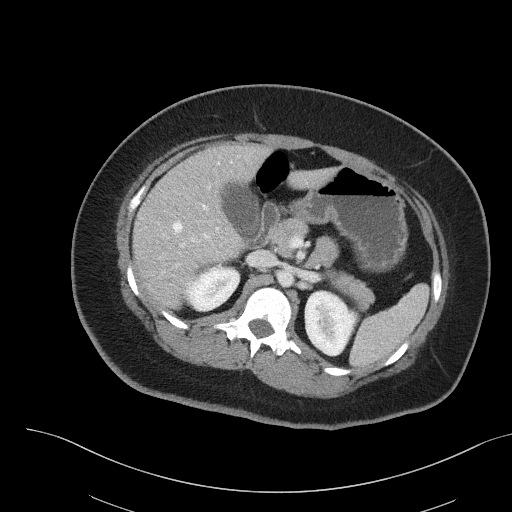
[im 75/97  soft-tissue]
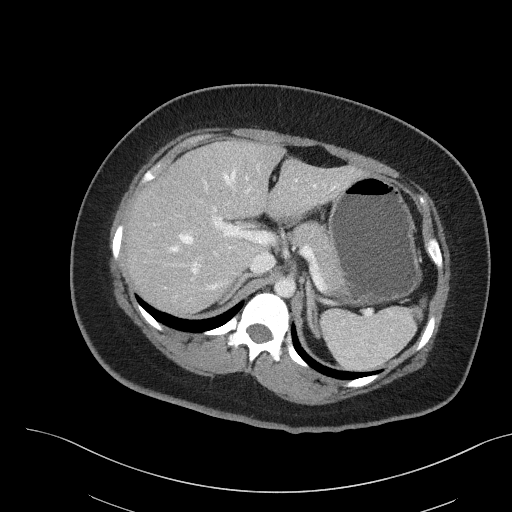
[im 86/97  soft-tissue]
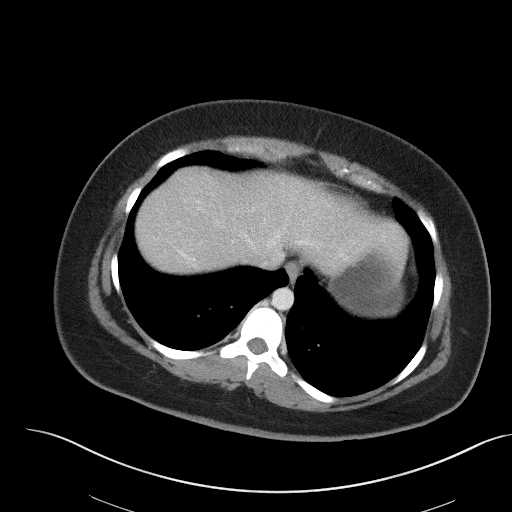
[im 91/97  soft-tissue]
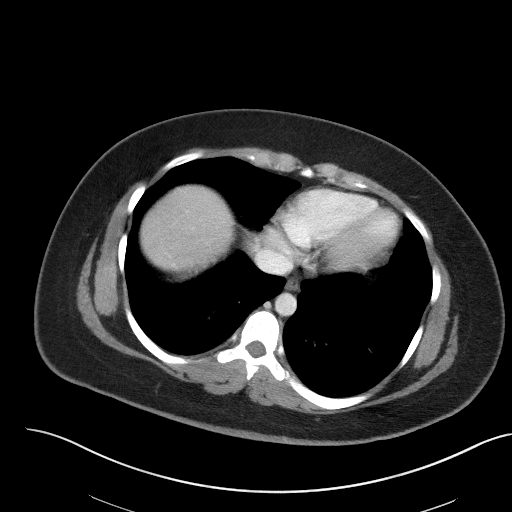

[Series 5: coronal st · coronal · 0.82mm/px · 3 of 108 slices shown]
[im 27/108  soft-tissue]
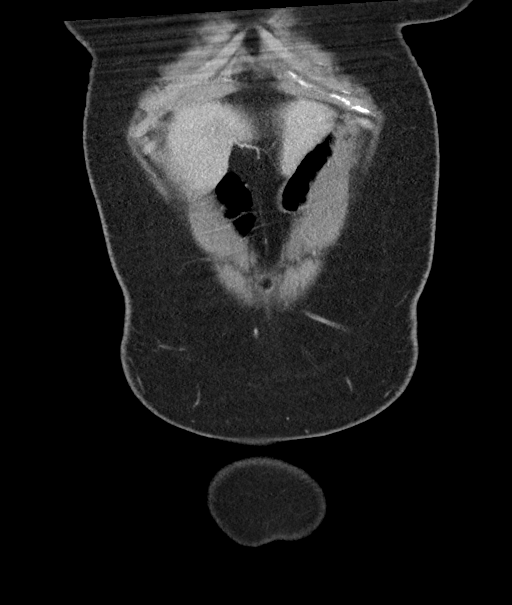
[im 54/108  soft-tissue]
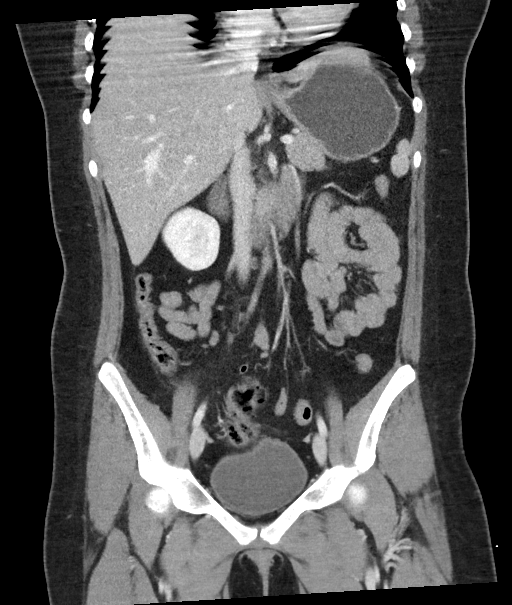
[im 81/108  soft-tissue]
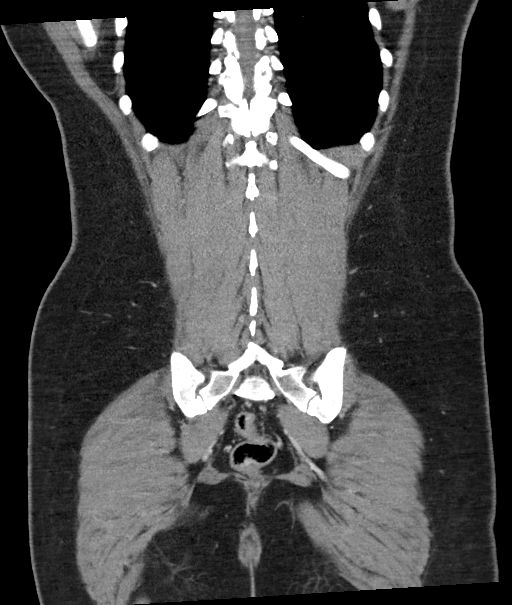

[16 of 46 positions shown; findings below may reference images not displayed]

FINDINGS: Lower chest: No acute abnormality.

Hepatobiliary: Mild focal fatty hepatic infiltration adjacent the
falciform ligament. Liver otherwise unremarkable. No intra or
extrahepatic biliary ductal dilation. Gallbladder unremarkable.

Pancreas: Unremarkable

Spleen: Unremarkable

Adrenals/Urinary Tract: Adrenal glands are unremarkable. Kidneys are
normal, without renal calculi, focal lesion, or hydronephrosis.
Bladder is unremarkable.

Stomach/Bowel: Stomach is within normal limits. Appendix appears
normal. No evidence of bowel wall thickening, distention, or
inflammatory changes. No free intraperitoneal gas or fluid.

Vascular/Lymphatic: No significant vascular findings are present. No
enlarged abdominal or pelvic lymph nodes.

Reproductive: Uterus and bilateral adnexa are unremarkable.

Other: No abdominal wall hernia.  Rectum unremarkable.

Musculoskeletal: No acute bone abnormality. No lytic or blastic bone
lesion.
IMPRESSION: No acute intra-abdominal pathology identified. No definite
radiographic explanation for the patient's reported symptoms.

## 2023-06-03 ENCOUNTER — Ambulatory Visit: Payer: Medicaid Other | Admitting: Family Medicine

## 2023-07-26 ENCOUNTER — Encounter: Payer: Self-pay | Admitting: Family Medicine

## 2023-07-26 ENCOUNTER — Ambulatory Visit (INDEPENDENT_AMBULATORY_CARE_PROVIDER_SITE_OTHER): Payer: Medicaid Other | Admitting: Family Medicine

## 2023-07-26 VITALS — BP 112/62 | HR 72 | Temp 99.9°F | Ht 62.0 in | Wt 155.0 lb

## 2023-07-26 DIAGNOSIS — R350 Frequency of micturition: Secondary | ICD-10-CM | POA: Diagnosis not present

## 2023-07-26 LAB — URINALYSIS, ROUTINE W REFLEX MICROSCOPIC
Bilirubin Urine: NEGATIVE
Glucose, UA: NEGATIVE
Hgb urine dipstick: NEGATIVE
Hyaline Cast: NONE SEEN /[LPF]
Ketones, ur: NEGATIVE
Nitrite: POSITIVE — AB
Protein, ur: NEGATIVE
RBC / HPF: NONE SEEN /[HPF] (ref 0–2)
Specific Gravity, Urine: 1.025 (ref 1.001–1.035)
pH: 6 (ref 5.0–8.0)

## 2023-07-26 LAB — MICROSCOPIC MESSAGE

## 2023-07-26 LAB — WET PREP FOR TRICH, YEAST, CLUE

## 2023-07-26 MED ORDER — SULFAMETHOXAZOLE-TRIMETHOPRIM 800-160 MG PO TABS: 1.0000 | ORAL_TABLET | Freq: Two times a day (BID) | ORAL | 0 refills | Status: AC

## 2023-07-26 NOTE — Addendum Note (Signed)
Addended by: Darral Dash on: 07/26/2023 04:23 PM   Modules accepted: Orders

## 2023-07-26 NOTE — Progress Notes (Signed)
Subjective:    Patient ID: Felicia Martinez, female    DOB: 01-28-2001, 23 y.o.   MRN: [id number]  Vaginal Discharge The patient's primary symptoms include vaginal discharge.  Vaginal Itching The patient's primary symptoms include vaginal discharge.  Patient was treated for bacterial vaginosis in November.  She reports increased vaginal discharge.  She also reports dysuria, urgency, and frequency.  Urinalysis shows +1 leukocyte esterase and positive nitrates.  Wet prep shows no evidence of BV, trichomoniasis, or yeast infection. Past Medical History:  Diagnosis Date   ADHD    Allergy    Asthma    Morbid obesity (HCC)    Schizophrenia in children [facility])    Past Surgical History:  Procedure Laterality Date   CHOLECYSTECTOMY N/A 04/21/2021   Procedure: LAPAROSCOPIC CHOLECYSTECTOMY;  Surgeon: Franky Macho, MD;  Location: [facility];  Service: General;  Laterality: N/A;   Current Outpatient Medications on File Prior to Visit  Medication Sig Dispense Refill   albuterol (VENTOLIN HFA) 108 (90 Base) MCG/ACT inhaler Inhale 2 puffs into the lungs every 6 (six) hours as needed for wheezing or shortness of breath. 8 g 0   buPROPion (WELLBUTRIN XL) 150 MG 24 hr tablet Take 1 tablet (150 mg total) by mouth daily. Take total of 450 mg daily. Take along with 300 mg tab 90 tablet 1   buPROPion (WELLBUTRIN XL) 300 MG 24 hr tablet Take 1 tablet (300 mg total) by mouth daily. Take total of 450 mg daily. Take along with 150 mg tab 90 tablet 1   hydrOXYzine (ATARAX) 25 MG tablet Take 25 mg by mouth daily as needed.     Omega-3 Fatty Acids (FISH OIL PO) Take by mouth.     methylphenidate (RITALIN) 5 MG tablet Take 1 tablet (5 mg total) by mouth 2 (two) times daily. 60 tablet 0   No current facility-administered medications on file prior to visit.   Allergies  Allergen Reactions   Other     Seasonal Allergies-pollen   Venlafaxine Hcl Nausea And Vomiting    Vomiting, hallucinations, fever-like reactions  with concomitant use of bupropion   Social History   Socioeconomic History   Marital status: Single    Spouse name: Not on file   Number of children: Not on file   Years of education: Not on file   Highest education level: Some college, no degree  Occupational History   Not on file  Tobacco Use   Smoking status: Never   Smokeless tobacco: Never  Vaping Use   Vaping status: Former   Substances: CBD  Substance and Sexual Activity   Alcohol use: Yes    Comment: occasional   Drug use: Not Currently    Types: Marijuana   Sexual activity: Yes    Birth control/protection: Condom    Comment: menarch age 4.  Lives with mom.  Parents separated.  Other Topics Concern   Not on file  Social History Narrative   In 9th grade at [employer]. She is performing good in school.  No regular exercise.  More than 3 hours of screen time per day.  Enjoys watching netflix, writing poetry and talking to friends.    Social Drivers of Health   Financial Resource Strain: Low Risk  (07/25/2023)   Overall Financial Resource Strain (CARDIA)    Difficulty of Paying Living Expenses: Not very hard  Food Insecurity: Food Insecurity Present (07/25/2023)   Hunger Vital Sign    Worried About Running Out of Food  in the Last Year: Sometimes true    Ran Out of Food in the Last Year: Never true  Transportation Needs: No Transportation Needs (07/25/2023)   PRAPARE - Administrator, Civil Service (Medical): No    Lack of Transportation (Non-Medical): No  Physical Activity: Sufficiently Active (07/25/2023)   Exercise Vital Sign    Days of Exercise per Week: 5 days    Minutes of Exercise per Session: 40 min  Stress: No Stress Concern Present (07/25/2023)   [employer] of Occupational Health - Occupational Stress Questionnaire    Feeling of Stress : Not at all  Social Connections: Socially Isolated (07/25/2023)   Social Connection and Isolation Panel [NHANES]    Frequency of Communication  with Friends and Family: More than three times a week    Frequency of Social Gatherings with Friends and Family: Never    Attends Religious Services: Never    Database administrator or Organizations: No    Attends Engineer, structural: Not on file    Marital Status: Never married  Intimate Partner Violence: Not At Risk (03/25/2022)   Humiliation, Afraid, Rape, and Kick questionnaire    Fear of Current or Ex-Partner: No    Emotionally Abused: No    Physically Abused: No    Sexually Abused: No      Review of Systems  Genitourinary:  Positive for vaginal discharge.  All other systems reviewed and are negative.      Objective:   Physical Exam Exam conducted with a chaperone present.  Constitutional:      Appearance: Normal appearance. She is obese.  HENT:     Nose: No congestion or rhinorrhea.     Mouth/Throat:     Mouth: Mucous membranes are moist.     Pharynx: Oropharynx is clear. No oropharyngeal exudate or posterior oropharyngeal erythema.  Eyes:     Conjunctiva/sclera: Conjunctivae normal.  Cardiovascular:     Rate and Rhythm: Normal rate and regular rhythm.     Heart sounds: Normal heart sounds. No murmur heard.    No gallop.  Pulmonary:     Effort: Pulmonary effort is normal. No respiratory distress.     Breath sounds: Normal breath sounds.  Genitourinary:    Exam position: Lithotomy position.     Labia:        Right: No rash.        Left: No rash.      Cervix: Discharge present. No cervical motion tenderness, friability or erythema.     Uterus: Normal.      Adnexa: Right adnexa normal and left adnexa normal.  Musculoskeletal:     Cervical back: Neck supple.     Right lower leg: No edema.     Left lower leg: No edema.  Neurological:     Mental Status: She is alert.           Assessment & Plan:  Urinary frequency - Plan: Urinalysis, Routine w reflex microscopic, Urine Culture Patient's wet prep is normal.  Treat the patient's urinary tract  infection with Bactrim double strength tablets twice daily for 7 days.

## 2023-07-28 LAB — URINE CULTURE
MICRO NUMBER:: 16001720
SPECIMEN QUALITY:: ADEQUATE

## 2023-08-06 NOTE — Progress Notes (Signed)
 [facility] MD/PA/NP OP Progress Note  08/09/2023 5:21 PM Felicia Martinez  MRN:  [id number]  Chief Complaint:  Chief Complaint  Patient presents with   Follow-up   HPI:  This is a follow-up appointment for depression, ADHD and insomnia.  She states that she has been doing very well.  She has been taking classes.  She studies in [employer], as she tends to get distracted at home.  She feels proud of herself.  She goes to gym regularly.  Although she notices some difference about her concentration later in the day, it has been manageable as long as she takes Ritalin  twice a day.  She denies feeling depressed or anxiety except the time she was stressed about bills and others. She has been doing well at work.  She has not taken hydroxyzine  since the last visit.. The only concern she has is middle insomnia.  Although she cuts screen time at least 2 hours before going to the bed, she still wakes up.  She denies any caffeine intake.  Although she is concerned about starting medication for sleep, she is interested in this as well.  Provided psychoeducation about doxepin , possible risk including drowsiness.    Wt Readings from Last 3 Encounters:  08/09/23 154 lb 9.6 oz (70.1 kg)  07/26/23 155 lb (70.3 kg)  05/20/23 157 lb 8 oz (71.4 kg)    Visit Diagnosis:    ICD-10-CM   1. MDD (major depressive disorder), recurrent, in partial remission (HCC)  F33.41     2. GAD (generalized anxiety disorder)  F41.1     3. Attention deficit hyperactivity disorder (ADHD), unspecified ADHD type  F90.9     4. Insomnia, unspecified type  G47.00       Past Psychiatric History: Please see initial evaluation for full details. I have reviewed the history. No updates at this time.     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 I have reviewed the history. No updates at this time.     Family History:  Family History  Problem Relation Age of Onset   Hypertension Mother    Anxiety disorder Mother    Depression Mother    Lung cancer Maternal Grandmother    Schizophrenia Maternal Aunt     Social History:  Social History   Socioeconomic History   Marital status: Single    Spouse name: Not on file   Number of children: Not on file   Years of education: Not on file   Highest education level: Some college, no degree  Occupational History   Not on file  Tobacco Use   Smoking status: Never   Smokeless tobacco: Never  Vaping Use   Vaping status: Former   Substances: CBD  Substance and Sexual Activity   Alcohol use: Yes    Comment: occasional   Drug use: Not Currently    Types: Marijuana   Sexual activity: Yes    Birth control/protection: Condom    Comment: menarch age 44.  Lives with mom.  Parents separated.  Other Topics Concern  Not on file  Social History Narrative   In 9th grade at [employer]. She is performing good in school.  No regular exercise.  More than 3 hours of screen time per day.  Enjoys watching netflix, writing poetry and talking to friends.    Social Drivers of Corporate investment banker Strain: Low Risk  (07/25/2023)   Overall Financial Resource Strain (CARDIA)    Difficulty of Paying Living Expenses: Not very hard  Food Insecurity: Food Insecurity Present (07/25/2023)   Hunger Vital Sign    Worried About Running Out of Food in the Last Year: Sometimes true    Ran Out of Food in the Last Year: Never true  Transportation Needs: No Transportation Needs (07/25/2023)   PRAPARE - Administrator, Civil Service (Medical): No    Lack of  Transportation (Non-Medical): No  Physical Activity: Sufficiently Active (07/25/2023)   Exercise Vital Sign    Days of Exercise per Week: 5 days    Minutes of Exercise per Session: 40 min  Stress: No Stress Concern Present (07/25/2023)   [employer] of Occupational Health - Occupational Stress Questionnaire    Feeling of Stress : Not at all  Social Connections: Socially Isolated (07/25/2023)   Social Connection and Isolation Panel [NHANES]    Frequency of Communication with Friends and Family: More than three times a week    Frequency of Social Gatherings with Friends and Family: Never    Attends Religious Services: Never    Database administrator or Organizations: No    Attends Engineer, structural: Not on file    Marital Status: Never married    Allergies:  Allergies  Allergen Reactions   Other     Seasonal Allergies-pollen   Venlafaxine  Hcl Nausea And Vomiting    Vomiting, hallucinations, fever-like reactions with concomitant use of bupropion     Metabolic Disorder Labs: Lab Results  Component Value Date   HGBA1C 5.0 02/16/2022   MPG 97 02/16/2022   MPG 108 08/06/2020   Lab Results  Component Value Date   PROLACTIN 8.8 01/17/2015   Lab Results  Component Value Date   CHOL 230 (H) 02/16/2022   TRIG 210 (H) 02/16/2022   HDL 48 (L) 02/16/2022   CHOLHDL 4.8 02/16/2022   VLDL 35 (H) 08/02/2015   LDLCALC 147 (H) 02/16/2022   LDLCALC 100 10/18/2019   Lab Results  Component Value Date   TSH 0.738 12/25/2021   TSH 2.280 03/17/2021    Therapeutic Level Labs: No results found for: "LITHIUM" No results found for: "VALPROATE" No results found for: "CBMZ"  Current Medications: Current Outpatient Medications  Medication Sig Dispense Refill   albuterol  (VENTOLIN  HFA) 108 (90 Base) MCG/ACT inhaler Inhale 2 puffs into the lungs every 6 (six) hours as needed for wheezing or shortness of breath. 8 g 0   buPROPion  (WELLBUTRIN  XL) 150 MG 24 hr tablet Take 1  tablet (150 mg total) by mouth daily. Take total of 450 mg daily. Take along with 300 mg tab 90 tablet 1   buPROPion  (WELLBUTRIN  XL) 300 MG 24 hr tablet Take 1 tablet (300 mg total) by mouth daily. Take total of 450 mg daily. Take along with 150 mg tab 90 tablet 1   hydrOXYzine  (ATARAX ) 25 MG tablet Take 25 mg by mouth daily as needed.     Omega-3 Fatty Acids (FISH OIL PO) Take by mouth.     sulfamethoxazole -trimethoprim  (BACTRIM  DS) 800-160 MG tablet Take 1  tablet by mouth 2 (two) times daily. 14 tablet 0   methylphenidate  (RITALIN ) 5 MG tablet Take 1 tablet (5 mg total) by mouth 2 (two) times daily. 60 tablet 0   No current facility-administered medications for this visit.     Musculoskeletal: Strength & Muscle Tone: within normal limits Gait & Station: normal Patient leans: N/A  Psychiatric Specialty Exam: Review of Systems  Psychiatric/Behavioral:  Positive for sleep disturbance. Negative for agitation, behavioral problems, confusion, decreased concentration, dysphoric mood, hallucinations, self-injury and suicidal ideas. The patient is not nervous/anxious and is not hyperactive.   All other systems reviewed and are negative.   Blood pressure 118/76, pulse 92, temperature 98.3 F (36.8 C), temperature source Temporal, height 5\' 2"  (1.575 m), weight 154 lb 9.6 oz (70.1 kg), last menstrual period 07/30/2022, SpO2 99%.Body mass index is 28.28 kg/m.  General Appearance: Well Groomed  Eye Contact:  Good  Speech:  Clear and Coherent  Volume:  Normal  Mood:   good  Affect:  Appropriate, Congruent, and Full Range  Thought Process:  Coherent  Orientation:  Full (Time, Place, and Person)  Thought Content: Logical   Suicidal Thoughts:  No  Homicidal Thoughts:  No  Memory:  Immediate;   Good  Judgement:  Good  Insight:  Good  Psychomotor Activity:  Normal  Concentration:  Concentration: Good and Attention Span: Good  Recall:  Good  Fund of Knowledge: Good  Language: Good   Akathisia:  No  Handed:  Right  AIMS (if indicated): not done  Assets:  Communication Skills Desire for Improvement  ADL's:  Intact  Cognition: WNL  Sleep:  Poor   Screenings: GAD-7    Flowsheet Row Office Visit from 08/27/2022 in [facility] Office Visit from 06/16/2022 in [facility] Office Visit from 04/21/2022 in [facility] Office Visit from 03/25/2022 in [facility] Office Visit from 01/26/2022 in [facility]  Total GAD-7 Score 5 2 2 9 17       PHQ2-9    Flowsheet Row Office Visit from 05/20/2023 in [facility] Office Visit from 08/27/2022 in [facility] Office Visit from 06/16/2022 in [facility] Office Visit from 04/21/2022 in [facility] Office Visit from 04/03/2022 in [facility]  PHQ-2 Total Score 0 0 1 1 1   PHQ-9 Total Score -- 3 5 6  --      Flowsheet Row Office Visit from 06/16/2022 in [facility] Office Visit from 04/21/2022 in [facility] ED from 03/04/2022 in [facility] Emergency Department at [facility]  C-SSRS RISK CATEGORY Error: Question 2 not populated Error: Q3, 4, or 5 should not be populated when Q2 is No No Risk        Assessment and Plan:  PETRIA SEAMON is a 23 y.o. year old female with a history of depression, who presents for follow up appointment for below.    1. MDD (major depressive disorder), recurrent, in partial remission (HCC) 2. GAD (generalized anxiety disorder) Acute stressors include: broke up with her fiance Other stressors include:    History:   Her mood is  consistently improving since discontinuation of marijuana. She attends school and works at the [employer] without any issues..   Will  continue current dose of bupropion  to target depression.   3. Attention deficit hyperactivity disorder (ADHD), unspecified ADHD type - could not afford neuropsych testing. UDS positive for cannabinoid. 03/2022 She reports significant improvement in concentration, procrastination since discontinuation of marijuana. There is no sign of misuse of this medication.  Will continue current dose of Ritalin  to target ADHD.    4. Insomnia, unspecified type - sleep test was negative for sleep apnea per her report She continues to struggle with middle insomnia.  She agrees to continue to work on sleep hygiene. Given her history of marijuana use, Z-drugs, and DORAs, these options may not be ideal due to potential concerns regarding dependence. Instead, we will try a low dose of doxepin  as needed for insomnia.     Plan Continue bupropion  450 mg daily  Continue Ritalin  5 mg twice a day  Start doxepin  6 mg at night as needed for insomnia Next appointment: 4/8 at 3:30, IP - discussed attending policy   Past trials of medication: sertraline, lexapro  (anxiety), venlafaxine  (vomiting/fever),  Concerta , Abilify (weight gain) Trazodone, melatonin (felt sad)   I have utilized the [place] Controlled Substances Reporting System (PMP AWARxE) to confirm adherence regarding the patient's medication. My review reveals appropriate prescription fills.    The patient demonstrates the following risk factors for suicide: Chronic risk factors for suicide include: psychiatric disorder of depression. Acute risk factors for suicide include: N/A. Protective factors for this patient include: positive social support, coping skills and hope for the future. Considering these factors, the overall suicide risk at this point appears to be low. Patient is appropriate for outpatient follow up       Collaboration of Care: Collaboration of Care: Other reviewed notes in Epic  Patient/Guardian was advised Release of Information must be obtained prior to any record release in order to collaborate their care with an outside provider. Patient/Guardian was advised if they have not already done so to contact the registration department to sign all necessary forms in order for us  to release information regarding their care.   Consent: Patient/Guardian gives verbal consent for treatment and assignment of benefits for services provided during this visit. Patient/Guardian expressed understanding and agreed to proceed.    Todd Fossa, MD 08/09/2023, 5:21 PM

## 2023-08-09 ENCOUNTER — Encounter: Payer: Self-pay | Admitting: Psychiatry

## 2023-08-09 ENCOUNTER — Ambulatory Visit (INDEPENDENT_AMBULATORY_CARE_PROVIDER_SITE_OTHER): Payer: Medicaid Other | Admitting: Psychiatry

## 2023-08-09 VITALS — BP 118/76 | HR 92 | Temp 98.3°F | Ht 62.0 in | Wt 154.6 lb

## 2023-08-09 DIAGNOSIS — F909 Attention-deficit hyperactivity disorder, unspecified type: Secondary | ICD-10-CM | POA: Diagnosis not present

## 2023-08-09 DIAGNOSIS — F3341 Major depressive disorder, recurrent, in partial remission: Secondary | ICD-10-CM | POA: Diagnosis not present

## 2023-08-09 DIAGNOSIS — F411 Generalized anxiety disorder: Secondary | ICD-10-CM | POA: Diagnosis not present

## 2023-08-09 DIAGNOSIS — G4700 Insomnia, unspecified: Secondary | ICD-10-CM

## 2023-08-09 MED ORDER — DOXEPIN HCL 6 MG PO TABS: 6.0000 mg | ORAL_TABLET | Freq: Every evening | ORAL | 1 refills | Status: DC | PRN

## 2023-08-09 MED ORDER — METHYLPHENIDATE HCL 5 MG PO TABS: 5.0000 mg | ORAL_TABLET | Freq: Two times a day (BID) | ORAL | 0 refills | Status: DC

## 2023-08-09 MED ORDER — METHYLPHENIDATE HCL 5 MG PO TABS
5.0000 mg | ORAL_TABLET | Freq: Two times a day (BID) | ORAL | 0 refills | Status: DC
Start: 2023-09-08 — End: 2023-11-15

## 2023-08-09 NOTE — Patient Instructions (Signed)
 Continue bupropion  450 mg daily  Continue Ritalin  5 mg twice a day  Start doxepin  6 mg at night as needed for insomnia Next appointment: 4/8 at 3:30

## 2023-08-10 ENCOUNTER — Telehealth: Payer: Self-pay

## 2023-08-10 NOTE — Telephone Encounter (Signed)
Received fax for Prior Auth for patients Doxepin HCI 6 mg Tablets initiated via CoverMyMeds   Approved  PA case # [id number] Coverage 08-10-23/08-09-24  Called patient to make aware

## 2023-08-10 NOTE — Telephone Encounter (Signed)
went online and submitted the prior auth - pending

## 2023-08-10 NOTE — Telephone Encounter (Signed)
prior Berkley Harvey was approved from 2-11 to 08-09-24 PA CASE # [id number]

## 2023-08-10 NOTE — Telephone Encounter (Signed)
received email that a prior auth was needed for the doxepin.

## 2023-10-02 NOTE — Progress Notes (Unsigned)
 [facility] MD/PA/NP OP Progress Note  10/05/2023 4:21 PM Felicia Martinez  MRN:  [id number]  Chief Complaint:  Chief Complaint  Patient presents with   Follow-up   HPI:  This is a follow-up appointment for depression and ADHD.  She states that she has been doing the same.  She had an episode of feeling depressed for 2 weeks.  This occurred after she drank 2 bottles of wine with her friends on birthday.  She did not go to the gym, which made her feel down.  She also struggles with insomnia and appetite loss.  She now recognizes that she cannot go that far, and tries to be mindful about her drinking.  She also has been feeling a little sad in the past few days, referring to her ex fianc, who left around this time.  Although she denies any significant emotional pain, she wonders whether she would have this again. She tends to avoid men when they approach to her as she cannot trust them.  Explored her values and validated her guardedness as an understandable response given her past experiences.  She may consider seeing a therapist in the future.  She now wants to focus on the semester in fall.  She will have 5 classes.  She feels a little concerned whether it is manageable.  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  She denies concern about her focus.  She has been able to keep up with classes.  She discontinued doxepin as she was feeling drowsy from the medication. She occasionally takes malatonin with some effect.  She denies SI.  She agrees with the plan as outlined below.  Substance use  Tobacco Alcohol Other substances/  Current  Two bottles of wine on her birthday Denies since Oct 2024  Past denies occasional Weed/marijuana every day  Past Treatment         Wt Readings from Last 3 Encounters:  10/05/23 158 lb 3.2 oz (71.8 kg)  08/09/23 154 lb 9.6 oz (70.1 kg)  07/26/23 155 lb (70.3 kg)     Visit Diagnosis:     ICD-10-CM   1. MDD (major depressive disorder), recurrent episode, mild (HCC)  F33.0     2. GAD (generalized anxiety disorder)  F41.1     3. High risk medication use  Z79.899 Monitor Drug Profile 10(MW)    4. Attention deficit hyperactivity disorder (ADHD), unspecified ADHD type  F90.9       Past Psychiatric History: Please see initial evaluation for full details. I have reviewed the history. No updates at this time.     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 I have reviewed the history. No updates at this time.     Family History:  Family History  Problem Relation Age of Onset   Hypertension Mother    Anxiety disorder Mother    Depression Mother    Lung cancer Maternal Grandmother    Schizophrenia Maternal Aunt  Social History:  Social History   Socioeconomic History   Marital status: Single    Spouse name: Not on file   Number of children: Not on file   Years of education: Not on file   Highest education level: Some college, no degree  Occupational History   Not on file  Tobacco Use   Smoking status: Never   Smokeless tobacco: Never  Vaping Use   Vaping status: Former   Substances: CBD  Substance and Sexual Activity   Alcohol use: Yes    Comment: occasional   Drug use: Not Currently    Types: Marijuana   Sexual activity: Yes    Birth control/protection: Condom    Comment: menarch age 77.  Lives with mom.  Parents separated.  Other Topics Concern   Not on file  Social History Narrative   In 9th grade at [employer]. She is performing  good in school.  No regular exercise.  More than 3 hours of screen time per day.  Enjoys watching netflix, writing poetry and talking to friends.    Social Drivers of Corporate investment banker Strain: Low Risk  (07/25/2023)   Overall Financial Resource Strain (CARDIA)    Difficulty of Paying Living Expenses: Not very hard  Food Insecurity: Food Insecurity Present (07/25/2023)   Hunger Vital Sign    Worried About Running Out of Food in the Last Year: Sometimes true    Ran Out of Food in the Last Year: Never true  Transportation Needs: No Transportation Needs (07/25/2023)   PRAPARE - Administrator, Civil Service (Medical): No    Lack of Transportation (Non-Medical): No  Physical Activity: Sufficiently Active (07/25/2023)   Exercise Vital Sign    Days of Exercise per Week: 5 days    Minutes of Exercise per Session: 40 min  Stress: No Stress Concern Present (07/25/2023)   [employer] of Occupational Health - Occupational Stress Questionnaire    Feeling of Stress : Not at all  Social Connections: Socially Isolated (07/25/2023)   Social Connection and Isolation Panel [NHANES]    Frequency of Communication with Friends and Family: More than three times a week    Frequency of Social Gatherings with Friends and Family: Never    Attends Religious Services: Never    Database administrator or Organizations: No    Attends Engineer, structural: Not on file    Marital Status: Never married    Allergies:  Allergies  Allergen Reactions   Other     Seasonal Allergies-pollen   Venlafaxine Hcl Nausea And Vomiting    Vomiting, hallucinations, fever-like reactions with concomitant use of bupropion    Metabolic Disorder Labs: Lab Results  Component Value Date   HGBA1C 5.0 02/16/2022   MPG 97 02/16/2022   MPG 108 08/06/2020   Lab Results  Component Value Date   PROLACTIN 8.8 01/17/2015   Lab Results  Component Value Date   CHOL 230 (H) 02/16/2022   TRIG 210 (H)  02/16/2022   HDL 48 (L) 02/16/2022   CHOLHDL 4.8 02/16/2022   VLDL 35 (H) 08/02/2015   LDLCALC 147 (H) 02/16/2022   LDLCALC 100 10/18/2019   Lab Results  Component Value Date   TSH 0.738 12/25/2021   TSH 2.280 03/17/2021    Therapeutic Level Labs: No results found for: "LITHIUM" No results found for: "VALPROATE" No results found for: "CBMZ"  Current Medications: Current Outpatient Medications  Medication Sig Dispense Refill  albuterol (VENTOLIN HFA) 108 (90 Base) MCG/ACT inhaler Inhale 2 puffs into the lungs every 6 (six) hours as needed for wheezing or shortness of breath. 8 g 0   buPROPion (WELLBUTRIN XL) 150 MG 24 hr tablet Take 1 tablet (150 mg total) by mouth daily. Take total of 450 mg daily. Take along with 300 mg tab 90 tablet 1   buPROPion (WELLBUTRIN XL) 300 MG 24 hr tablet Take 1 tablet (300 mg total) by mouth daily. Take total of 450 mg daily. Take along with 150 mg tab 90 tablet 1   Doxepin HCl 6 MG TABS Take 1 tablet (6 mg total) by mouth at bedtime as needed. 30 tablet 1   hydrOXYzine (ATARAX) 25 MG tablet Take 25 mg by mouth daily as needed.     methylphenidate (RITALIN) 5 MG tablet Take 1 tablet (5 mg total) by mouth 2 (two) times daily. 60 tablet 0   Omega-3 Fatty Acids (FISH OIL PO) Take by mouth.     sulfamethoxazole-trimethoprim (BACTRIM DS) 800-160 MG tablet Take 1 tablet by mouth 2 (two) times daily. 14 tablet 0   methylphenidate (RITALIN) 5 MG tablet Take 1 tablet (5 mg total) by mouth 2 (two) times daily. 60 tablet 0   methylphenidate (RITALIN) 5 MG tablet Take 1 tablet (5 mg total) by mouth 2 (two) times daily. 60 tablet 0   No current facility-administered medications for this visit.     Musculoskeletal: Strength & Muscle Tone: within normal limits Gait & Station: normal Patient leans: N/A  Psychiatric Specialty Exam: Review of Systems  Blood pressure 108/76, pulse 82, temperature (!) 97.3 F (36.3 C), temperature source Temporal, height 5\' 2"   (1.575 m), weight 158 lb 3.2 oz (71.8 kg).Body mass index is 28.94 kg/m.  General Appearance: Well Groomed  Eye Contact:  Good  Speech:  Clear and Coherent  Volume:  Normal  Mood:   sad  Affect:  Appropriate, Congruent, and slightly down, but reactive  Thought Process:  Coherent  Orientation:  Full (Time, Place, and Person)  Thought Content: Logical   Suicidal Thoughts:  No  Homicidal Thoughts:  No  Memory:  Immediate;   Good  Judgement:  Good  Insight:  Good  Psychomotor Activity:  Normal  Concentration:  Concentration: Good and Attention Span: Good  Recall:  Good  Fund of Knowledge: Good  Language: Good  Akathisia:  No  Handed:  Right  AIMS (if indicated): not done  Assets:  Communication Skills Desire for Improvement  ADL's:  Intact  Cognition: WNL  Sleep:  Fair   Screenings: GAD-7    Garment/textile technologist Visit from 08/27/2022 in [facility] Office Visit from 06/16/2022 in [facility] Office Visit from 04/21/2022 in [facility] Office Visit from 03/25/2022 in [facility] Office Visit from 01/26/2022 in [facility]  Total GAD-7 Score 5 2 2 9 17       PHQ2-9    Flowsheet Row Office Visit from 05/20/2023 in [facility] Office Visit from 08/27/2022 in [facility] Office Visit from 06/16/2022 in [facility] Office Visit from 04/21/2022 in [facility] Office Visit from 04/03/2022 in [facility]  PHQ-2 Total Score 0 0 1 1 1   PHQ-9 Total Score -- 3 5 6  --  Flowsheet Row Office Visit from 06/16/2022 in [facility] Office Visit from 04/21/2022 in [facility] ED from 03/04/2022 in [facility] Department at [facility]  C-SSRS RISK CATEGORY Error: Question 2 not populated Error: Q3, 4, or 5 should not be populated when Q2 is No No Risk        Assessment and Plan:  Felicia Martinez is a 23 y.o. year old female with a history of depression, who presents for follow up appointment for below.   1. MDD (major depressive disorder), recurrent episode, mild (HCC) 2. GAD (generalized anxiety disorder) Acute stressors include: broke up with her fiance in 2024 Other stressors include:    History:   Exam is notable for slightly down affect, and she experienced depressive symptoms in the setting of recent binge alcohol use on her birthday, and anniversary of her ex-fiance left around this time. She has returned to her daily routine and denies any current concerns about her mood. 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 will continue current dose of bupropion to target depression.   3. High risk medication use Will obtain UDS.   4. Attention deficit hyperactivity disorder (ADHD), unspecified ADHD type  - could not afford neuropsych testing. UDS positive for cannabinoid. 03/2022  She reports good attention, and improvement in concentration and procrastination since discontinuation of marijuana.  Will continue current dose of Ritalin to target ADHD.    4. Insomnia, unspecified type - sleep test was negative for sleep apnea per her report Overall improving.  She had adverse reaction of drowsiness from doxepin.  Will hold this medication.  Given her history of marijuana use, Z-drugs, and DORAs, these options may not be ideal due to potential concerns regarding dependence.  She feels comfortable without hypnotics for now.    Plan Continue bupropion 450 mg daily  Continue Ritalin 5 mg twice a day  Hold doxepin Next appointment- 5/19 at  3:30   Past trials of medication: sertraline, lexapro (anxiety), venlafaxine (vomiting/fever),  Concerta, Abilify (weight gain) Trazodone, melatonin (felt sad)   The patient demonstrates the following risk factors for suicide: Chronic risk factors for suicide include: psychiatric disorder of depression. Acute risk factors for suicide include: N/A. Protective factors for this patient include: positive social support, coping skills and hope for the future. Considering these factors, the overall suicide risk at this point appears to be low. Patient is appropriate for outpatient follow up    Collaboration of Care: Collaboration of Care: Other reviewed notes in Epic  Patient/Guardian was advised Release of Information must be obtained prior to any record release in order to collaborate their care with an outside provider. Patient/Guardian was advised if they have not already done so to contact the registration department to sign all necessary forms in order for [place] to release information regarding their care.   Consent: Patient/Guardian gives verbal consent for treatment and assignment of benefits for services provided during this visit. Patient/Guardian expressed understanding and agreed to proceed.    Neysa Hotter, MD 10/05/2023, 4:21 PM

## 2023-10-05 ENCOUNTER — Other Ambulatory Visit: Payer: Self-pay

## 2023-10-05 ENCOUNTER — Ambulatory Visit (INDEPENDENT_AMBULATORY_CARE_PROVIDER_SITE_OTHER): Payer: Medicaid Other | Admitting: Psychiatry

## 2023-10-05 ENCOUNTER — Encounter: Payer: Self-pay | Admitting: Psychiatry

## 2023-10-05 VITALS — BP 108/76 | HR 82 | Temp 97.3°F | Ht 62.0 in | Wt 158.2 lb

## 2023-10-05 DIAGNOSIS — F909 Attention-deficit hyperactivity disorder, unspecified type: Secondary | ICD-10-CM | POA: Diagnosis not present

## 2023-10-05 DIAGNOSIS — F411 Generalized anxiety disorder: Secondary | ICD-10-CM

## 2023-10-05 DIAGNOSIS — F330 Major depressive disorder, recurrent, mild: Secondary | ICD-10-CM

## 2023-10-05 DIAGNOSIS — Z79899 Other long term (current) drug therapy: Secondary | ICD-10-CM | POA: Diagnosis not present

## 2023-10-05 MED ORDER — METHYLPHENIDATE HCL 5 MG PO TABS: 5.0000 mg | ORAL_TABLET | Freq: Two times a day (BID) | ORAL | 0 refills | Status: AC

## 2023-10-05 NOTE — Patient Instructions (Signed)
 Continue bupropion 450 mg daily  Continue Ritalin 5 mg twice a day  Next appointment- 5/19 at 3:30

## 2023-11-03 ENCOUNTER — Telehealth: Payer: Self-pay

## 2023-11-03 NOTE — Telephone Encounter (Signed)
 pt left a message that [facility] does not have any orders for her. that she could not be found in their system.

## 2023-11-03 NOTE — Telephone Encounter (Signed)
 Please advise her to go to [facility] for her labs, not [facility].

## 2023-11-05 NOTE — Telephone Encounter (Signed)
 pt was notified of the information. i apologized to patient that i was out yesterday and no one called her back. she stated that she understood and it was ok.

## 2023-11-09 NOTE — Progress Notes (Signed)
 [facility] MD/PA/NP OP Progress Note  11/15/2023 4:07 PM Felicia Martinez  MRN:  [id number]  Chief Complaint:  Chief Complaint  Patient presents with   Follow-up   HPI:  This is a follow-up appointment for depression and ADHD.  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  There is a lot of information.  However, she enjoys learning. She reports good working environment that she has some concern about a Radio broadcast assistant, who is reaching out to her through Team.  She is planning to take class during the summer, and next semesters.  She goes to gym in the morning.  She loves going there, stating that she would be mean if she does not go there.  She feels more in control of herself.  She reports slight decrease in appetite, which she attributes to stress, although she denies concern at this time.  She sleeps well after coming back from work.  She denies SI.  Although she notices difficulty in concentration if she were not to take the second dose of Ritalin , she functions well when she takes the medication regularly.  She agrees to stay on the current medication regimen at this time. Of note, she asks about the medication regimen if she were to be pregnant. It is discussed that she will likely be continued on bupropion , although ritalin  will be discontinued, weighing risk and benefits.  She expressed understanding and agreed to revisit the discussion when she begins planning for pregnancy, though she currently has no such plans.   Wt Readings from Last 3 Encounters:  11/15/23 158 lb 12.8 oz (72 kg)  10/05/23 158 lb 3.2 oz (71.8 kg)  08/09/23 154 lb 9.6 oz (70.1 kg)     Substance use   Tobacco Alcohol Other substances/  Current   Only occasionally Drank Two bottles of wine on her birthday Denies since Oct 2024  Past denies occasional Weed/marijuana every day  Past Treatment           Visit Diagnosis:    ICD-10-CM   1. MDD (major depressive disorder), recurrent, in partial remission (HCC)  F33.41      2. GAD (generalized anxiety disorder)  F41.1     3. Attention deficit hyperactivity disorder (ADHD), unspecified ADHD type  F90.9       Past Psychiatric History: Please see initial evaluation for full details. I have reviewed the history. No updates at this time.     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 I have reviewed the history. No updates at this time.     Family History:  Family History  Problem Relation Age of Onset   Hypertension Mother    Anxiety disorder Mother    Depression Mother    Lung cancer Maternal Grandmother    Schizophrenia Maternal Aunt     Social History:  Social History   Socioeconomic History   Marital status: Single    Spouse name: Not on file   Number of children: Not on file   Years of education: Not on file  Highest education level: Some college, no degree  Occupational History   Not on file  Tobacco Use   Smoking status: Never   Smokeless tobacco: Never  Vaping Use   Vaping status: Former   Substances: CBD  Substance and Sexual Activity   Alcohol use: Yes    Comment: occasional   Drug use: Not Currently    Types: Marijuana   Sexual activity: Yes    Birth control/protection: Condom    Comment: menarch age 85.  Lives with mom.  Parents separated.  Other Topics Concern   Not on file  Social History Narrative   In 9th grade at [employer]. She is performing good in school.  No regular exercise.  More than 3 hours of screen time per day.  Enjoys watching netflix, writing poetry and talking to friends.    Social  Drivers of Corporate investment banker Strain: Low Risk  (07/25/2023)   Overall Financial Resource Strain (CARDIA)    Difficulty of Paying Living Expenses: Not very hard  Food Insecurity: Food Insecurity Present (07/25/2023)   Hunger Vital Sign    Worried About Running Out of Food in the Last Year: Sometimes true    Ran Out of Food in the Last Year: Never true  Transportation Needs: No Transportation Needs (07/25/2023)   PRAPARE - Administrator, Civil Service (Medical): No    Lack of Transportation (Non-Medical): No  Physical Activity: Sufficiently Active (07/25/2023)   Exercise Vital Sign    Days of Exercise per Week: 5 days    Minutes of Exercise per Session: 40 min  Stress: No Stress Concern Present (07/25/2023)   [employer] of Occupational Health - Occupational Stress Questionnaire    Feeling of Stress : Not at all  Social Connections: Socially Isolated (07/25/2023)   Social Connection and Isolation Panel [NHANES]    Frequency of Communication with Friends and Family: More than three times a week    Frequency of Social Gatherings with Friends and Family: Never    Attends Religious Services: Never    Database administrator or Organizations: No    Attends Engineer, structural: Not on file    Marital Status: Never married    Allergies:  Allergies  Allergen Reactions   Other     Seasonal Allergies-pollen   Venlafaxine  Hcl Nausea And Vomiting    Vomiting, hallucinations, fever-like reactions with concomitant use of bupropion     Metabolic Disorder Labs: Lab Results  Component Value Date   HGBA1C 5.0 02/16/2022   MPG 97 02/16/2022   MPG 108 08/06/2020   Lab Results  Component Value Date   PROLACTIN 8.8 01/17/2015   Lab Results  Component Value Date   CHOL 230 (H) 02/16/2022   TRIG 210 (H) 02/16/2022   HDL 48 (L) 02/16/2022   CHOLHDL 4.8 02/16/2022   VLDL 35 (H) 08/02/2015   LDLCALC 147 (H) 02/16/2022   LDLCALC 100 10/18/2019   Lab Results   Component Value Date   TSH 0.738 12/25/2021   TSH 2.280 03/17/2021    Therapeutic Level Labs: No results found for: "LITHIUM" No results found for: "VALPROATE" No results found for: "CBMZ"  Current Medications: Current Outpatient Medications  Medication Sig Dispense Refill   albuterol  (VENTOLIN  HFA) 108 (90 Base) MCG/ACT inhaler Inhale 2 puffs into the lungs every 6 (six) hours as needed for wheezing or shortness of breath. 8 g 0   buPROPion  (WELLBUTRIN  XL) 150 MG 24 hr tablet Take  1 tablet (150 mg total) by mouth daily. Take total of 450 mg daily. Take along with 300 mg tab 90 tablet 1   buPROPion  (WELLBUTRIN  XL) 300 MG 24 hr tablet Take 1 tablet (300 mg total) by mouth daily. Take total of 450 mg daily. Take along with 150 mg tab 90 tablet 1   methylphenidate  (RITALIN ) 5 MG tablet Take 1 tablet (5 mg total) by mouth 2 (two) times daily. 60 tablet 0   Omega-3 Fatty Acids (FISH OIL PO) Take by mouth.     sulfamethoxazole -trimethoprim  (BACTRIM  DS) 800-160 MG tablet Take 1 tablet by mouth 2 (two) times daily. 14 tablet 0   methylphenidate  (RITALIN ) 5 MG tablet Take 1 tablet (5 mg total) by mouth 2 (two) times daily. 60 tablet 0   [START ON 12/15/2023] methylphenidate  (RITALIN ) 5 MG tablet Take 1 tablet (5 mg total) by mouth 2 (two) times daily. 60 tablet 0   No current facility-administered medications for this visit.     Musculoskeletal: Strength & Muscle Tone: normal Gait & Station: normal Patient leans: N/A  Psychiatric Specialty Exam: Review of Systems  Blood pressure 111/80, pulse 85, temperature (!) 97.5 F (36.4 C), temperature source Temporal, height 5\' 2"  (1.575 m), weight 158 lb 12.8 oz (72 kg).Body mass index is 29.04 kg/m.  General Appearance: Well Groomed  Eye Contact:  Good  Speech:  Clear and Coherent  Volume:  Normal  Mood:  good  Affect:  Appropriate, Congruent, and Full Range  Thought Process:  Coherent  Orientation:  Full (Time, Place, and Person)   Thought Content: Logical   Suicidal Thoughts:  No  Homicidal Thoughts:  No  Memory:  Immediate;   Good  Judgement:  Good  Insight:  Good  Psychomotor Activity:  Normal  Concentration:  Concentration: Good and Attention Span: Good  Recall:  Good  Fund of Knowledge: Good  Language: Good  Akathisia:  No  Handed:  Right  AIMS (if indicated): not done  Assets:  Communication Skills Desire for Improvement  ADL's:  Intact  Cognition: WNL  Sleep:  Good   Screenings: GAD-7    Flowsheet Row Office Visit from 08/27/2022 in [facility] Office Visit from 06/16/2022 in [facility] Office Visit from 04/21/2022 in [facility] Office Visit from 03[facility]25/2022 in [facility] Office Visit from 01/26/2022 in [facility]  Total GAD-7 Score 5 2 2 9 17       PHQ2-9    Flowsheet Row Office Visit from 05/20/2023 in [facility] Office Visit from 08/27/2022 in [facility] Office Visit from 06/16/2022 in [facility] Office Visit from 04/21/2022 in [facility] Office Visit from 04/03/2022 in [facility]  PHQ-2 Total Score 0 0 1 1 1   PHQ-9 Total Score -- 3 5 6  --      Flowsheet Row Office Visit from 06/16/2022 in [facility] Office Visit from 04/21/2022 in [facility] ED from 03/04/2022 in [facility] Department at [facility]  C-SSRS RISK CATEGORY Error: Question 2 not populated Error: Q3, 4, or 5 should not be populated when Q2 is No No Risk        Assessment and Plan:  Felicia Martinez is a  23 y.o. year old female with a  history of depression, who presents for follow up appointment for below.   1. MDD (major depressive disorder), recurrent, in partial remission (HCC) 2. GAD (generalized anxiety disorder) Acute stressors include: broke up with her fiance in 2024 Other stressors include:    History:   She reports overall improvement in depressive symptoms and anxiety since her last visit.  Although she reports stress of starting a new work at [employer], she enjoys Producer, television/film/video new.  Will continue the current dose of bupropion  to target depression.   3. Attention deficit hyperactivity disorder (ADHD), unspecified ADHD type - could not afford neuropsych testing. UDS negative 10/2023 Stable.  She reports significant benefit from the current medication regimen.  Will continue current dose of Ritalin  to target ADHD.   4. Insomnia, unspecified type - sleep test was negative for sleep apnea per her report Stable.  She had adverse reaction of drowsiness from doxepin .  Will hold this medication.  Given her history of marijuana use, Z-drugs, and DORAs, these options may not be ideal due to potential concerns regarding dependence.  She feels comfortable without hypnotics for now.     Plan Continue bupropion  450 mg daily  Continue Ritalin  5 mg twice a day  Next appointment- 7/14 at 4:30, IP   Past trials of medication: sertraline, lexapro  (anxiety), venlafaxine  (vomiting/fever),  Concerta , Abilify (weight gain) Trazodone, melatonin (felt sad)   The patient demonstrates the following risk factors for suicide: Chronic risk factors for suicide include: psychiatric disorder of depression. Acute risk factors for suicide include: N/A. Protective factors for this patient include: positive social support, coping skills and hope for the future. Considering these factors, the overall suicide risk at this point appears to be low. Patient is appropriate for outpatient follow up    Collaboration of Care: Collaboration of Care: Other reviewed  notes in Epic  Patient/Guardian was advised Release of Information must be obtained prior to any record release in order to collaborate their care with an outside provider. Patient/Guardian was advised if they have not already done so to contact the registration department to sign all necessary forms in order for us  to release information regarding their care.   Consent: Patient/Guardian gives verbal consent for treatment and assignment of benefits for services provided during this visit. Patient/Guardian expressed understanding and agreed to proceed.    Todd Fossa, MD 11/15/2023, 4:07 PM

## 2023-11-14 LAB — MED LIST OPTION NOT SELECTED

## 2023-11-15 ENCOUNTER — Ambulatory Visit (INDEPENDENT_AMBULATORY_CARE_PROVIDER_SITE_OTHER): Admitting: Psychiatry

## 2023-11-15 ENCOUNTER — Other Ambulatory Visit: Payer: Self-pay

## 2023-11-15 ENCOUNTER — Ambulatory Visit: Payer: Self-pay | Admitting: Psychiatry

## 2023-11-15 ENCOUNTER — Encounter: Payer: Self-pay | Admitting: Psychiatry

## 2023-11-15 VITALS — BP 111/80 | HR 85 | Temp 97.5°F | Ht 62.0 in | Wt 158.8 lb

## 2023-11-15 DIAGNOSIS — F909 Attention-deficit hyperactivity disorder, unspecified type: Secondary | ICD-10-CM | POA: Diagnosis not present

## 2023-11-15 DIAGNOSIS — F411 Generalized anxiety disorder: Secondary | ICD-10-CM

## 2023-11-15 DIAGNOSIS — F3341 Major depressive disorder, recurrent, in partial remission: Secondary | ICD-10-CM | POA: Diagnosis not present

## 2023-11-15 LAB — MONITOR DRUG PROFILE 10(MW)
Amphetamine Scrn, Ur: NEGATIVE ng/mL
BARBITURATE SCREEN URINE: NEGATIVE ng/mL
BENZODIAZEPINE SCREEN, URINE: NEGATIVE ng/mL
CANNABINOIDS UR QL SCN: NEGATIVE ng/mL
Cocaine (Metab) Scrn, Ur: NEGATIVE ng/mL
Creatinine(Crt), U: 241.1 mg/dL (ref 20.0–300.0)
Methadone Screen, Urine: NEGATIVE ng/mL
OXYCODONE+OXYMORPHONE UR QL SCN: NEGATIVE ng/mL
Opiate Scrn, Ur: NEGATIVE ng/mL
Ph of Urine: 5.7 (ref 4.5–8.9)
Phencyclidine Qn, Ur: NEGATIVE ng/mL
Propoxyphene Scrn, Ur: NEGATIVE ng/mL

## 2023-11-15 MED ORDER — BUPROPION HCL ER (XL) 150 MG PO TB24: 150.0000 mg | ORAL_TABLET | Freq: Every day | ORAL | 1 refills | Status: AC

## 2023-11-15 MED ORDER — METHYLPHENIDATE HCL 5 MG PO TABS: 5.0000 mg | ORAL_TABLET | Freq: Two times a day (BID) | ORAL | 0 refills | Status: AC

## 2023-11-15 MED ORDER — BUPROPION HCL ER (XL) 300 MG PO TB24: 300.0000 mg | ORAL_TABLET | Freq: Every day | ORAL | 1 refills | Status: AC

## 2023-11-15 NOTE — Patient Instructions (Signed)
 Continue bupropion  450 mg daily  Continue Ritalin  5 mg twice a day  Next appointment- 7/14 at 4:30

## 2024-01-07 NOTE — Progress Notes (Unsigned)
 [facility] MD/PA/NP OP Progress Note  01/10/2024 5:27 PM Felicia Martinez  MRN:  [id number]  Chief Complaint:  Chief Complaint  Patient presents with   Follow-up   HPI:  This is a follow-up appointment for depression, anxiety, ADHD.  She states that she will be visiting her boyfriend in [place]  from August 19 th through November, Thanksgiving.  They have been in a relationship in the last 2 months.  They met on video game.  He visited her, and was able to meet her mother.  Although she was not thinking of the relationship, they share the same value, and it has been going well.  She is hoping to finish school, and her boyfriend is supportive of this.  He is aware of the medication she takes.  They like to go to the gym.  She ran out last week, not recognizing there is a medication refill at the pharmacy.  She agrees to contact the office if she has any issues with filling the medication.  She feels that her head is all over the place, and is struggling to stay on tasks.  She feels a lot of things going on in her head.  She believes the first dose of Ritalin  is like a Advertising copywriter, and she takes additional dose as she crashes around 2 pm.  She thinks her mood is good.  She denies feeling depressed.  She may have moments of anxiety, irritability around menstrual cycle.  She sleeps up to 7 hours.  She is not surprised by the weight gain as she has been taking protein, and she goes to the gym 4 days a week.  She denies SI, HI, hallucinations.  She agrees with the plans as outlined below.   Wt Readings from Last 3 Encounters:  01/10/24 170 lb 6.4 oz (77.3 kg)  11/15/23 158 lb 12.8 oz (72 kg)  10/05/23 158 lb 3.2 oz (71.8 kg)      Substance use   Tobacco Alcohol Other substances/  Current   Only occasionally Drank Two bottles of wine on her birthday Denies since Oct 2024  Past denies occasional Weed/marijuana every day  Past Treatment             Visit Diagnosis:    ICD-10-CM   1. MDD (major depressive  disorder), recurrent, in partial remission (HCC)  F33.41     2. GAD (generalized anxiety disorder)  F41.1     3. Attention deficit hyperactivity disorder (ADHD), unspecified ADHD type  F90.9       Past Psychiatric History: Please see initial evaluation for full details. I have reviewed the history. No updates at this time.     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)     Past Surgical History:  Procedure Laterality Date   CHOLECYSTECTOMY N/A 04/21/2021   Procedure: LAPAROSCOPIC CHOLECYSTECTOMY;  Surgeon: Mavis Anes, MD;  Location: [facility];  Service: General;  Laterality: N/A;    Family Psychiatric History: Please see initial evaluation for full details. I have reviewed the history. No updates at this time.     Family History:  Family History  Problem Relation Age of Onset   Hypertension Mother    Anxiety disorder Mother    Depression Mother    Lung cancer Maternal Grandmother    Schizophrenia Maternal Aunt     Social History:  Social History   Socioeconomic History   Marital status: Single    Spouse name: Not on file   Number of children: Not on file   Years of education: Not on file   Highest education level: Some college, no degree  Occupational History   Not on file  Tobacco Use   Smoking status: Never   Smokeless tobacco: Never  Vaping Use   Vaping status: Former   Substances: CBD  Substance and Sexual Activity   Alcohol use: Yes    Comment: occasional   Drug use: Not Currently    Types: Marijuana   Sexual activity: Yes    Birth control/protection: Condom    Comment: menarch age 72.  Lives with mom.  Parents separated.  Other Topics Concern   Not on file  Social History Narrative   In 9th grade at [employer]. She is performing good in school.  No regular exercise.  More than 3 hours of screen time per day.  Enjoys watching  netflix, writing poetry and talking to friends.    Social Drivers of Corporate investment banker Strain: Low Risk  (07/25/2023)   Overall Financial Resource Strain (CARDIA)    Difficulty of Paying Living Expenses: Not very hard  Food Insecurity: Food Insecurity Present (07/25/2023)   Hunger Vital Sign    Worried About Running Out of Food in the Last Year: Sometimes true    Ran Out of Food in the Last Year: Never true  Transportation Needs: No Transportation Needs (07/25/2023)   PRAPARE - Administrator, Civil Service (Medical): No    Lack of Transportation (Non-Medical): No  Physical Activity: Sufficiently Active (07/25/2023)   Exercise Vital Sign    Days of Exercise per Week: 5 days    Minutes of Exercise per Session: 40 min  Stress: No Stress Concern Present (07/25/2023)   [employer] of Occupational Health - Occupational Stress Questionnaire    Feeling of Stress : Not at all  Social Connections: Socially Isolated (07/25/2023)   Social Connection and Isolation Panel    Frequency of Communication with Friends and Family: More than three times a week    Frequency of Social Gatherings with Friends and Family: Never    Attends Religious Services: Never    Database administrator or Organizations: No    Attends Engineer, structural: Not on file    Marital Status: Never married    Allergies:  Allergies  Allergen Reactions   Other     Seasonal Allergies-pollen   Venlafaxine  Hcl Nausea And Vomiting    Vomiting, hallucinations, fever-like reactions with concomitant use of bupropion     Metabolic Disorder Labs: Lab Results  Component Value Date   HGBA1C 5.0 02/16/2022   MPG 97 02/16/2022   MPG 108 08/06/2020   Lab Results  Component Value Date   PROLACTIN 8.8 01/17/2015   Lab Results  Component Value Date   CHOL 230 (H) 02/16/2022   TRIG 210 (H) 02/16/2022   HDL 48 (L) 02/16/2022   CHOLHDL 4.8 02/16/2022   VLDL 35 (H) 08/02/2015   LDLCALC 147 (H)  02/16/2022   LDLCALC 100 10/18/2019   Lab Results  Component Value Date   TSH 0.738 12/25/2021   TSH 2.280 03/17/2021    Therapeutic Level Labs: No results found for: LITHIUM No results found for: VALPROATE No results found for: CBMZ  Current Medications: Current Outpatient Medications  Medication Sig Dispense Refill   albuterol  (VENTOLIN  HFA) 108 (  90 Base) MCG/ACT inhaler Inhale 2 puffs into the lungs every 6 (six) hours as needed for wheezing or shortness of breath. 8 g 0   buPROPion  (WELLBUTRIN  XL) 150 MG 24 hr tablet Take 1 tablet (150 mg total) by mouth daily. Take total of 450 mg daily. Take along with 300 mg tab 90 tablet 1   buPROPion  (WELLBUTRIN  XL) 300 MG 24 hr tablet Take 1 tablet (300 mg total) by mouth daily. Take total of 450 mg daily. Take along with 150 mg tab 90 tablet 1   methylphenidate  (RITALIN ) 5 MG tablet Take 1 tablet (5 mg total) by mouth 2 (two) times daily. 60 tablet 0   methylphenidate  (RITALIN ) 5 MG tablet Take 1 tablet (5 mg total) by mouth 2 (two) times daily. 60 tablet 0   [START ON 04/09/2024] methylphenidate  (RITALIN ) 5 MG tablet Take 1 tablet (5 mg total) by mouth 2 (two) times daily. 60 tablet 0   [START ON 02/09/2024] methylphenidate  (RITALIN ) 5 MG tablet Take 1 tablet (5 mg total) by mouth 2 (two) times daily. 60 tablet 0   [START ON 03/10/2024] methylphenidate  (RITALIN ) 5 MG tablet Take 1 tablet (5 mg total) by mouth 2 (two) times daily. 60 tablet 0   [START ON 05/09/2024] methylphenidate  (RITALIN ) 5 MG tablet Take 1 tablet (5 mg total) by mouth 2 (two) times daily. 60 tablet 0   Omega-3 Fatty Acids (FISH OIL PO) Take by mouth.     sulfamethoxazole -trimethoprim  (BACTRIM  DS) 800-160 MG tablet Take 1 tablet by mouth 2 (two) times daily. 14 tablet 0   No current facility-administered medications for this visit.     Musculoskeletal: Strength & Muscle Tone: within normal limits Gait & Station: normal Patient leans: N/A  Psychiatric Specialty  Exam: Review of Systems  Psychiatric/Behavioral: Negative.    All other systems reviewed and are negative.   Blood pressure 118/76, pulse 92, temperature 98.9 F (37.2 C), temperature source Temporal, height 5' 2 (1.575 m), weight 170 lb 6.4 oz (77.3 kg), last menstrual period 01/03/2024, SpO2 100%.Body mass index is 31.17 kg/m.  General Appearance: Well Groomed  Eye Contact:  Good  Speech:  Clear and Coherent  Volume:  Normal  Mood:  good  Affect:  Appropriate, Congruent, and Full Range  Thought Process:  Coherent  Orientation:  Full (Time, Place, and Person)  Thought Content: Logical   Suicidal Thoughts:  No  Homicidal Thoughts:  No  Memory:  Immediate;   Good  Judgement:  Good  Insight:  Good  Psychomotor Activity:  Normal  Concentration:  Concentration: Good and Attention Span: Good  Recall:  Good  Fund of Knowledge: Good  Language: Good  Akathisia:  No  Handed:  Right  AIMS (if indicated): not done  Assets:  Communication Skills Desire for Improvement  ADL's:  Intact  Cognition: WNL  Sleep:  Good   Screenings: GAD-7    Flowsheet Row Office Visit from 08/27/2022 in [facility] Office Visit from 06/16/2022 in [facility] Office Visit from 04/21/2022 in [facility] Office Visit from 03/25/2022 in [facility] Office Visit from 01/26/2022 in [facility]  Total GAD-7 Score 5 2 2 9 17    PHQ2-9    Flowsheet Row Office Visit from 05/20/2023 in [facility] Office Visit from 08/27/2022 in [facility] Office Visit from  06/16/2022 in [facility] Office Visit from 04/21/2022 in [facility] Office Visit from 04/03/2022 in [facility]  PHQ-2 Total Score 0 0 1 1 1   PHQ-9 Total Score -- 3 5 6  --   Flowsheet Row Office Visit from 06/16/2022 in [facility] Office Visit from 04/21/2022 in [facility] ED from 03/04/2022 in [facility] Department at [facility]  C-SSRS RISK CATEGORY Error: Question 2 not populated Error: Q3, 4, or 5 should not be populated when Q2 is No No Risk     Assessment and Plan:  Felicia Martinez is a 23 y.o. year old female with a history of depression, who presents for follow up appointment for below.    1. MDD (major depressive disorder), recurrent, in partial remission (HCC) 2. GAD (generalized anxiety disorder) She has any significant mood symptoms since the last visit.  She will be visiting her boyfriend in [place] , and is hoping to finish the school.  Will continue current dose of bupropion  to target depression.   3. Attention deficit hyperactivity disorder (ADHD), unspecified ADHD type - could not afford neuropsych testing. UDS negative 10/2023  Stable except she struggles with inattention when she runs out of Ritalin .  She reports significant benefit from the current medication regimen.  Will continue Ritalin  to target ADHD.  Noted that she will be leaving to stay with her boyfriend in [place]  until Thanksgiving.  Will prescribe the medication to last until the next visit.  Prescription Monitoring Program (PMP) was reviewed and indicates adherence to prescribed regimen with no concerning refill patterns.     4. Insomnia, unspecified type - sleep test was negative for sleep apnea per her report Stable. Will continue to assess as needed.   Plan Continue bupropion  450 mg daily  Continue Ritalin  5 mg twice a day - she has a month refill left. Will plan to prescribe until next visit/allow sooner refill as she will be out of state/[place] .  She expressed understanding that  medication will not be prescribed out of the state.  Next appointment- 12/1 at 4:30, IP   Past trials of medication: sertraline, lexapro  (anxiety), venlafaxine  (vomiting/fever),  Concerta , Abilify (weight gain) Trazodone, doxepin , melatonin (felt sad)   The patient demonstrates the following risk factors for suicide: Chronic risk factors for suicide include: psychiatric disorder of depression. Acute risk factors for suicide include: N/A. Protective factors for this patient include: positive social support, coping skills and hope for the future. Considering these factors, the overall suicide risk at this point appears to be low. Patient is appropriate for outpatient follow up    Collaboration of Care: Collaboration of Care: Other reviewed notes in Epic  Patient/Guardian was advised Release of Information must be obtained prior to any record release in order to collaborate their care with an outside provider. Patient/Guardian was advised if they have not already done so to contact the registration department to sign all necessary forms in order for us  to release information regarding their care.   Consent: Patient/Guardian gives verbal consent for treatment and assignment of benefits for services provided during this visit. Patient/Guardian expressed understanding and agreed to proceed.    Katheren Sleet, MD 01/10/2024, 5:27 PM

## 2024-01-10 ENCOUNTER — Encounter: Payer: Self-pay | Admitting: Psychiatry

## 2024-01-10 ENCOUNTER — Ambulatory Visit (INDEPENDENT_AMBULATORY_CARE_PROVIDER_SITE_OTHER): Admitting: Psychiatry

## 2024-01-10 VITALS — BP 118/76 | HR 92 | Temp 98.9°F | Ht 62.0 in | Wt 170.4 lb

## 2024-01-10 DIAGNOSIS — F909 Attention-deficit hyperactivity disorder, unspecified type: Secondary | ICD-10-CM | POA: Diagnosis not present

## 2024-01-10 DIAGNOSIS — F411 Generalized anxiety disorder: Secondary | ICD-10-CM | POA: Diagnosis not present

## 2024-01-10 DIAGNOSIS — F3341 Major depressive disorder, recurrent, in partial remission: Secondary | ICD-10-CM

## 2024-01-10 MED ORDER — METHYLPHENIDATE HCL 5 MG PO TABS: 5.0000 mg | ORAL_TABLET | Freq: Two times a day (BID) | ORAL | 0 refills | Status: AC

## 2024-01-10 NOTE — Patient Instructions (Signed)
 Continue bupropion  450 mg daily  Continue Ritalin  5 mg twice a day  Next appointment- 12/1 at 4:30

## 2024-01-14 ENCOUNTER — Other Ambulatory Visit: Payer: Self-pay | Admitting: Family Medicine

## 2024-01-14 DIAGNOSIS — R053 Chronic cough: Secondary | ICD-10-CM

## 2024-01-17 NOTE — Telephone Encounter (Signed)
 Requested Prescriptions  Pending Prescriptions Disp Refills   albuterol  (VENTOLIN  HFA) 108 (90 Base) MCG/ACT inhaler [Pharmacy Med Name: VENTOLIN  HFA 90 MCG INHALER] 18 each 0    Sig: TAKE 2 PUFFS BY MOUTH EVERY 6 HOURS AS NEEDED FOR WHEEZE OR SHORTNESS OF BREATH     Pulmonology:  Beta Agonists 2 Passed - 01/17/2024  4:03 PM      Passed - Last BP in normal range    BP Readings from Last 1 Encounters:  07/26/23 112/62         Passed - Last Heart Rate in normal range    Pulse Readings from Last 1 Encounters:  07/26/23 72         Passed - Valid encounter within last 12 months    Recent Outpatient Visits           5 months ago Urinary frequency   [place] [facility] Duanne Butler DASEN, MD   8 months ago Acute vaginitis   [place] [facility] Duanne, Butler DASEN, MD   1 year ago Ganglion cyst of dorsum of right wrist   [place] [facility] Duanne Butler DASEN, MD   1 year ago General medical exam   [place] [facility] Pick[place], Butler DASEN, MD

## 2024-02-17 ENCOUNTER — Other Ambulatory Visit: Payer: Self-pay

## 2024-02-17 ENCOUNTER — Telehealth: Payer: Self-pay | Admitting: Family Medicine

## 2024-02-17 DIAGNOSIS — R053 Chronic cough: Secondary | ICD-10-CM

## 2024-02-17 MED ORDER — ALBUTEROL SULFATE HFA 108 (90 BASE) MCG/ACT IN AERS: 2.0000 | INHALATION_SPRAY | Freq: Four times a day (QID) | RESPIRATORY_TRACT | 0 refills | Status: AC | PRN

## 2024-02-17 NOTE — Telephone Encounter (Signed)
 Prescription Request  02/17/2024  LOV: 07/26/2023  What is the name of the medication or equipment?   albuterol  (VENTOLIN  HFA) 108 (90 Base) MCG/ACT inhaler SIG: TAKE 2 PUFFS BY MOUTH EVERY 6 HOURS AS NEEDED FOR WHEEZE OR SHORTNESS OF BREATH    Have you contacted your pharmacy to request a refill? Yes   Which pharmacy would you like this sent to?  [id number] - [place], [place] - [address] AT [facility] [address] [place] [address] Phone: [phone] Fax: [phone]    Patient notified that their request is being sent to the clinical staff for review and that they should receive a response within 2 business days.   Please advise pharmacist

## 2024-05-20 NOTE — Progress Notes (Deleted)
 [facility] MD/PA/NP OP Progress Note  05/20/2024 1:35 PM CHINMAYI RUMER  MRN:  [id number]  Chief Complaint: No chief complaint on file.  HPI: *** Visit Diagnosis: No diagnosis found.  Past Psychiatric History: Please see initial evaluation for full details. I have reviewed the history. No updates at this time.     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 I have reviewed the history. No updates at this time.     Family History:  Family History  Problem Relation Age of Onset   Hypertension Mother    Anxiety disorder Mother    Depression Mother    Lung cancer Maternal Grandmother    Schizophrenia Maternal Aunt     Social History:  Social History   Socioeconomic History   Marital status: Single    Spouse name: Not on file   Number of children: Not on file   Years of education: Not on file   Highest education level: Some college, no degree  Occupational History   Not on file  Tobacco Use   Smoking status: Never   Smokeless tobacco: Never  Vaping Use   Vaping status: Former   Substances: CBD  Substance and Sexual Activity   Alcohol use: Yes    Comment: occasional   Drug use: Not Currently    Types: Marijuana   Sexual activity: Yes    Birth control/protection: Condom    Comment: menarch age 51.  Lives with mom.  Parents separated.  Other Topics Concern   Not on file  Social History Narrative   In 9th grade at [employer]. She is performing good in school.  No regular exercise.  More than 3 hours of screen time per day.  Enjoys watching netflix, writing  poetry and talking to friends.    Social Drivers of Corporate Investment Banker Strain: Low Risk  (07/25/2023)   Overall Financial Resource Strain (CARDIA)    Difficulty of Paying Living Expenses: Not very hard  Food Insecurity: Food Insecurity Present (07/25/2023)   Hunger Vital Sign    Worried About Running Out of Food in the Last Year: Sometimes true    Ran Out of Food in the Last Year: Never true  Transportation Needs: No Transportation Needs (07/25/2023)   PRAPARE - Administrator, Civil Service (Medical): No    Lack of Transportation (Non-Medical): No  Physical Activity: Sufficiently Active (07/25/2023)   Exercise Vital Sign    Days of Exercise per Week: 5 days    Minutes of Exercise per Session: 40 min  Stress: No Stress Concern Present (07/25/2023)   [employer] of Occupational Health - Occupational Stress Questionnaire    Feeling of Stress : Not at all  Social Connections: Socially Isolated (07/25/2023)   Social Connection and Isolation Panel    Frequency of Communication with Friends and Family: More than three times a week    Frequency of Social Gatherings with Friends and Family: Never    Attends Religious Services: Never    Database Administrator or Organizations: No  Attends Banker Meetings: Not on file    Marital Status: Never married    Allergies:  Allergies  Allergen Reactions   Other     Seasonal Allergies-pollen   Venlafaxine  Hcl Nausea And Vomiting    Vomiting, hallucinations, fever-like reactions with concomitant use of bupropion     Metabolic Disorder Labs: Lab Results  Component Value Date   HGBA1C 5.0 02/16/2022   MPG 97 02/16/2022   MPG 108 08/06/2020   Lab Results  Component Value Date   PROLACTIN 8.8 01/17/2015   Lab Results  Component Value Date   CHOL 230 (H) 02/16/2022   TRIG 210 (H) 02/16/2022   HDL 48 (L) 02/16/2022   CHOLHDL 4.8 02/16/2022   VLDL 35 (H) 08/02/2015   LDLCALC 147 (H) 02/16/2022    LDLCALC 100 10/18/2019   Lab Results  Component Value Date   TSH 0.738 12/25/2021   TSH 2.280 03/17/2021    Therapeutic Level Labs: No results found for: LITHIUM No results found for: VALPROATE No results found for: CBMZ  Current Medications: Current Outpatient Medications  Medication Sig Dispense Refill   albuterol  (VENTOLIN  HFA) 108 (90 Base) MCG/ACT inhaler Inhale 2 puffs into the lungs every 6 (six) hours as needed for wheezing or shortness of breath. APPOINTMENT NEEDED IN OFFICE. 8 g 0   buPROPion  (WELLBUTRIN  XL) 150 MG 24 hr tablet Take 1 tablet (150 mg total) by mouth daily. Take total of 450 mg daily. Take along with 300 mg tab 90 tablet 1   buPROPion  (WELLBUTRIN  XL) 300 MG 24 hr tablet Take 1 tablet (300 mg total) by mouth daily. Take total of 450 mg daily. Take along with 150 mg tab 90 tablet 1   methylphenidate  (RITALIN ) 5 MG tablet Take 1 tablet (5 mg total) by mouth 2 (two) times daily. 60 tablet 0   methylphenidate  (RITALIN ) 5 MG tablet Take 1 tablet (5 mg total) by mouth 2 (two) times daily. 60 tablet 0   methylphenidate  (RITALIN ) 5 MG tablet Take 1 tablet (5 mg total) by mouth 2 (two) times daily. 60 tablet 0   methylphenidate  (RITALIN ) 5 MG tablet Take 1 tablet (5 mg total) by mouth 2 (two) times daily. 60 tablet 0   methylphenidate  (RITALIN ) 5 MG tablet Take 1 tablet (5 mg total) by mouth 2 (two) times daily. 60 tablet 0   methylphenidate  (RITALIN ) 5 MG tablet Take 1 tablet (5 mg total) by mouth 2 (two) times daily. 60 tablet 0   Omega-3 Fatty Acids (FISH OIL PO) Take by mouth.     sulfamethoxazole -trimethoprim  (BACTRIM  DS) 800-160 MG tablet Take 1 tablet by mouth 2 (two) times daily. 14 tablet 0   No current facility-administered medications for this visit.     Musculoskeletal: Strength & Muscle Tone: normal Gait & Station: normal Patient leans: N/A  Psychiatric Specialty Exam: Review of Systems  There were no vitals taken for this visit.There is no  height or weight on file to calculate BMI.  General Appearance: {Appearance:22683}  Eye Contact:  {BHH EYE CONTACT:22684}  Speech:  Clear and Coherent  Volume:  Normal  Mood:  {BHH MOOD:22306}  Affect:  {Affect (PAA):22687}  Thought Process:  Coherent  Orientation:  Full (Time, Place, and Person)  Thought Content: Logical   Suicidal Thoughts:  {ST/HT (PAA):22692}  Homicidal Thoughts:  {ST/HT (PAA):22692}  Memory:  Immediate;   Good  Judgement:  {Judgement (PAA):22694}  Insight:  {Insight (PAA):22695}  Psychomotor Activity:  Normal  Concentration:  Concentration: Good  and Attention Span: Good  Recall:  Good  Fund of Knowledge: Good  Language: Good  Akathisia:  No  Handed:  Right  AIMS (if indicated): not done  Assets:  Communication Skills Desire for Improvement  ADL's:  Intact  Cognition: WNL  Sleep:  {BHH GOOD/FAIR/POOR:22877}   Screenings: GAD-7    Flowsheet Row Office Visit from 08/27/2022 in [facility] Office Visit from 06/16/2022 in [facility] Office Visit from 04/21/2022 in [facility] Office Visit from 03/25/2022 in [facility] Office Visit from 01/26/2022 in [facility]  Total GAD-7 Score 5 2 2 9 17    PHQ2-9    Flowsheet Row Office Visit from 05/20/2023 in [facility] Office Visit from 08/27/2022 in [facility] Office Visit from 06/16/2022 in [facility] Office Visit from 04/21/2022 in [facility] Office Visit from 04/03/2022 in [facility]  PHQ-2 Total Score 0 0 1 1 1   PHQ-9 Total Score -- 3 5 6  --   Flowsheet Row Office Visit from 06/16/2022 in [facility] Office Visit from 04/21/2022 in [facility] ED from 03/04/2022 in [facility] Department at [facility]  C-SSRS RISK CATEGORY Error: Question 2 not populated Error: Q3, 4, or 5 should not be populated when Q2 is No No Risk     Assessment and Plan:  TAMMA BRIGANDI is a 23 y.o. year old female with a history of depression, who presents for follow up appointment for below.    1. MDD (major depressive disorder), recurrent, in partial remission (HCC) 2. GAD (generalized anxiety disorder) She has any significant mood symptoms since the last visit.  She will be visiting her boyfriend in [place] , and is hoping to finish the school.  Will continue current dose of bupropion  to target depression.    3. Attention deficit hyperactivity disorder (ADHD), unspecified ADHD type - could not afford neuropsych testing. UDS negative 10/2023  Stable except she struggles with inattention when she runs out of Ritalin .  She reports significant benefit from the current medication regimen.  Will continue Ritalin  to target ADHD.  Noted that she will be leaving to stay with her boyfriend in [place]  until Thanksgiving.  Will prescribe the medication to last until the next visit.  Prescription Monitoring Program (PMP) was reviewed and indicates adherence to prescribed regimen with no concerning refill patterns.     4. Insomnia, unspecified type - sleep test was negative for sleep apnea per her report Stable. Will continue to assess as needed.    Plan Continue bupropion  450 mg daily  Continue Ritalin  5 mg twice a day - she has a month refill left. Will plan to prescribe until next visit/allow sooner refill as she will be out of state/[place] .  She expressed understanding that medication will not be prescribed out of the state.  Next appointment- 12/1 at 4:30, IP   Past trials of medication: sertraline, lexapro  (anxiety), venlafaxine  (vomiting/fever),   Concerta , Abilify (weight gain) Trazodone, doxepin , melatonin (felt sad)   The patient demonstrates the following risk factors for suicide: Chronic risk factors for suicide include: psychiatric disorder of depression. Acute risk factors for suicide include: N/A. Protective factors for this patient include: positive social support, coping skills and  hope for the future. Considering these factors, the overall suicide risk at this point appears to be low. Patient is appropriate for outpatient follow up      Collaboration of Care: Collaboration of Care: [facility] OP Collaboration of Rjmz:[id number]}  Patient/Guardian was advised Release of Information must be obtained prior to any record release in order to collaborate their care with an outside provider. Patient/Guardian was advised if they have not already done so to contact the registration department to sign all necessary forms in order for us  to release information regarding their care.   Consent: Patient/Guardian gives verbal consent for treatment and assignment of benefits for services provided during this visit. Patient/Guardian expressed understanding and agreed to proceed.    Katheren Sleet, MD 05/20/2024, 1:35 PM

## 2024-05-24 ENCOUNTER — Telehealth: Payer: Self-pay | Admitting: Psychiatry

## 2024-05-24 NOTE — Telephone Encounter (Signed)
 Patient left a voice message stating to cancel her 05-29-24 appointment. She is in the process of moving to [place] . She did not specify if she needed refills

## 2024-05-29 ENCOUNTER — Ambulatory Visit: Admitting: Psychiatry

## 2024-06-30 ENCOUNTER — Telehealth: Payer: Self-pay

## 2024-06-30 NOTE — Telephone Encounter (Signed)
 I have not seen her since last July, and I cannot do any more refill without evaluation. If there is any need, please advise her to reach out to her primary care for refills.

## 2024-06-30 NOTE — Telephone Encounter (Signed)
 Pt calling to request Refills as she hasn't not moved yet but is in the process and wanted medication to hold her over until she finds a provider in [place]  buPROPion  (WELLBUTRIN  XL) 300 MG 24 hr tablet  buPROPion  (WELLBUTRIN  XL) 150 MG 24 hr tablet  methylphenidate  (RITALIN ) 5 MG tablet   Pharmacy:[id number] - [place], [place] - [address] AT [address]  Last seen:01/10/24 Next apt: None

## 2024-07-03 NOTE — Telephone Encounter (Signed)
 Unable to lvm as vm not connected.

## 2024-07-11 ENCOUNTER — Other Ambulatory Visit: Payer: Self-pay | Admitting: Psychiatry

## 2024-07-31 NOTE — Telephone Encounter (Signed)
 Pt aware, called back I let her know message from provider
